# Patient Record
Sex: Male | Born: 1945 | Race: White | Hispanic: No | Marital: Married | Smoking: Former smoker
Health system: Southern US, Community
[De-identification: ages and names within clinical notes are randomized; demographics above are authoritative.]

## PROBLEM LIST (undated history)

## (undated) ENCOUNTER — Emergency Department (HOSPITAL_COMMUNITY): Admission: EM | Payer: Medicare PPO | Source: Home / Self Care

## (undated) DIAGNOSIS — I639 Cerebral infarction, unspecified: Secondary | ICD-10-CM

## (undated) DIAGNOSIS — Z9289 Personal history of other medical treatment: Secondary | ICD-10-CM

## (undated) DIAGNOSIS — I2510 Atherosclerotic heart disease of native coronary artery without angina pectoris: Secondary | ICD-10-CM

## (undated) DIAGNOSIS — I10 Essential (primary) hypertension: Secondary | ICD-10-CM

## (undated) DIAGNOSIS — L409 Psoriasis, unspecified: Secondary | ICD-10-CM

## (undated) DIAGNOSIS — C189 Malignant neoplasm of colon, unspecified: Secondary | ICD-10-CM

## (undated) DIAGNOSIS — E785 Hyperlipidemia, unspecified: Secondary | ICD-10-CM

## (undated) DIAGNOSIS — K5790 Diverticulosis of intestine, part unspecified, without perforation or abscess without bleeding: Secondary | ICD-10-CM

## (undated) HISTORY — DX: Psoriasis, unspecified: L40.9

## (undated) HISTORY — DX: Diverticulosis of intestine, part unspecified, without perforation or abscess without bleeding: K57.90

## (undated) HISTORY — DX: Hyperlipidemia, unspecified: E78.5

## (undated) HISTORY — DX: Personal history of other medical treatment: Z92.89

## (undated) HISTORY — DX: Essential (primary) hypertension: I10

## (undated) HISTORY — DX: Cerebral infarction, unspecified: I63.9

## (undated) HISTORY — DX: Atherosclerotic heart disease of native coronary artery without angina pectoris: I25.10

## (undated) HISTORY — DX: Malignant neoplasm of colon, unspecified: C18.9

---

## 1999-06-21 ENCOUNTER — Ambulatory Visit (HOSPITAL_COMMUNITY): Admission: RE | Admit: 1999-06-21 | Discharge: 1999-06-21 | Payer: Self-pay | Admitting: Family Medicine

## 1999-07-11 ENCOUNTER — Ambulatory Visit (HOSPITAL_COMMUNITY): Admission: RE | Admit: 1999-07-11 | Discharge: 1999-07-11 | Payer: Self-pay | Admitting: *Deleted

## 2001-01-24 ENCOUNTER — Emergency Department (HOSPITAL_COMMUNITY): Admission: EM | Admit: 2001-01-24 | Discharge: 2001-01-24 | Payer: Self-pay | Admitting: *Deleted

## 2001-01-24 ENCOUNTER — Encounter: Payer: Self-pay | Admitting: Emergency Medicine

## 2001-01-27 ENCOUNTER — Encounter: Payer: Self-pay | Admitting: Family Medicine

## 2001-01-27 ENCOUNTER — Encounter: Payer: Self-pay | Admitting: Neurology

## 2001-01-27 ENCOUNTER — Inpatient Hospital Stay (HOSPITAL_COMMUNITY): Admission: AD | Admit: 2001-01-27 | Discharge: 2001-01-28 | Payer: Self-pay | Admitting: Family Medicine

## 2001-01-28 ENCOUNTER — Encounter: Payer: Self-pay | Admitting: Neurology

## 2001-08-12 ENCOUNTER — Encounter (INDEPENDENT_AMBULATORY_CARE_PROVIDER_SITE_OTHER): Payer: Self-pay

## 2001-08-12 ENCOUNTER — Other Ambulatory Visit: Admission: RE | Admit: 2001-08-12 | Discharge: 2001-08-12 | Payer: Self-pay | Admitting: Urology

## 2002-10-28 ENCOUNTER — Observation Stay (HOSPITAL_COMMUNITY): Admission: EM | Admit: 2002-10-28 | Discharge: 2002-10-29 | Payer: Self-pay

## 2002-10-29 ENCOUNTER — Encounter: Payer: Self-pay | Admitting: *Deleted

## 2003-05-09 ENCOUNTER — Encounter (INDEPENDENT_AMBULATORY_CARE_PROVIDER_SITE_OTHER): Payer: Self-pay

## 2003-05-09 ENCOUNTER — Ambulatory Visit (HOSPITAL_COMMUNITY): Admission: RE | Admit: 2003-05-09 | Discharge: 2003-05-09 | Payer: Self-pay | Admitting: Gastroenterology

## 2003-05-23 ENCOUNTER — Ambulatory Visit (HOSPITAL_COMMUNITY): Admission: RE | Admit: 2003-05-23 | Discharge: 2003-05-23 | Payer: Self-pay | Admitting: Family Medicine

## 2003-05-23 ENCOUNTER — Encounter: Payer: Self-pay | Admitting: Family Medicine

## 2005-10-05 ENCOUNTER — Emergency Department (HOSPITAL_COMMUNITY): Admission: EM | Admit: 2005-10-05 | Discharge: 2005-10-06 | Payer: Self-pay | Admitting: Emergency Medicine

## 2006-01-15 ENCOUNTER — Encounter: Payer: Self-pay | Admitting: Cardiology

## 2006-01-22 ENCOUNTER — Ambulatory Visit (HOSPITAL_COMMUNITY): Admission: RE | Admit: 2006-01-22 | Discharge: 2006-01-22 | Payer: Self-pay | Admitting: *Deleted

## 2006-01-22 ENCOUNTER — Encounter: Payer: Self-pay | Admitting: Cardiology

## 2006-07-14 ENCOUNTER — Encounter: Admission: RE | Admit: 2006-07-14 | Discharge: 2006-10-12 | Payer: Self-pay | Admitting: Family Medicine

## 2007-10-21 ENCOUNTER — Encounter: Payer: Self-pay | Admitting: Cardiology

## 2008-06-30 ENCOUNTER — Encounter (INDEPENDENT_AMBULATORY_CARE_PROVIDER_SITE_OTHER): Payer: Self-pay | Admitting: General Surgery

## 2008-06-30 ENCOUNTER — Inpatient Hospital Stay (HOSPITAL_COMMUNITY): Admission: RE | Admit: 2008-06-30 | Discharge: 2008-07-04 | Payer: Self-pay | Admitting: General Surgery

## 2008-07-12 ENCOUNTER — Ambulatory Visit: Payer: Self-pay | Admitting: Hematology and Oncology

## 2008-07-19 LAB — CBC WITH DIFFERENTIAL/PLATELET
EOS%: 1 % (ref 0.0–7.0)
Eosinophils Absolute: 0.1 10*3/uL (ref 0.0–0.5)
HGB: 15.5 g/dL (ref 13.0–17.1)
MCV: 93.2 fL (ref 81.6–98.0)
MONO%: 6.9 % (ref 0.0–13.0)
NEUT#: 5.7 10*3/uL (ref 1.5–6.5)
RBC: 4.82 10*6/uL (ref 4.20–5.71)
RDW: 12.9 % (ref 11.2–14.6)
lymph#: 1.8 10*3/uL (ref 0.9–3.3)

## 2008-07-20 LAB — COMPREHENSIVE METABOLIC PANEL
ALT: 20 U/L (ref 0–53)
Albumin: 4.3 g/dL (ref 3.5–5.2)
CO2: 26 mEq/L (ref 19–32)
Calcium: 9.9 mg/dL (ref 8.4–10.5)
Chloride: 103 mEq/L (ref 96–112)
Glucose, Bld: 96 mg/dL (ref 70–99)
Sodium: 142 mEq/L (ref 135–145)
Total Protein: 7.1 g/dL (ref 6.0–8.3)

## 2008-07-20 LAB — CEA: CEA: 0.8 ng/mL (ref 0.0–5.0)

## 2008-07-27 ENCOUNTER — Ambulatory Visit (HOSPITAL_COMMUNITY): Admission: RE | Admit: 2008-07-27 | Discharge: 2008-07-27 | Payer: Self-pay | Admitting: Hematology and Oncology

## 2009-05-07 ENCOUNTER — Emergency Department (HOSPITAL_COMMUNITY): Admission: EM | Admit: 2009-05-07 | Discharge: 2009-05-07 | Payer: Self-pay | Admitting: Emergency Medicine

## 2009-06-12 ENCOUNTER — Telehealth (INDEPENDENT_AMBULATORY_CARE_PROVIDER_SITE_OTHER): Payer: Self-pay | Admitting: *Deleted

## 2009-06-26 ENCOUNTER — Ambulatory Visit: Payer: Self-pay | Admitting: Cardiology

## 2009-06-26 DIAGNOSIS — G459 Transient cerebral ischemic attack, unspecified: Secondary | ICD-10-CM | POA: Insufficient documentation

## 2009-06-26 DIAGNOSIS — I10 Essential (primary) hypertension: Secondary | ICD-10-CM | POA: Insufficient documentation

## 2009-06-26 DIAGNOSIS — I2510 Atherosclerotic heart disease of native coronary artery without angina pectoris: Secondary | ICD-10-CM | POA: Insufficient documentation

## 2009-06-26 DIAGNOSIS — E785 Hyperlipidemia, unspecified: Secondary | ICD-10-CM | POA: Insufficient documentation

## 2009-06-26 DIAGNOSIS — I6350 Cerebral infarction due to unspecified occlusion or stenosis of unspecified cerebral artery: Secondary | ICD-10-CM | POA: Insufficient documentation

## 2009-06-30 ENCOUNTER — Ambulatory Visit: Payer: Self-pay

## 2009-06-30 ENCOUNTER — Encounter: Payer: Self-pay | Admitting: Cardiology

## 2009-07-03 ENCOUNTER — Encounter: Payer: Self-pay | Admitting: Cardiology

## 2009-07-06 ENCOUNTER — Encounter: Payer: Self-pay | Admitting: Cardiology

## 2009-07-06 ENCOUNTER — Ambulatory Visit: Payer: Self-pay | Admitting: Cardiology

## 2009-07-06 ENCOUNTER — Ambulatory Visit (HOSPITAL_COMMUNITY): Admission: RE | Admit: 2009-07-06 | Discharge: 2009-07-06 | Payer: Self-pay | Admitting: Cardiology

## 2009-09-22 ENCOUNTER — Encounter: Payer: Self-pay | Admitting: Cardiology

## 2009-10-16 ENCOUNTER — Telehealth (INDEPENDENT_AMBULATORY_CARE_PROVIDER_SITE_OTHER): Payer: Self-pay | Admitting: *Deleted

## 2009-10-17 ENCOUNTER — Encounter: Admission: RE | Admit: 2009-10-17 | Discharge: 2009-10-17 | Payer: Self-pay | Admitting: Family Medicine

## 2010-06-11 ENCOUNTER — Encounter: Payer: Self-pay | Admitting: Cardiology

## 2010-06-12 ENCOUNTER — Encounter: Payer: Self-pay | Admitting: Cardiology

## 2010-06-14 ENCOUNTER — Ambulatory Visit: Payer: Self-pay | Admitting: Cardiology

## 2010-06-14 ENCOUNTER — Telehealth: Payer: Self-pay | Admitting: Cardiology

## 2010-07-02 ENCOUNTER — Ambulatory Visit: Payer: Self-pay | Admitting: Cardiology

## 2010-07-03 ENCOUNTER — Telehealth: Payer: Self-pay | Admitting: Cardiology

## 2010-07-10 LAB — CONVERTED CEMR LAB: CO2: 32 meq/L (ref 19–32)

## 2010-11-04 ENCOUNTER — Encounter: Payer: Self-pay | Admitting: Family Medicine

## 2010-11-15 NOTE — Assessment & Plan Note (Signed)
Summary: per check out/sf   Visit Type:  Follow-up Primary Provider:  Shaune Pollack, MD  CC:  no omplaints.  History of Present Illness: 65 yo with history of prior cerebellar CVA, nonobstructive CAD, HTN, hyperlipidemia presents for cardiology followup.  Patient has a history of cerebellar CVA in 2000, documented by MRI.  Symptoms at that time were difficulty with balace and discoordination.  No vertigo at that time.  In 7/10, pt had just gotten dressed for church one Sunday and was not doing anything in particular.  He developed a severe spinning sensation like vertigo that lasted for about 5 minutes.  He was "woozy" for about 1.5 hours after that before finally returning to normal.  He does not remember having the vertigo set off by moving his head in a particular direction.  He went to the ER and had an MRI of his head done, showing only small vessel disease.  There was some concern that this could have been a posterior circulation TIA given prior history of cerebellar CVA.  He did have a TEE in 9/10 showing no PFO, normal LV and RV systolic function, grade III plaque in the aortic arch/descending thoracic aorta.   Patient has had no TIA-like episodes since I saw him last.  He has been doing well.  BP seems to be reasonably controlled, it is 130/80 today.  He is walking 2 miltes 3-4 times a week.  No exertional chest pain or dyspnea.  He recently retired.  ECG:  NSR, normal  Labs (8/10): creatinine 1.0, LDL 73, HDL 58  Current Medications (verified): 1)  Plavix 75 Mg Tabs (Clopidogrel Bisulfate) .... Once Daily 2)  Teveten 600 Mg Tabs (Eprosartan Mesylate) .... Take One Tablet Once Daily 3)  Hydrochlorothiazide 25 Mg Tabs (Hydrochlorothiazide) .... Take One Tablet Once Daily 4)  Norvasc 5 Mg Tabs (Amlodipine Besylate) .... Take One Tablet Once Daily 5)  Toprol Xl 100 Mg Xr24h-Tab (Metoprolol Succinate) .... Take One Tablet Once Daily 6)  Lipitor 10 Mg Tabs (Atorvastatin Calcium) .... Take At  Bedtime 7)  Buffered Aspirin 325 Mg Tabs (Aspirin Buf(Cacarb-Mgcarb-Mgo)) .... Take One Tablet Once Daily  Allergies (verified): No Known Drug Allergies  Past History:  Past Medical History: 1. Hyperlipidemia 2. Cerebellar CVA 2000: documented by MRI.  Possible TIA 7/10 with vertigo.  MRI 7/10 showed only small vessel disease.  Possible TIA 7/10.  Carotid dopplers (9/10) with no significant disease.  TEE (9/10): Normal LV and RV size and systolic function.  No PFO.  No significant valvular disease.  At least grade III plaque in the arch and descending thoracic aorta with no ulceration or mobility.  3. HTN 4. History of colon CA s/p R hemicolectomy in 9/09.  5. psoriasis 6. diverticulosis 7. CAD: nonobstructive.  LHC in 2000 without significant disease.  ETT-cardiolite 2007 with fixed inferior defect.  LHC (4/07) showed EF 60%, 30-40% CFX stenosis, 30-40% mRCA stenosis.  Probably false positive cardiolite.   Family History: Reviewed history from 06/26/2009 and no changes required. No premature CAD.   Social History: Reviewed history from 06/26/2009 and no changes required. 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 Lives in [place].  No smoking since 1975.  Occasional ETOH.  Married with grown children (Dave Grajales, cardiologist in [place]).   Vital Signs:  Patient profile:   64 year old male Height:      73 inches Weight:      210 pounds BMI:     27 .81  Pulse rate:   69 / minute BP sitting:   130 / 80  (left arm) Cuff size:   regular  Vitals Entered By: Burnett Kanaris, CNA (June 14, 2010 8:31 AM)  Physical Exam  General:  Well developed, well nourished, in no acute distress. Neck:  Neck supple, no JVD. No masses, thyromegaly or abnormal cervical nodes. Lungs:  Clear bilaterally to auscultation and percussion. Heart:  Non-displaced PMI, chest non-tender; regular rate and rhythm, S1, S2 without murmurs, rubs.  +soft S4. Carotid  upstroke normal, no bruit.  Pedals normal pulses. No edema, no varicosities. Abdomen:  Bowel sounds positive; abdomen soft and non-tender without masses, organomegaly, or hernias noted. No hepatosplenomegaly. Extremities:  No clubbing or cyanosis. Neurologic:  Alert and oriented x 3. Psych:  Normal affect.   Impression & Recommendations:  Problem # 1:  CAD, NATIVE VESSEL (ICD-414.01) Nonobstructive disease on cath in 4/07.  No ischemic symptoms.  He is on both aspirin and Plavix at this time in setting of history of CVA and nonobstructive CAD.  I do think that he can decrease aspirin to 81 mg daily.   Problem # 2:  HYPERTENSION, UNSPECIFIED (ICD-401.9) BP looks good today.  Mr [facility] wonders if there is an alternative to Teveten as it is expensive.  I will have him try losartan 50 mg daily with BMET and BP check in 2 wks.   Patient Instructions: 1)  Your physician has recommended you make the following change in your medication:  2)  Stop Teveten. 3)  Start Benicar 20mg  daily. 4)  Decrease Aspirin to 81mg  daily--this should be buffered or coated. 5)  Your physician recommends that you return for lab work in: 2 weeks--BMP 414.01 401.9 6)  Your physician has requested that you regularly monitor and record your blood pressure readings at home.  Please use the same machine at the same time of day to check your readings. I will call you in 2 weeks to get the readings. Luana Shu [phone]  7)  Your physician wants you to follow-up in: 1 year with Dr Shirlee Latch.  You will receive a reminder letter in the mail two months in advance. If you don't receive a letter, please call our office to schedule the follow-up appointment. Prescriptions: BENICAR 20 MG TABS (OLMESARTAN MEDOXOMIL) one daily  #30 x 11   Entered by:   Katina Dung, RN, BSN   Authorized by:   Marca Ancona, MD   Signed by:   Katina Dung, RN, BSN on 06/14/2010   Method used:   Electronically to        [facility]. [address].  [phone]* (retail)       [address]       [place], [place]  [address]       Ph: [phone] or [phone]       Fax: [phone]   RxID:   [id number]

## 2010-11-15 NOTE — Progress Notes (Signed)
Summary: B/P readings--07/03/10 / rtn call from yesterday  Phone Note Outgoing Call   Call placed by: Katina Dung, RN, BSN,  July 03, 2010 5:31 PM Call placed to: Patient Summary of Call: B/P readings  Follow-up for Phone Call        attempted to contact pt to get B/P readings--LMTCB Katina Dung, RN, BSN  July 03, 2010 5:32 PM   Additional Follow-up for Phone Call Additional follow up Details #1::        pt rtn call from Thurston Hole from yesterday plz call him at [phone] Omer Jack  July 04, 2010 9:04 AM     Additional Follow-up for Phone Call Additional follow up Details #2::    07/04/10 1008  started new med 9/2 9/2 am 127/88 P- 71  1308 137/78 pulse 78 9/2 9/3 0900 127/82 p-72 1640 126/76 64-p 9/6 0800 128/79 64-P, 1949 103/72 P-89 9/8 1140 123/79 P-69 9/10 0925 127/73, P-69 9/11 1935 137/81 P-80, 2008 128/78 P-85 9/17 0947 136/81 P69, 1683 124/71 P-75 9/21 0747 140/86 P-66  Follow-up by: Claris Gladden RN,  July 04, 2010 10:14 AM   Appended Document: B/P readings--07/03/10 / rtn call from yesterday BP ok.  Continue current meds.   Appended Document: B/P readings--07/03/10 / rtn call from yesterday I talked with pt by telephone  Appended Document: B/P readings--07/03/10 / rtn call from yesterday    Clinical Lists Changes  Medications: Changed medication from LOSARTAN POTASSIUM 50 MG TABS (LOSARTAN POTASSIUM) one daily to LOSARTAN POTASSIUM 50 MG TABS (LOSARTAN POTASSIUM) one daily - Signed Rx of LOSARTAN POTASSIUM 50 MG TABS (LOSARTAN POTASSIUM) one daily;  #90 x 3;  Signed;  Entered by: Katina Dung, RN, BSN;  Authorized by: Marca Ancona, MD;  Method used: Electronically to [facility]. [place]. [phone]*, [address], [place], [place], [place]  [address], Ph: [phone] or [phone], Fax: [phone]    Prescriptions: LOSARTAN POTASSIUM 50 MG TABS (LOSARTAN POTASSIUM) one daily  #90 x 3   Entered by:   Katina Dung, RN, BSN  Authorized by:   Marca Ancona, MD   Signed by:   Katina Dung, RN, BSN on 07/05/2010   Method used:   Electronically to        [facility]. [address] [phone]* (retail)       [address]       [place], [place]  [address]       Ph: [phone] or [phone]       Fax: [phone]   RxID:   [id number]

## 2010-11-15 NOTE — Progress Notes (Signed)
  Walk in Patient Form Recieved " Patient left Records from [facility]" forward to [facility] for her to flag [facility]  October 16, 2009 2:27 PM

## 2010-11-15 NOTE — Letter (Signed)
Summary: BP Record  BP Record   Imported By: Marylou Mccoy 06/26/2010 10:09:14  _____________________________________________________________________  External Attachment:    Type:   Image     Comment:   External Document

## 2010-11-15 NOTE — Progress Notes (Signed)
Summary: calling  regarding meds  Phone Note Call from Patient Call back at Home Phone [phone]   Caller: Patient Summary of Call: Pt calling with question about meds that Dr.Mclean gave him today Initial call taken by: Judie Grieve,  June 14, 2010 12:36 PM    New/Updated Medications: LOSARTAN POTASSIUM 50 MG TABS (LOSARTAN POTASSIUM) one daily Prescriptions: LOSARTAN POTASSIUM 50 MG TABS (LOSARTAN POTASSIUM) one daily  #30 x 6   Entered by:   Katina Dung, RN, BSN   Authorized by:   Marca Ancona, MD   Signed by:   Katina Dung, RN, BSN on 06/14/2010   Method used:   Electronically to        [facility]. [address] [phone]* (retail)       [address]       [place], [place]  [address]       Ph: [phone] or [phone]       Fax: [phone]   RxID:   [id number]  Benicar not covered under his prescription plan-will prescribe Losartan 50mg  daily per Dr Shirlee Latch

## 2010-11-15 NOTE — Medication Information (Signed)
Summary: [facility]   Imported By: Kassie Mends 12/25/2009 13:13:49  _____________________________________________________________________  External Attachment:    Type:   Image     Comment:   External Document

## 2010-11-15 NOTE — Letter (Signed)
Summary: Patient's Medical Information  Patient's Medical Information   Imported By: Marylou Mccoy 06/26/2010 09:04:59  _____________________________________________________________________  External Attachment:    Type:   Image     Comment:   External Document

## 2010-12-19 ENCOUNTER — Other Ambulatory Visit: Payer: Self-pay | Admitting: Family Medicine

## 2010-12-19 DIAGNOSIS — C189 Malignant neoplasm of colon, unspecified: Secondary | ICD-10-CM

## 2010-12-20 ENCOUNTER — Ambulatory Visit
Admission: RE | Admit: 2010-12-20 | Discharge: 2010-12-20 | Disposition: A | Discharge status: Home / Self Care | Payer: Medicare Other | Source: Ambulatory Visit | Attending: Family Medicine | Admitting: Family Medicine

## 2010-12-20 DIAGNOSIS — C189 Malignant neoplasm of colon, unspecified: Secondary | ICD-10-CM

## 2010-12-20 MED ORDER — IOHEXOL 300 MG/ML  SOLN
125.0000 mL | Freq: Once | INTRAMUSCULAR | Status: AC | PRN
  Administered 2010-12-20: 125 mL via INTRAVENOUS

## 2011-01-17 ENCOUNTER — Telehealth: Payer: Self-pay | Admitting: Cardiology

## 2011-01-17 NOTE — Telephone Encounter (Signed)
I talked with pt. His son is a friend of Dr Shirlee Latch.  Pt's mother in law has an appt with [facility] 02/07/11. Pt would like Dr Shirlee Latch to call pt's  wife so she  can discuss her mother's upcoming appt with [facility]. Pt is unsure of the reason his mother in law has been referred to [facility]. I will forward to Dr Shirlee Latch for review.

## 2011-01-17 NOTE — Telephone Encounter (Signed)
Pt calling re an up coming referral

## 2011-01-18 NOTE — Telephone Encounter (Signed)
I attempted to call patient (left message).

## 2011-01-20 LAB — COMPREHENSIVE METABOLIC PANEL
AST: 19 U/L (ref 0–37)
Albumin: 3.7 g/dL (ref 3.5–5.2)
CO2: 27 mEq/L (ref 19–32)
Calcium: 9.3 mg/dL (ref 8.4–10.5)
Creatinine, Ser: 1.21 mg/dL (ref 0.4–1.5)
GFR calc Af Amer: >60 mL/min (ref >60)
Glucose, Bld: 97 mg/dL (ref 70–99)
Total Protein: 7 g/dL (ref 6.0–8.3)

## 2011-01-20 LAB — PROTIME-INR
INR: 1 (ref 0.00–1.49)
Prothrombin Time: 13.6 seconds (ref 11.6–15.2)

## 2011-01-20 LAB — DIFFERENTIAL
Basophils Relative: 1 % (ref 0–1)
Eosinophils Absolute: 0.1 10*3/uL (ref 0.0–0.7)
Monocytes Absolute: 0.5 10*3/uL (ref 0.1–1.0)

## 2011-01-20 LAB — CBC
HCT: 44.8 % (ref 39.0–52.0)
Hemoglobin: 15.6 g/dL (ref 13.0–17.0)
MCHC: 34.8 g/dL (ref 30.0–36.0)
Platelets: 185 10*3/uL (ref 150–400)
RBC: 4.75 MIL/uL (ref 4.22–5.81)
RDW: 13.2 % (ref 11.5–15.5)

## 2011-01-20 LAB — CK TOTAL AND CKMB (NOT AT ARMC)
CK, MB: 3.8 ng/mL (ref 0.3–4.0)
Total CK: 146 U/L (ref 7–232)

## 2011-01-20 LAB — POCT I-STAT, CHEM 8
BUN: 21 mg/dL (ref 6–23)
Chloride: 104 mEq/L (ref 96–112)
Hemoglobin: 15.6 g/dL (ref 13.0–17.0)
Potassium: 3.7 mEq/L (ref 3.5–5.1)
TCO2: 24 mmol/L (ref 0–100)

## 2011-01-20 LAB — GLUCOSE, CAPILLARY: Glucose-Capillary: 99 mg/dL (ref 70–99)

## 2011-01-21 NOTE — Telephone Encounter (Signed)
Dr Shirlee Latch called

## 2011-02-26 NOTE — Discharge Summary (Signed)
[name], Jose Meyer NO.:  [id number]   MEDICAL RECORD NO.:  [id number]          PATIENT TYPE:  INP   LOCATION:  1523                         [facility]AC[facility]   PHYSICIAN:  Juanetta Gosling, MDDATE OF BIRTH:  Jan 20, 1946   DATE OF ADMISSION:  06/30/2008  DATE OF DISCHARGE:  07/04/2008                               DISCHARGE SUMMARY   HISTORY AND HOSPITAL COURSE:  The patient is a 65 year old male who had  a personal history of colon polyps, underwent a colon cancer  surveillance colonoscopy in July 2009 with findings of pedunculated  polyp in the sigmoid colon that was a tubular adenoma.  He also had  transverse colon polyp which was normal looking mucosa.  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  This was unable to be  removed endoscopically.  He was referred for surgery.  He was bowel  prepped and admitted on the 17th and underwent a laparoscopic assisted  right hemicolectomy.  Postoperatively he did well and his diet was  advanced when he began having bowel movements and was doing well with  the wound without any evidence of infection.  Was discharged on  postoperative day 4.   PROCEDURE PERFORMED:  1. Laparoscopic assisted right colectomy.  2  Medications on discharge were Vicodin 5/500, 1 every 4-6 hours as  needed for pain.  On his discharge he was to return to clinic in 1 week  following his discharge.  I did instruct him on increasing his  activities slowly, he could walk up steps, no lifting over 10 pounds for  4 weeks, no driving for 1 week and could shower upon his arrival at his  home as well.      Juanetta Gosling, MD  Electronically Signed     MCW/MEDQ  D:  08/03/2008  T:  08/03/2008  Job:  [id number]

## 2011-02-26 NOTE — Op Note (Signed)
NAMELAYMON, STOCKERT NO.:  [id number]   MEDICAL RECORD NO.:  [id number]          PATIENT TYPE:  INP   LOCATION:  0003                         FACILITY:  [facility]   PHYSICIAN:  Juanetta Gosling, MDDATE OF BIRTH:  [birth year]   DATE OF PROCEDURE:  06/30/2008  DATE OF DISCHARGE:                               OPERATIVE REPORT   PREOPERATIVE DIAGNOSIS:  Right colon polyp unable to be resected  endoscopically with pathology showing a tubulovillous adenoma.   POSTOPERATIVE DIAGNOSIS:  Right colon polyp unable to be resected  endoscopically with pathology showing a tubulovillous adenoma.   PROCEDURE:  Laparoscopic assisted right hemicolectomy.   SURGEON:  Juanetta Gosling, MD.   ASSISTANT:  Leonie Man, MD.   ANESTHESIA:  General.   FINDINGS:  The ink tattoo was seen well on the right colon and the  lesion was confirmed to be removed by pathology with at least 9 cm  margins on either side.   SPECIMENS:  Right colon to pathology.   ESTIMATED BLOOD LOSS:  Minimal.   COMPLICATIONS:  None.   DRAINS:  None.   DISPOSITION:  The patient extubated in the operating room and to the  PACU in stable condition.   INDICATIONS:  Mr. Remmert is a 65 year old male with a personal  history of polyps who underwent a screening colonoscopy in July of 2009  with findings of a pedunculated polyp in the sigmoid colon that was  removed with a hot snare that was a tubular adenoma.  In his ascending  colon he had a 25 mm size carpet like polyp which biopsies were taken  with cold forceps.  This was not able to resected endoscopically.  The  biopsy results were tubulovillous adenoma and he has been referred by  Dr. Evette Cristal for right hemicolectomy for an unresectable polyp which we  were planning on doing laparoscopically.   PROCEDURE IN DETAIL:  After informed consent was obtained and after he  underwent a colonoscopy the day prior to ink of the lesion Mr. Gullion  was  taken to the operating room after being administered 1 gram of  Invanz.  He then was placed under general endotracheal anesthesia  without complication.  He was then placed in the lithotomy position and  appropriately padded.  His abdomen was then prepped and draped in a  standard sterile surgical fashion.  A surgical time-out was then  performed.  Due to his umbilical hernia repair I made a 10 mm  supraumbilical incision that I carried out down to the level of the  fascia which was entered sharply.  The peritoneum was then entered  bluntly with a finger.  Vicryl pursestring suture was then placed around  the fascia and a small trocar was then inserted.  The abdomen was  insufflated to 15 mmHg pressure.  Upon entering the abdomen the tattoo  on his right colon was noted immediately.  I then placed a 5 mm trocar  after infiltration with local anesthetic without complication in the  suprapubic region.  Further right lower quadrant trocar was then placed  in the same fashion.  The ileum and cecum and appendix were then  identified.  The Harmonic scalpel was used to roll the cecum and ileum  medially, taking down the colon at the white line of Toldt.  The ureter  was identified throughout its course during this portion of the  operation.  The colon was continued to roll medially and taken down to  the white line of Toldt until the duodenum was identified.  The colon at  that point was medialized.  I then placed a further 5 mm port in his  left upper quadrant and used the Harmonic scalpel to remove the omentum  from his transverse colon just past the cephalad hepatic flexure.  The  Harmonic scalpel was then used to free all the lateral attachments from  the hepatic flexure and this was then rolled up into the wound.  It was  very clear at this point that the tattoo was in the ascending colon and  we would have plenty of distal margin doing a regular right  hemicolectomy.  After the colon had  been medialized I then placed a,  made an incision approximately 8 cm using my prior Hasson port.  This  was then opened into the abdomen.  I then placed a GelPort, the wound  protector first and brought the specimen out through this.  First the  mesentery was then taken with the LigaSure.  The ileocolic was taken  with sutures.  The right colic was then taken with a 2-0 stitch as well  as a 2-0 tie.  Enterotomies were made in the small bowel near the veil  of Treves and in the colon.  A GIA 75 stapler was then passed down  through this and fired.  A ring forceps was inserted.  This was noted to  be hemostatic and was patent.  A TA stapler was then placed across the  common enterotomy and this was then stapled closed and the specimen was  passed off.  Hemostasis was then obtained with a combination of cautery  and a couple of Lembert sutures.  The mesenteric defect was then  identified and closed with a 2-0 Vicryl suture.  This was noted to be  hemostatic.  The colon was then dropped back into the abdomen.  The  GelPort was then placed.  The abdomen was reinsufflated with good  appearance of the anastomosis and no evidence of any bleeding intra-  abdominally.  At this point we removed the GelPort, desufflated the  abdomen and removed all our other trocars as well.  The midline  abdominal wound was then closed with a zero looped PDS.  The skin was  then stapled in this position.  All the other trocar sites were closed  with Monocryl in a subcuticular fashion.  Dermabond was placed on these.  Sterile dressing was placed over the abdominal wound.  He was extubated  in the operating room having tolerated this well and was transferred to  the recovery room.      Juanetta Gosling, MD  Electronically Signed     MCW/MEDQ  D:  06/30/2008  T:  07/02/2008  Job:  [id number]   cc:   Graylin Shiver, M.D.  Fax: [phone]   Petra Kuba, M.D.  Fax: [phone]

## 2011-03-01 NOTE — Op Note (Signed)
   NAME:  Jose Meyer, Jose Meyer                       ACCOUNT NO.:  [id number]   MEDICAL RECORD NO.:  [id number]                   PATIENT TYPE:  AMB   LOCATION:  ENDO                                 [facility]AC[facility]   PHYSICIAN:  Graylin Shiver, M.D.                DATE OF BIRTH:  Apr 05, 1946   DATE OF PROCEDURE:  05/09/2003  DATE OF DISCHARGE:                                 OPERATIVE REPORT   PROCEDURE:  Colonoscopy with polypectomy.   SURGEON:  Graylin Shiver, M.D.   INDICATIONS FOR PROCEDURE:  Screening.   CONSENT:  Informed consent was obtained after explanation of the risks of  bleeding, infection and perforation.   PREMEDICATION:  Fentanyl 75 mcg IV and Versed 6 mg IV.   PROCEDURE:  With the patient in the left lateral decubitus position a rectal  exam was performed and no masses were felt.  The Olympus colonoscope was  inserted into the rectum and advanced around the colon to the cecum.  The  cecal landmarks were identified.  The cecum and ascending colon were normal.  The transverse colon was normal.  In the proximal descending colon there was  a 5 mm sessile polyp which was snared using snare cautery technique.  The  cautery site looked good and the polyp was retrieved.  The sigmoid and  rectum looked normal.  He tolerated the procedure well without  complications.   IMPRESSION:  Small polyp in the proximal descending colon which was removed.                                               Graylin Shiver, M.D.    SFG/MEDQ  D:  05/09/2003  T:  05/09/2003  Job:  [id number]   cc:   [place] [address]  [address] [place]  [place] [address]  Fax: [phone]

## 2011-03-01 NOTE — H&P (Signed)
[place]. [facility]  Patient:    MYSON, LEVI SR                 MRN: [id number] Adm. Date:  [id number] Attending:  Hollice Espy CC:         Duncan Dull, M.D.   History and Physical  CHIEF COMPLAINT:  Staggering and abnormal MRI.  HISTORY OF PRESENT ILLNESS:  Mr. Araceli Bouche is a 65 year old right-handed white male with a history of hypertension.  Friday night, he was a wedding rehearsal dinner.  He had sudden onset of a staggering gait, felt like he was drunk, but he had had no alcohol.  He noted slight clumsiness, mild right posterior headache, mild blurring of his vision; no slurred speech, dysphagia, double vision, weakness or numbness.  He attributed it to lack of sleep but when he woke up in the morning, he still had some residual symptoms.  His son, who is a physician, thought he had some cerebellar findings and took him to [facility] ER where his exam was essentially normal.  He had a CT scan of the brain and was normal.  He followed up with his primary physician yesterday and was put on Plavix, I think from the ER.  MRI was ordered for today.  He has had several smaller similar spells since the original spell on Friday.  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  REVIEW OF SYSTEMS:  Review of systems negative for chest pain, shortness of breath, nausea or vomiting.  PAST MEDICAL HISTORY:  Past medical history is significant for hypertension. He is status post right hip surgery, right shoulder surgery for a rotator cuff injury, umbilical hernia surgery.  He apparently has had diverticulitis and has had coronary artery disease and has seen Dr. Meade Maw apparently for this in the past, although the patient does not volunteer this history to me.  MEDICATIONS:  He takes Toprol-XL, Hytrin, Teveten and was put on Plavix since initial events.  ALLERGIES:  No  known drug allergies.  SOCIAL HISTORY:  Smoked at age 46.  Rare alcohol.  Married with children.  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  FAMILY HISTORY:  Family history is positive for TIAs in his mother; father had carotid stenosis.  PHYSICAL EXAMINATION:  VITAL SIGNS:  Temperature is 97.0, pulse 59, respirations 20 with a 99% saturation, blood pressure is 151/77.  HEENT:  Head is normocephalic, atraumatic.  NECK:  Neck is supple without bruits.  HEART:  Heart regular rate and rhythm.  LUNGS:  Lungs clear to auscultation.  ABDOMEN:  Positive bowel sounds.  Soft, nontender.  EXTREMITIES:  Extremities without edema.  NEUROLOGIC:  Mental status:  He is awake and alert, fully oriented.  Normal language and comprehension.  Cranial nerves:  Pupils are equal and reactive. Disk margins are sharp.  Visual fields are full to confrontation.  Extraocular movements are intact.  Facial sensation is normal.  Facial motor activity is normal.  Hearing is intact.  Palate is symmetric and tongue is midline.  On motor exam, he has normal bulk, tone and strength throughout for 5/5, all four extremities.  No tremor.  No atrophy.  No fasciculations.  No satelliting.  No drift.  Normal rapid fine movement.  Reflexes are 2+ and symmetric with downgoing toes.  Coordination:  Finger-to-nose, rapid alternating movements, heel-to-shin and tandem gait are  all normal.  Sensory exam essentially normal except for a small patch of dysesthesia in the right lateral calf.  IMAGING STUDY:  MRI scan of the brain shows a patchy acute stroke in the right posterior-inferior cerebellar artery distribution in the cerebellum.  No other significant disease.  MR angiogram to my view looks quite good.  I do not really see any vertebral or basilar disease.  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  This may have been artery-to-artery  versus cardiac source.  PLAN:  Will admit for heparin IV while we check a 2-D echo and carotid Dopplers and he can presumably be discharged home on Plavix if everything is normal, as his exam is normal at this time.  Will take precautions now with IV heparin and oxygen and fluids until more data is available, however. DD: 01/27/01 TD:  01/28/01 Job: [id number]

## 2011-03-01 NOTE — Cardiovascular Report (Signed)
NAMEAASIR, DAIGLER NO.:  [id number]   MEDICAL RECORD NO.:  [id number]          PATIENT TYPE:  OIB   LOCATION:  2853                         FACILITY:  [facility]   PHYSICIAN:  Meade Maw, M.D.    DATE OF BIRTH:  [birth year]   DATE OF PROCEDURE:  DATE OF DISCHARGE:                              CARDIAC CATHETERIZATION   REFERRING PHYSICIAN:  Shaune Pollack, MD   INDICATION FOR PROCEDURE:  Noncritical coronary artery disease in 2000,  recent stress Cardiolite revealing inferior basilar apical defect with  partial reversal, decrease in exercise capacity and nondiagnostic ST  changes.   PROCEDURE:  After obtaining written informed consent, the patient was  brought to the cardiac catheterization lab in a postabsorptive state.  Preop  sedation was achieved using Versed 3 mg IV.  The right groin was prepped and  draped in the usual sterile fashion.  Local anesthesia was achieved using 1%  Xylocaine.  A 6-French hemostasis sheath was placed into the right femoral  artery using the modified Seldinger technique.  Selective coronary  angiography was performed using a JL-4 and JR-4 Judkins catheter.  Multiple  views were obtained.  All catheter exchanges were made over a guidewire.  Single-plane ventriculogram was performed in the RAO position.  Single-plane  ventriculogram was performed using a 6-French straight pigtail curved  catheter.  All catheter exchanges were made over a guidewire.  The  hemostasis sheath was flushed following each catheter exchange.   FINDINGS:  His aortic pressure is 141/91, LV pressure is 140/0, his EDP is  6.  His single-plane ventriculogram revealed normal wall motion, ejection  fraction of 60%, there was no significant calcification noted on fluoroscopy  and there was no mitral regurgitation noted.   CORONARY ANGIOGRAPHY:  The left main was short and had a conjoined takeoff  of the LAD and circumflex.  There was no disease noted in the left  main  coronary artery.   Left anterior descending:  Left anterior descending gives rise to trivial D-  1 and D-2, small D-3 and small D-4 and goes on to end as an apical branch.  There is a 30% lesion noted in the proximal LAD.   Circumflex vessel:  Circumflex vessel is a large caliber vessel difficult to  visualize secondary to the conjoined takeoff.  He was demonstrated to have  trivial OM-1, a small to moderate OM-2, goes on to end as a posterolateral  branch.  There was a 30-40% proximal lesion noted in the circumflex.   Right coronary artery:  Right coronary artery is dominant for the posterior  circulation and gives rise to the PDA and PL branch.  There are three RV  marginals.  The right coronary artery demonstrates diffuse luminal  irregularities with most severity of 30-40% in the mid portion.   FINAL IMPRESSION:  1.  False positive Cardiolite.  2.  Noncritical coronary artery disease without progression  3.  Normal systolic function, ejection fraction of 60%, no mitral      regurgitation is noted.      Meade Maw, M.D.  Electronically Signed  HP/MEDQ  D:  01/22/2006  T:  01/22/2006  Job:  [id number]

## 2011-03-01 NOTE — Discharge Summary (Signed)
[place]. [facility]  Patient:    Jose Meyer, Jose Meyer                 MRN: [id number] Adm. Date:  [id number] Disc. Date: [id number] Attending:  Durel Salts CC:         Duncan Dull, M.D.   Discharge Summary  DATE OF BIRTH:  11/23/1945  ADMISSION DIAGNOSES: 1. 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 2. Dizziness secondary to #1, resolved. 3. Headache secondary to #1, resolved.  DISCHARGE DIAGNOSES: 1. 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 2. Dizziness secondary to #1, resolved. 3. Headache secondary to #1, resolved.  CONDITION ON DISCHARGE:  Good.  DIET:  Low salt, low fat, low cholesterol.  ACTIVITY:  Ad lib.  DISCHARGE MEDICATIONS: 1. Aspirin 325 mg p.o. q.d. 2. Plavix 75 mg p.o. q.d. 3. Toprol XL 50 mg p.o. q.d. 4. Hytrin 10 mg p.o. q.d. 5. Teveten 600 mg p.o. q.d. 6. Pravachol 20 mg p.o. q.d.  The patient is to discuss with his primary    physician before starting this medication.  PROCEDURES: 1. MRI of the brain performed on January 27, 2001.  Final result pending.    Preliminary result indicates multiple small strokes in the right    inferior cerebellum in the posteroinferior cerebral artery distribution    with no acute stroke in the brainstem.  Otherwise, minimal small-vessel    disease. 2. Intracranial MR angiogram performed January 27, 2001, revealing no critical    basilar disease, and mild disease in the right posteroinferior cerebellar    artery and the left superior cerebellar artery. 3. MR angiogram of the neck.  Final report pending at discharge.  Preliminary    report is normal, although there may be some mild right vertebral disease    by my review. 4. Transthoracic echocardiogram performed January 28, 2001, revealing normal    left ventricular systolic function with ejection fraction 55-65%, and no    regional wall motion  abnormalities. 5. Carotid Doppler performed January 28, 2001, revealing anterior grade    vertebral flow, and no significant ICA stenoses. 6. Lipid panel performed January 28, 2001, revealing total cholesterol of 161,    HDL 62, triglycerides 66, and calculated LDL of 86. 7. CBC, CMET, coagulations, which were unremarkable.  HISTORY OF PRESENT ILLNESS:  Please see Dr. Murvin Donning admission H&P for full admission details.  Briefly, this is a 65 year old gentleman with hypertension who suffered the acute onset of dizziness and mild headache four days prior to admission, which lasted for about a day.  He was seen in the ER the day after the event, had a normal CT scan.  He had an MRI scan on the evening of admission which revealed his acute stroke, and he was subsequently admitted.  HOSPITAL COURSE:  He was placed on heparin for concern of an embolic event. Routine stroke studies were performed, with the above results.  MRA of the neck was also performed to rule out proximal vertebral disease, with the above results.  The patient had a normal neurological examination at admission and remained normal at discharge.  He had no complaints in the hospital.  The heparin was tapered, and he was discharged on the above medication regimen. Of note, he was given a prescription for Pravachol 20 mg q.h.s.  I discussed with him the recent literature suggesting that there is a benefit in secondary stroke  prevention for statin drugs, even in patients with normal serum cholesterol levels.  However, I did advise him to discuss with his primary physician before starting on this medication.  He is to follow up with his primary physician, Dr. Shaune Pollack, in one week and will follow up with [facility] as needed. DD:  01/28/01 TD:  01/29/01 Job: [id number]

## 2011-03-01 NOTE — Discharge Summary (Signed)
NAME:  Jose Meyer, Jose Meyer                       ACCOUNT NO.:  [id number]   MEDICAL RECORD NO.:  [id number]                   PATIENT TYPE:  OBV   LOCATION:  3014                                 FACILITY:  [facility]   PHYSICIAN:  Duncan Dull, M.D.                DATE OF BIRTH:  [birth year]   DATE OF ADMISSION:  10/28/2002  DATE OF DISCHARGE:  10/29/2002                                 DISCHARGE SUMMARY   DISCHARGE DIAGNOSES:  1. Transient dizziness.  2. Hypertension.  3. Hypercholesterolemia.  4. Hypertension.  5. Cerebrovascular accident in April, 2002.  MRI with multiple right     cerebellar cerebrovascular accidents and right posterior ICA     distribution.   DISCHARGE MEDICATIONS:  1. Plavix 75 mg daily.  2. Teveten 600 mg daily.  3. Hydrochlorathiazide 25 mg daily.  4. Aspirin 325 mg daily.  5. Toprol 100 mg at bedtime.  6. Norvasc 5 mg at bedtime.  7. Pravachol 10 at bedtime.   CONSULTATIONS:  None this hospitalization.   PROCEDURE:  MRI of his head on 10/29/02 - no acute intracranial abnormality.  MRA revealed patent major intracranial arterial branches, question disease  to right PICA.  CT of the head without contrast 10/28/02 compared to  01/24/01 - no acute intracranial abnormality nor evidence of hemorrhage,  hydrocephalus tumor, vascular lesion, acute infarction or significant  intracranial injury.  A 12-lead EKG October 28, 2002 - normal sinus rhythm.   LABORATORY DATA:  WBC 5.7, RBC 5, hemoglobin 15.5, hematocrit 42.5, MCV  90.4, platelets 211, sodium 141, potassium 3.5, chloride 105, CO2 30,  glucose 86, BUN 23, creatinine 1.4, PT 12.6, INR 0.9,  aPPT 24, CK 137, CK-  MB 3.1, troponin less than 0.01.   DISPOSITION:  Patient will be discharged home.   CONDITION ON DISCHARGE:  Stable.   HISTORY OF PRESENT ILLNESS:  A 65 year old man with a history of CVA in the  right post circulatory region in April, 2002 with no residual defects.  The  patient at  presentation complained of headache and temporary dizziness.  The  patient reported after flying from [place] to [place] he experienced a  posterior headache with a sensation of dizziness and trouble focusing.  He  denies any nausea, vomiting, dysarthria, numbness, weakness, no chest pain  or shortness of breath.  The patient reported that his symptoms lasted a  couple minutes when he presented to the ED for evaluation.  The patient was  admitted for further evaluation and treatment.    HOSPITAL COURSE:  1. Transient dizziness in a patient with a history of post circulatory CVA     with similar symptoms in the past.  Patient was admitted, continued on     his aspirin and Plavix.  MRA and MRI were done with no acute changes     noted, CT which revealed no acute abnormalities.  The patient had  no     neurological deficits.  His gait was normal.  The patient was discharged     on 10/29/02.  2. Hypertension.  Blood pressure was controlled during this hospitalization.     He was discharged on his previous medication regimen.   FOLLOW UP:  The patient should follow up with his primary care physician,  Dr. Shaune Pollack, in approximately one to two weeks.     Stephanie [place]azil[place], NP                      Duncan Dull, M.D.    SJ/MEDQ  D:  10/31/2002  T:  11/01/2002  Job:  [id number]

## 2011-04-23 ENCOUNTER — Other Ambulatory Visit: Payer: Self-pay | Admitting: Dermatology

## 2011-06-26 ENCOUNTER — Other Ambulatory Visit: Payer: Self-pay | Admitting: *Deleted

## 2011-06-26 MED ORDER — LOSARTAN POTASSIUM 50 MG PO TABS: 50.0000 mg | ORAL_TABLET | Freq: Every day | ORAL | Status: DC

## 2011-07-01 ENCOUNTER — Encounter: Payer: Self-pay | Admitting: Cardiology

## 2011-07-02 ENCOUNTER — Encounter: Payer: Self-pay | Admitting: Cardiology

## 2011-07-02 ENCOUNTER — Ambulatory Visit (INDEPENDENT_AMBULATORY_CARE_PROVIDER_SITE_OTHER): Payer: Medicare Other | Admitting: Cardiology

## 2011-07-02 DIAGNOSIS — I6350 Cerebral infarction due to unspecified occlusion or stenosis of unspecified cerebral artery: Secondary | ICD-10-CM

## 2011-07-02 DIAGNOSIS — I2510 Atherosclerotic heart disease of native coronary artery without angina pectoris: Secondary | ICD-10-CM

## 2011-07-02 DIAGNOSIS — E785 Hyperlipidemia, unspecified: Secondary | ICD-10-CM

## 2011-07-02 DIAGNOSIS — I639 Cerebral infarction, unspecified: Secondary | ICD-10-CM | POA: Insufficient documentation

## 2011-07-02 DIAGNOSIS — I10 Essential (primary) hypertension: Secondary | ICD-10-CM

## 2011-07-02 NOTE — Assessment & Plan Note (Signed)
LDL is at goal (< 70).  Continue current atorvastatin regimen.  He is following a vegetarian diet and has lost considerable weight.

## 2011-07-02 NOTE — Assessment & Plan Note (Signed)
Nonobstructive CAD on last cath in 2007.  No ischemic symptoms.  He is on a good secondary prevention regimen.  Continue ASA, Plavix, ARB, Toprol XL, and statin.  I would suggest that he get more regular with exercise.

## 2011-07-02 NOTE — Assessment & Plan Note (Signed)
H/o CVA/TIA in the past.  He will continue on Plavix and ASA 81 mg daily.  No recent neurological symptoms.

## 2011-07-02 NOTE — Progress Notes (Signed)
PCP: Dr. Kevan Ny  65 yo with history of prior cerebellar CVA, nonobstructive CAD, HTN, hyperlipidemia presents for cardiology followup.  Patient has a history of cerebellar CVA in 2000, documented by MRI.  Symptoms at that time were difficulty with balace and discoordination.  No vertigo at that time.  In 7/10, pt had just gotten dressed for church one Sunday and was not doing anything in particular.  He developed a severe spinning sensation like vertigo that lasted for about 5 minutes.  He was "woozy" for about 1.5 hours after that before finally returning to normal.  He does not remember having the vertigo set off by moving his head in a particular direction.  He went to the ER and had an MRI of his head done, showing only small vessel disease.  There was some concern that this could have been a posterior circulation TIA given prior history of cerebellar CVA.  He did have a TEE in 9/10 showing no PFO, normal LV and RV systolic function, grade III plaque in the aortic arch/descending thoracic aorta.  Patient has known nonobstructive CAD from cath in 2007.   Patient has had no TIA-like episodes since I saw him last.  He has been doing well.  Blood pressure has been well-controlled at home, running in the 110s-120s systolic.  He has lost about 25 lbs recently on a vegetarian diet.  Lipids were excellent when last checked.  No exertional chest pain or dyspnea.  He is able to walk 2 miles with no problems though he has not been very regular with exercise.   Mr Wray did have a CT done recently as screening for metastatic colon cancer (none was found).  This showed coronary calcification.  He does, as noted above, have known nonobstructive CAD.   ECG:  NSR, LAFB  Labs (8/10): creatinine 1.0, LDL 73 Labs (9/12): K 4.1, creatinine 0.83, LDL 53, HDL 42, TGs 131  Allergies (verified):  No Known Drug Allergies  Past Medical History: 1. Hyperlipidemia 2. Cerebellar CVA 2000: documented by MRI.  Possible  TIA 7/10 with vertigo.  MRI 7/10 showed only small vessel disease.  Possible TIA 7/10.  Carotid dopplers (9/10) with no significant disease.  TEE (9/10): Normal LV and RV size and systolic function.  No PFO.  No significant valvular disease.  At least grade III plaque in the arch and descending thoracic aorta with no ulceration or mobility.  3. HTN 4. History of colon CA s/p R hemicolectomy in 9/09.  5. psoriasis 6. diverticulosis 7. CAD: nonobstructive.  LHC in 2000 without significant disease.  ETT-cardiolite 2007 with fixed inferior defect.  LHC (4/07) showed EF 60%, 30-40% CFX stenosis, 30-40% mRCA stenosis.  Probably false positive cardiolite.   Family History: No premature CAD.   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 Lives in [place].  No smoking since 1975.  Occasional ETOH.  Married with grown children [facility], cardiologist in [place]).   Current Outpatient Prescriptions  Medication Sig Dispense Refill  . amLODipine (NORVASC) 5 MG tablet Take 5 mg by mouth daily.        Marland Kitchen aspirin 81 MG tablet Take 81 mg by mouth daily.        Marland Kitchen atorvastatin (LIPITOR) 10 MG tablet Take 10 mg by mouth daily.        . clopidogrel (PLAVIX) 75 MG tablet Take 75 mg by mouth daily.        . hydrochlorothiazide (HYDRODIURIL) 25 MG  tablet Take 25 mg by mouth daily.        Marland Kitchen losartan (COZAAR) 50 MG tablet Take 1 tablet (50 mg total) by mouth daily.  90 tablet  3  . metoprolol (TOPROL-XL) 100 MG 24 hr tablet Take 100 mg by mouth daily.          BP 104/68  Pulse 87  Resp 18  Ht 6\' 2"  (1.88 m)  Wt 205 lb 12.8 oz (93.35 kg)  BMI 26.42 kg/m2 General: NAD Neck: No JVD, no thyromegaly or thyroid nodule.  Lungs: Clear to auscultation bilaterally with normal respiratory effort. CV: Nondisplaced PMI.  Heart regular S1/S2, no S3/S4, no murmur.  No peripheral edema.  No carotid bruit.  Normal pedal pulses.  Abdomen: Soft, nontender, no  hepatosplenomegaly, no distention.  Neurologic: Alert and oriented x 3.  Psych: Normal affect. Extremities: No clubbing or cyanosis.

## 2011-07-02 NOTE — Assessment & Plan Note (Signed)
BP has been well-controlled.  Continue current regimen.

## 2011-07-03 ENCOUNTER — Other Ambulatory Visit: Payer: Self-pay | Admitting: Dermatology

## 2011-07-15 LAB — BASIC METABOLIC PANEL
CO2: 30
GFR calc Af Amer: >60
GFR calc non Af Amer: >60
Glucose, Bld: 125 — ABNORMAL HIGH
Potassium: 3.3 — ABNORMAL LOW
Sodium: 135

## 2011-07-15 LAB — CBC
HCT: 43.3
Hemoglobin: 14.9
RBC: 4.58
RDW: 12.8

## 2011-07-17 LAB — CBC
Hemoglobin: 16.1
MCHC: 34
MCV: 95.3
RDW: 13.1

## 2011-07-17 LAB — COMPREHENSIVE METABOLIC PANEL
ALT: 33
AST: 25
Albumin: 3.9
Alkaline Phosphatase: 60
Chloride: 106
GFR calc Af Amer: >60
Potassium: 4
Total Bilirubin: 1

## 2011-07-17 LAB — DIFFERENTIAL
Basophils Absolute: 0
Basophils Relative: 1
Eosinophils Relative: 2
Monocytes Absolute: 0.5

## 2011-07-19 ENCOUNTER — Encounter: Payer: Self-pay | Admitting: Cardiology

## 2011-09-12 NOTE — Telephone Encounter (Signed)
Please close enounter 

## 2011-09-25 ENCOUNTER — Other Ambulatory Visit: Payer: Self-pay | Admitting: Cardiology

## 2012-03-11 ENCOUNTER — Other Ambulatory Visit: Payer: Self-pay | Admitting: Dermatology

## 2012-03-25 ENCOUNTER — Other Ambulatory Visit: Payer: Self-pay | Admitting: Family Medicine

## 2012-03-25 DIAGNOSIS — C189 Malignant neoplasm of colon, unspecified: Secondary | ICD-10-CM

## 2012-03-26 ENCOUNTER — Ambulatory Visit
Admission: RE | Admit: 2012-03-26 | Discharge: 2012-03-26 | Disposition: A | Discharge status: Home / Self Care | Payer: Medicare Other | Source: Ambulatory Visit | Attending: Family Medicine | Admitting: Family Medicine

## 2012-03-26 ENCOUNTER — Other Ambulatory Visit: Payer: Self-pay | Admitting: Family Medicine

## 2012-03-26 DIAGNOSIS — C189 Malignant neoplasm of colon, unspecified: Secondary | ICD-10-CM

## 2012-03-26 MED ORDER — IOHEXOL 300 MG/ML  SOLN
125.0000 mL | Freq: Once | INTRAMUSCULAR | Status: AC | PRN
  Administered 2012-03-26: 125 mL via INTRAVENOUS

## 2012-06-03 ENCOUNTER — Other Ambulatory Visit: Payer: Self-pay | Admitting: Gastroenterology

## 2012-06-14 ENCOUNTER — Telehealth: Payer: Self-pay | Admitting: Physician Assistant

## 2012-06-14 NOTE — Telephone Encounter (Signed)
Patient called with c/o new onset palpitations since Fri. Has also noted upward trend of BP to 130-140 systolic range. Denies any rapid increase in HR, but noticeable "thumping". Denies and exertional CP, or new onset DOE.  I advised him to increase Toprol XL from 100 mg daily to a total of 150 mg daily, with the additional 50 mg to be taken in the evening, first dose now. He is to then contact our office on Tues, 9/3, to arrange f/u Dr Shirlee Latch. He was last seen in 9/12. He is also to call us for any worsening of his sxs, or development of angina pectoris.  He was appreciative of the call and recommendation, and had already planned to contact our office this Tues for a scheduled followup visit.

## 2012-06-16 ENCOUNTER — Telehealth: Payer: Self-pay | Admitting: Cardiology

## 2012-06-16 NOTE — Telephone Encounter (Signed)
Spoke with pt. Pt states Friday night 06/12/12 he experienced episode of fast heart beat into the mid-90s. This lasted about 1 -1 1/2 hours. Heart rate was 66 the next morning. Pt states he was just resting in bed when this happened. Pt states he started experiencing skipped heart beat about the same time. His son is a cardiologist and he talked with him about this. Pt states his son thinks he may be having PVCs. Pt has not had any other symptoms.

## 2012-06-16 NOTE — Telephone Encounter (Signed)
I have given pt an appt to see Jose Meyer 06/17/12 at 8:50am. Pt is agreeable with this plan.

## 2012-06-16 NOTE — Telephone Encounter (Signed)
Spoke with pt. Pt aware Dr Shirlee Latch will see him tomorrow at time of visit with Tereso Newcomer.

## 2012-06-16 NOTE — Telephone Encounter (Signed)
Overbook him to see me this week.  I talked to his son.

## 2012-06-16 NOTE — Telephone Encounter (Signed)
Dr Shirlee Latch aware pt has an appt with Lorin Picket 06/17/12.

## 2012-06-16 NOTE — Telephone Encounter (Signed)
New problem:  Patient called the on call service over the weekend with c/o pvc. Heart racing. Patient is asking to be seen today or tomorrow. Offer an appt on 9/5 with PA pt refuse wanted to be work in on his schedule.

## 2012-06-16 NOTE — Telephone Encounter (Signed)
Pt wanted to wait until Dr Shirlee Latch was in the office.

## 2012-06-17 ENCOUNTER — Encounter: Payer: Self-pay | Admitting: Physician Assistant

## 2012-06-17 ENCOUNTER — Ambulatory Visit (INDEPENDENT_AMBULATORY_CARE_PROVIDER_SITE_OTHER): Payer: Medicare Other | Admitting: Physician Assistant

## 2012-06-17 VITALS — BP 128/76 | HR 70 | Ht 73.0 in | Wt 208.8 lb

## 2012-06-17 DIAGNOSIS — I10 Essential (primary) hypertension: Secondary | ICD-10-CM

## 2012-06-17 DIAGNOSIS — R002 Palpitations: Secondary | ICD-10-CM

## 2012-06-17 DIAGNOSIS — I2510 Atherosclerotic heart disease of native coronary artery without angina pectoris: Secondary | ICD-10-CM

## 2012-06-17 LAB — CBC WITH DIFFERENTIAL/PLATELET
Basophils Relative: 0.7 % (ref 0.0–3.0)
Eosinophils Absolute: 0.1 10*3/uL (ref 0.0–0.7)
Lymphocytes Relative: 26.3 % (ref 12.0–46.0)
MCHC: 33.4 g/dL (ref 30.0–36.0)
Neutrophils Relative %: 62.1 % (ref 43.0–77.0)
Platelets: 209 10*3/uL (ref 150.0–400.0)
RBC: 5.02 Mil/uL (ref 4.22–5.81)
WBC: 6.1 10*3/uL (ref 4.5–10.5)

## 2012-06-17 LAB — TSH: TSH: 1.29 u[IU]/mL (ref 0.35–5.50)

## 2012-06-17 LAB — BASIC METABOLIC PANEL
CO2: 28 mEq/L (ref 19–32)
Calcium: 9.6 mg/dL (ref 8.4–10.5)
Creatinine, Ser: 0.9 mg/dL (ref 0.4–1.5)

## 2012-06-17 LAB — MAGNESIUM: Magnesium: 2.2 mg/dL (ref 1.5–2.5)

## 2012-06-17 NOTE — Progress Notes (Signed)
[address] [place], [place]  [address] Phone: [phone] Fax:  [phone]  Date:  06/17/2012   Name:  Jose Meyer   DOB:  Jun 08, 1946   MRN:  [id number]  PCP:  Hollice Espy, MD  Primary Cardiologist:  Dr. Marca Ancona  Primary Electrophysiologist:  None    History of Present Illness: Jose Meyer is a 66 y.o. male who returns for evaluation of palpitations.  He has a history of prior cerebellar CVA, nonobstructive CAD, HTN, HL.  He has a history of cerebellar CVA in 2000, documented by MRI. Symptoms at that time were difficulty with balace and discoordination. No vertigo at that time. In 7/10, he had a suspected posterior circulation TIA given prior history of cerebellar CVA.  TEE in 9/10 showed no PFO, normal LV and RV systolic function, grade III plaque in the aortic arch/descending thoracic aorta. Patient has known nonobstructive CAD from cath in 2007. Last seen by Dr. Shirlee Latch in 9/12. He developed an elevated heart rate several nights ago that lasted for about one to one and a half hours. He denies chest pain. He denies dyspnea. He denies syncope. He denies orthopnea, PND or edema. His blood pressure was stable. He called the on-call PA who told him to take 150 mg of Toprol a day instead of 100 mg. He has noted a skipped heartbeat by palpating his radial pulse since that time. This typically occurs for a short period of time in the evenings. He denies any recurrent symptoms of his prior stroke.   Labs (8/10): creatinine 1.0, LDL 73  Labs (9/12): K 4.1, creatinine 0.83, LDL 53, HDL 42, TGs 131 Weights:  (9/12) 205 lbs => (9/13) 208 lbs   Past Medical History  Diagnosis Date  . Hyperlipidemia   . CVA (cerebral infarction)     cerebellar CVA 2000: doc by MRI; Poss TIA 7/10 with vertigo; MRI 7/10 => only small vessel disease; Carotids 9/10: no sig disease;  TEE 9/10: normal LV and RV size and systolic fxn, no PFO, no sig valvular dz, at least Gr  III plaque in arch and desc thoracic aorta (no ulceration or mobility)  . Hypertension   . Colon cancer     s/p R hemicolectomy 06/2008  . Psoriasis   . Diverticulosis   . Coronary artery disease     Non-Obstructive;  LHC 2000 no sig dz; ETT-Cardiolite 2007 with fixed Inf defect;  LHC 4/07:  EF 60%, CFX 30-40%, mRCA 30-40% (prob false + CLite)    Current Outpatient Prescriptions  Medication Sig Dispense Refill  . doxycycline (VIBRAMYCIN) 100 MG capsule Take 100 mg by mouth daily.      Marland Kitchen amLODipine (NORVASC) 5 MG tablet Take 5 mg by mouth daily.        Marland Kitchen aspirin 81 MG tablet Take 81 mg by mouth daily.        Marland Kitchen atorvastatin (LIPITOR) 10 MG tablet Take 10 mg by mouth daily.        . clopidogrel (PLAVIX) 75 MG tablet Take 75 mg by mouth daily.        . hydrochlorothiazide (HYDRODIURIL) 25 MG tablet Take 25 mg by mouth daily.        Marland Kitchen losartan (COZAAR) 50 MG tablet TAKE 1 TABLET BY MOUTH EVERY DAY  90 tablet  0  . metoprolol (TOPROL-XL) 100 MG 24 hr tablet Take 150 mg by mouth as directed. TAKE 50 MG IN THE AM AND TAKE 100  MG IN THE PM        Allergies: No Known Allergies  History  Substance Use Topics  . Smoking status: Former Smoker    Quit date: 10/14/1973  . Smokeless tobacco: Not on file  . Alcohol Use: Yes     ROS:  Please see the history of present illness.   He experienced dark stools several weeks ago. He was taking large amounts of Aleve. He stopped this and his stools returned to normal color. He had a colonoscopy recently for followup surveillance. He had several polyps removed that were benign. He denies vomiting, diarrhea, constipation, skin or hair changes.  All other systems reviewed and negative.   PHYSICAL EXAM: VS:  BP 128/76  Pulse 70  Ht 6\' 1"  (1.854 m)  Wt 208 lb 12.8 oz (94.711 kg)  BMI 27.55 kg/m2 Well nourished, well developed, in no acute distress HEENT: normal Neck: no JVD at 90 Cardiac:  normal S1, S2; RRR; no murmur Lungs:  clear to auscultation  bilaterally, no wheezing, rhonchi or rales Abd: soft, nontender Ext: no edema Skin: warm and dry Neuro:  CNs 2-12 intact, no focal abnormalities noted  EKG:  Sinus rhythm, heart rate 70, normal axis      ASSESSMENT AND PLAN:  1. Palpitations: He had symptoms of a rapid heart rhythm that lasted about an hour to 1.5 hours. He also has had a skipping sensation. With his prior history of stroke, I will set him up for an event monitor to rule out the possibility of atrial fibrillation. Continue current dose of Toprol. Check a basic metabolic panel, magnesium, CBC and TSH today. Previous ejection fraction noted to be normal.  Discussed with Dr. Shirlee Latch, who also saw the patient.  Will go ahead and repeat 2D Echo.  Followup with Dr. Shirlee Latch in 4-6 weeks.  2. Hypertension: Controlled. Continue current therapy.  3. Coronary Artery Disease: No angina. Continue aspirin, Plavix and statin.  4. Prior Stroke: No recurrent symptoms. Continue current antiplatelet therapy.   Signed, Tereso Newcomer, PA-C  9:19 AM 06/17/2012

## 2012-06-17 NOTE — Patient Instructions (Addendum)
Your physician has recommended that you wear an event monitor FOR 21 DAYS/ PER  SCOTT WEAVER, PA-C., FOR 785.1.  Event monitors are medical devices that record the heart's electrical activity. Doctors most often [place] these monitors to diagnose arrhythmias. Arrhythmias are problems with the speed or rhythm of the heartbeat. The monitor is a small, portable device. You can wear one while you do your normal daily activities. This is usually used to diagnose what is causing palpitations/syncope (passing out).  Your physician recommends that you continue on your current medications as directed. Please refer to the Current Medication list given to you today.   Your physician recommends that you HAVE lab work today: bmet,cbc,tsh  Your physician recommends that you schedule a follow-up appointment in:  4-6 weeks with Dr. Shirlee Latch  Your physician has requested that you have an echocardiogram. Echocardiography is a painless test that uses sound waves to create images of your heart. It provides your doctor with information about the size and shape of your heart and how well your heart's chambers and valves are working. This procedure takes approximately one hour. There are no restrictions for this procedure.DX: 785.1

## 2012-06-18 ENCOUNTER — Telehealth: Payer: Self-pay | Admitting: *Deleted

## 2012-06-18 NOTE — Telephone Encounter (Signed)
Message copied by Tarri Fuller on Thu Jun 18, 2012  3:36 PM ------      Message from: [place], [place] T      Created: Wed Jun 17, 2012  2:13 PM       Labs normal      [place], [place]  2:13 PM 06/17/2012

## 2012-06-18 NOTE — Telephone Encounter (Signed)
pt's wife notified about lab results w/verbal understanding  

## 2012-06-18 NOTE — Telephone Encounter (Signed)
s/w pt today about seeing pulmonology for eval for sleep study, pt said he cannot afford right now and will let us know when he can do sleep study eval. I told pt that I will notify Bing Neighbors. PAC, pt said thank you

## 2012-06-23 ENCOUNTER — Ambulatory Visit (HOSPITAL_COMMUNITY): Payer: Medicare Other | Attending: Cardiovascular Disease | Admitting: Radiology

## 2012-06-23 DIAGNOSIS — I2510 Atherosclerotic heart disease of native coronary artery without angina pectoris: Secondary | ICD-10-CM

## 2012-06-23 DIAGNOSIS — R002 Palpitations: Secondary | ICD-10-CM | POA: Insufficient documentation

## 2012-06-23 DIAGNOSIS — I059 Rheumatic mitral valve disease, unspecified: Secondary | ICD-10-CM | POA: Insufficient documentation

## 2012-06-23 DIAGNOSIS — I517 Cardiomegaly: Secondary | ICD-10-CM

## 2012-06-23 DIAGNOSIS — I379 Nonrheumatic pulmonary valve disorder, unspecified: Secondary | ICD-10-CM | POA: Insufficient documentation

## 2012-06-23 DIAGNOSIS — I10 Essential (primary) hypertension: Secondary | ICD-10-CM | POA: Insufficient documentation

## 2012-06-23 NOTE — Progress Notes (Signed)
Echocardiogram performed.  

## 2012-06-24 ENCOUNTER — Telehealth: Payer: Self-pay | Admitting: *Deleted

## 2012-06-24 ENCOUNTER — Encounter: Payer: Self-pay | Admitting: Physician Assistant

## 2012-06-24 NOTE — Telephone Encounter (Signed)
Message copied by Tarri Fuller on Wed Jun 24, 2012 10:46 AM ------      Message from: [place], [place] T      Created: Wed Jun 24, 2012  8:18 AM       EF normal      Tereso Newcomer, [place]  8:17 AM 06/24/2012

## 2012-06-24 NOTE — Telephone Encounter (Signed)
lmom echo normal 

## 2012-06-26 ENCOUNTER — Encounter (INDEPENDENT_AMBULATORY_CARE_PROVIDER_SITE_OTHER): Payer: Medicare Other

## 2012-06-26 DIAGNOSIS — I2510 Atherosclerotic heart disease of native coronary artery without angina pectoris: Secondary | ICD-10-CM

## 2012-06-26 DIAGNOSIS — I10 Essential (primary) hypertension: Secondary | ICD-10-CM

## 2012-06-26 DIAGNOSIS — R002 Palpitations: Secondary | ICD-10-CM

## 2012-07-17 ENCOUNTER — Ambulatory Visit (INDEPENDENT_AMBULATORY_CARE_PROVIDER_SITE_OTHER): Payer: Medicare Other | Admitting: Cardiology

## 2012-07-17 ENCOUNTER — Encounter: Payer: Self-pay | Admitting: Cardiology

## 2012-07-17 VITALS — BP 142/88 | HR 64 | Resp 14 | Ht 73.0 in | Wt 211.0 lb

## 2012-07-17 DIAGNOSIS — I6350 Cerebral infarction due to unspecified occlusion or stenosis of unspecified cerebral artery: Secondary | ICD-10-CM

## 2012-07-17 DIAGNOSIS — R002 Palpitations: Secondary | ICD-10-CM

## 2012-07-17 DIAGNOSIS — I2510 Atherosclerotic heart disease of native coronary artery without angina pectoris: Secondary | ICD-10-CM

## 2012-07-17 DIAGNOSIS — I10 Essential (primary) hypertension: Secondary | ICD-10-CM

## 2012-07-17 DIAGNOSIS — E785 Hyperlipidemia, unspecified: Secondary | ICD-10-CM

## 2012-07-17 NOTE — Patient Instructions (Addendum)
Your physician wants you to follow-up in:  12 months.  You will receive a reminder letter in the mail two months in advance. If you don't receive a letter, please call our office to schedule the follow-up appointment.   

## 2012-07-19 DIAGNOSIS — R002 Palpitations: Secondary | ICD-10-CM | POA: Insufficient documentation

## 2012-07-19 NOTE — Progress Notes (Signed)
Patient ID: Jose Meyer, male   DOB: Apr 15, 1946, 66 y.o.   MRN: [id number] PCP: Dr. Kevan Ny  66 yo with history of prior cerebellar CVA, nonobstructive CAD, HTN, hyperlipidemia presents for cardiology followup.  Patient has a history of cerebellar CVA in 2000, documented by MRI.  Symptoms at that time were difficulty with balace and discoordination.  No vertigo at that time.  In 7/10, pt had just gotten dressed for church one Sunday and was not doing anything in particular.  He developed a severe spinning sensation like vertigo that lasted for about 5 minutes.  He was "woozy" for about 1.5 hours after that before finally returning to normal.  He does not remember having the vertigo set off by moving his head in a particular direction.  He went to the ER and had an MRI of his head done, showing only small vessel disease.  There was some concern that this could have been a posterior circulation TIA given prior history of cerebellar CVA.  He did have a TEE in 9/10 showing no PFO, normal LV and RV systolic function, grade III plaque in the aortic arch/descending thoracic aorta.  Patient has known nonobstructive CAD from cath in 2007.   He was last seen about a month ago by [employer] because of an episode of tachycardia (HR > 100 for several hours).  This occurred after he had had an epidural steroid injection earlier that day and also after he had drunk caffeine for the first time in over a year.  He is still wearing a 30 day event monitor but has had no significant arrhythmic events yet.  He has had some PVCs.  Echo showed normal EF, no significant abnormalities.  He has been doing well recently.  Only occasional palpitations.  He is not drinking caffeine.  He has had chronic low back pain (necessitating the epidural injection).  Since the epidural, he is able to walk farther (up to 1.5 miles).  No chest pain, no exertional dyspnea.   Labs (8/10): creatinine 1.0, LDL 73 Labs (9/12): K 4.1, creatinine 0.83,  LDL 53, HDL 42, TGs 131 Labs (9/13): K 4.1, creatinine 0.9, TSH normal  Allergies (verified):  No Known Drug Allergies  Past Medical History: 1. Hyperlipidemia 2. Cerebellar CVA 2000: documented by MRI.  Possible TIA 7/10 with vertigo.  MRI 7/10 showed only small vessel disease.  Possible TIA 7/10.  Carotid dopplers (9/10) with no significant disease.  TEE (9/10): Normal LV and RV size and systolic function.  No PFO.  No significant valvular disease.  At least grade III plaque in the arch and descending thoracic aorta with no ulceration or mobility.  3. HTN 4. History of colon CA s/p R hemicolectomy in 9/09.  5. psoriasis 6. diverticulosis 7. CAD: nonobstructive.  LHC in 2000 without significant disease.  ETT-cardiolite 2007 with fixed inferior defect.  LHC (4/07) showed EF 60%, 30-40% CFX stenosis, 30-40% mRCA stenosis.  Probably false positive cardiolite.  Echo (9/13): EF 55-60%, mild LVH, grade I diastolic dysfunction, mild to moderate LAE. 8. Palpitations: PVCs.  30 day event monitor in 9/13 with occasional PVCs but no more significant arrhythmias.   9. Chronic low back pain.   Family History: No premature CAD.   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 Lives in [place].  No smoking since 1975.  Occasional ETOH.  Married with grown children [facility], cardiologist in [place]).   ROS: All systems reviewed and negative  except as per HPI.   Current Outpatient Prescriptions  Medication Sig Dispense Refill  . amLODipine (NORVASC) 5 MG tablet Take 5 mg by mouth daily.        Marland Kitchen aspirin 81 MG tablet Take 81 mg by mouth daily.        Marland Kitchen atorvastatin (LIPITOR) 10 MG tablet Take 10 mg by mouth daily.        . clopidogrel (PLAVIX) 75 MG tablet Take 75 mg by mouth daily.        Marland Kitchen doxycycline (VIBRAMYCIN) 100 MG capsule Take 100 mg by mouth daily.      . hydrochlorothiazide (HYDRODIURIL) 25 MG tablet Take 25 mg by mouth daily.         Marland Kitchen losartan (COZAAR) 50 MG tablet TAKE 1 TABLET BY MOUTH EVERY DAY  90 tablet  0  . metoprolol (TOPROL-XL) 100 MG 24 hr tablet Take 150 mg by mouth as directed. TAKE 50 MG IN THE AM AND TAKE 100 MG IN THE PM        BP 142/88  Pulse 64  Resp 14  Ht 6\' 1"  (1.854 m)  Wt 211 lb (95.709 kg)  BMI 27.84 kg/m2 General: NAD Neck: No JVD, no thyromegaly or thyroid nodule.  Lungs: Clear to auscultation bilaterally with normal respiratory effort. CV: Nondisplaced PMI.  Heart regular S1/S2, no S3/S4, no murmur.  No peripheral edema.  No carotid bruit.  Normal pedal pulses.  Abdomen: Soft, nontender, no hepatosplenomegaly, no distention.  Neurologic: Alert and oriented x 3.  Psych: Normal affect. Extremities: No clubbing or cyanosis.   Assessment/Plan:  Palpitations Patient has occasional PVCs, which are likely the cause of his periodic palpitations.  The episode of tachycardia may have been a combination of caffeine use and steroid injection.  Heart was structurally near-normal on echo 9/13.   CAD, NATIVE VESSEL  Nonobstructive CAD on last cath in 2007. No ischemic symptoms. He is on a good secondary prevention regimen. Continue ASA, Plavix, ARB, Toprol XL, and statin. I would suggest that he get more regular with exercise.  HYPERLIPIDEMIA-MIXED LDL is at goal (< 70). I will get a copy of most recent lipids from PCP.  HYPERTENSION, UNSPECIFIED   BP has been well-controlled. Continue current regimen.  CVA  H/o CVA/TIA in the past. He will continue on Plavix and ASA 81 mg daily. No recent neurological symptoms.   Sanjana Folz [employer]

## 2012-08-03 ENCOUNTER — Telehealth: Payer: Self-pay | Admitting: *Deleted

## 2012-08-03 NOTE — Telephone Encounter (Signed)
Dr Shirlee Latch reviewed  Monitor done 06/26/12-07/16/12. NSR with PVCs. Most recent event  was sinus tachycardia (felt palpitations). No atrial fibrillation.  Pt notified of results.

## 2012-11-28 ENCOUNTER — Other Ambulatory Visit: Payer: Self-pay

## 2013-01-07 ENCOUNTER — Other Ambulatory Visit: Payer: Self-pay | Admitting: Dermatology

## 2013-03-16 ENCOUNTER — Other Ambulatory Visit: Payer: Self-pay | Admitting: Family Medicine

## 2013-03-16 DIAGNOSIS — Z85038 Personal history of other malignant neoplasm of large intestine: Secondary | ICD-10-CM

## 2013-03-18 ENCOUNTER — Ambulatory Visit
Admission: RE | Admit: 2013-03-18 | Discharge: 2013-03-18 | Disposition: A | Discharge status: Home / Self Care | Payer: Medicare Other | Source: Ambulatory Visit | Attending: Family Medicine | Admitting: Family Medicine

## 2013-03-18 DIAGNOSIS — Z85038 Personal history of other malignant neoplasm of large intestine: Secondary | ICD-10-CM

## 2013-03-18 MED ORDER — IOHEXOL 300 MG/ML  SOLN
125.0000 mL | Freq: Once | INTRAMUSCULAR | Status: AC | PRN
  Administered 2013-03-18: 125 mL via INTRAVENOUS

## 2013-05-19 ENCOUNTER — Other Ambulatory Visit: Payer: Self-pay

## 2013-07-20 ENCOUNTER — Other Ambulatory Visit: Payer: Self-pay | Admitting: Dermatology

## 2013-08-19 ENCOUNTER — Other Ambulatory Visit: Payer: Self-pay

## 2013-09-20 ENCOUNTER — Encounter: Payer: Self-pay | Admitting: Cardiology

## 2013-09-23 ENCOUNTER — Encounter: Payer: Self-pay | Admitting: Cardiology

## 2013-09-23 ENCOUNTER — Ambulatory Visit (INDEPENDENT_AMBULATORY_CARE_PROVIDER_SITE_OTHER): Payer: Medicare Other | Admitting: Cardiology

## 2013-09-23 VITALS — BP 118/72 | HR 80 | Ht 73.0 in | Wt 227.0 lb

## 2013-09-23 DIAGNOSIS — I2510 Atherosclerotic heart disease of native coronary artery without angina pectoris: Secondary | ICD-10-CM

## 2013-09-23 DIAGNOSIS — I10 Essential (primary) hypertension: Secondary | ICD-10-CM

## 2013-09-23 DIAGNOSIS — I6350 Cerebral infarction due to unspecified occlusion or stenosis of unspecified cerebral artery: Secondary | ICD-10-CM

## 2013-09-23 DIAGNOSIS — E785 Hyperlipidemia, unspecified: Secondary | ICD-10-CM

## 2013-09-23 NOTE — Progress Notes (Signed)
Patient ID: Jose Meyer, male   DOB: May 07, 1946, 67 y.o.   MRN: [id number] PCP: Dr. Kevan Meyer  67 yo with history of prior cerebellar CVA, nonobstructive CAD, HTN, hyperlipidemia presents for cardiology followup.  Patient has a history of cerebellar CVA in 2000, documented by MRI.  Symptoms at that time were difficulty with balace and discoordination.  No vertigo at that time.  In 7/10, pt had just gotten dressed for church one Sunday and was not doing anything in particular.  He developed a severe spinning sensation like vertigo that lasted for about 5 minutes.  He was "woozy" for about 1.5 hours after that before finally returning to normal.  He does not remember having the vertigo set off by moving his head in a particular direction.  He went to the ER and had an MRI of his head done, showing only small vessel disease.  There was some concern that this could have been a posterior circulation TIA given prior history of cerebellar CVA.  He did have a TEE in 9/10 showing no PFO, normal LV and RV systolic function, grade III plaque in the aortic arch/descending thoracic aorta.  Patient has known nonobstructive CAD from cath in 2007.  Last echo in 9/13 showed normal EF.   Since last appointment, [facility] has been stable symptomatically.  Main problem continues to be low back pain.  He has had 2 more epidural steroid injections.  No exertional chest pain or dyspnea.  Exercise is somewhat limited by back pain.  He is no longer on a vegetarian diet and weight is back up about 16 lbs since prior appointment.  BP has been under good control (SBP runs in the 110s for the most part).  He has had mild ankle edema.    ECG: NSR, normal  Labs (8/10): creatinine 1.0, LDL 73 Labs (9/12): K 4.1, creatinine 0.83, LDL 53, HDL 42, TGs 131 Labs (9/13): K 4.1, creatinine 0.9, TSH normal  Allergies (verified):  No Known Drug Allergies  Past Medical History: 1. Hyperlipidemia 2. Cerebellar CVA 2000: documented by  MRI.  Possible TIA 7/10 with vertigo.  MRI 7/10 showed only small vessel disease.  Possible TIA 7/10.  Carotid dopplers (9/10) with no significant disease.  TEE (9/10): Normal LV and RV size and systolic function.  No PFO.  No significant valvular disease.  At least grade III plaque in the arch and descending thoracic aorta with no ulceration or mobility.  3. HTN 4. History of colon CA s/p R hemicolectomy in 9/09.  5. psoriasis 6. diverticulosis 7. CAD: nonobstructive.  LHC in 2000 without significant disease.  ETT-cardiolite 2007 with fixed inferior defect.  LHC (4/07) showed EF 60%, 30-40% CFX stenosis, 30-40% mRCA stenosis.  Probably false positive cardiolite.  Echo (9/13): EF 55-60%, mild LVH, grade I diastolic dysfunction, mild to moderate LAE. 8. Palpitations: PVCs.  30 day event monitor in 9/13 with occasional PVCs but no more significant arrhythmias.   9. Chronic low back pain.   Family History: No premature CAD.   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 Lives in [place].  No smoking since 1975.  Occasional ETOH.  Married with grown children [facility], cardiologist in [place]).  Enjoys woodworking.  ROS: All systems reviewed and negative except as per HPI.   Current Outpatient Prescriptions  Medication Sig Dispense Refill  . azithromycin (ZITHROMAX) 250 MG tablet as directed.      Marland Kitchen amLODipine (NORVASC) 2.5 MG tablet  Take 1 tablet (2.5 mg total) by mouth daily.      Marland Kitchen aspirin 81 MG tablet Take 81 mg by mouth daily.        Marland Kitchen atorvastatin (LIPITOR) 10 MG tablet Take 10 mg by mouth daily.        . clopidogrel (PLAVIX) 75 MG tablet Take 75 mg by mouth daily.        . hydrochlorothiazide (HYDRODIURIL) 25 MG tablet Take 25 mg by mouth daily.        Marland Kitchen losartan (COZAAR) 50 MG tablet TAKE 1 TABLET BY MOUTH EVERY DAY  90 tablet  0  . metoprolol (TOPROL-XL) 100 MG 24 hr tablet Take 150 mg by mouth as directed. TAKE 50 MG IN THE  AM AND TAKE 100 MG IN THE PM       No current facility-administered medications for this visit.    BP 118/72  Pulse 80  Ht 6\' 1"  (1.854 m)  Wt 102.967 kg (227 lb)  BMI 29.96 kg/m2 General: NAD Neck: No JVD, no thyromegaly or thyroid nodule.  Lungs: Clear to auscultation bilaterally with normal respiratory effort. CV: Nondisplaced PMI.  Heart regular S1/S2, no S3/S4, no murmur.  1+ ankle edema.  No carotid bruit.  Normal pedal pulses.  Abdomen: Soft, nontender, no hepatosplenomegaly, no distention.  Neurologic: Alert and oriented x 3.  Psych: Normal affect. Extremities: No clubbing or cyanosis.   Assessment/Plan:  Palpitations Patient has occasional PVCs, which are likely the cause of his periodic palpitations.   CAD  Nonobstructive CAD on last cath in 2007. No ischemic symptoms. He is on a good secondary prevention regimen. Continue ASA, Plavix, ARB, Toprol XL, and statin. I would suggest that he get more regular with exercise.  HYPERLIPIDEMIA-MIXED I will get a copy of most recent lipids from PCP.  Given nonobstructive CAD, would like to see LDL < 70.  HYPERTENSION, UNSPECIFIED   BP has been well-controlled. We may be able to cut back a bit on his regimen.  Given ankle edema will decrease amlodipine to 2.5 mg daily.  If BP remains < 140/90, can stop amlodipine altogether with ongoing BP monitoring.  CVA  H/o CVA/TIA in the past. He will continue on Plavix. No recent neurological symptoms.   Marca Ancona 09/23/2013

## 2013-09-23 NOTE — Patient Instructions (Signed)
Decrease amlodipine to 2.5mg  daily. If your blood pressure stays less than 140-90 after 2 weeks you can stop amlodipine. Continue to check your blood pressure and if stays less than 140/90 you can stay off amlodipine.  Your physician wants you to follow-up in: 1 year with Dr Shirlee Latch. (December 2015).You will receive a reminder letter in the mail two months in advance. If you don't receive a letter, please call our office to schedule the follow-up appointment.

## 2013-10-27 ENCOUNTER — Other Ambulatory Visit: Payer: Self-pay | Admitting: Dermatology

## 2014-02-16 ENCOUNTER — Encounter (HOSPITAL_COMMUNITY): Payer: Self-pay | Admitting: Emergency Medicine

## 2014-02-16 ENCOUNTER — Emergency Department (HOSPITAL_COMMUNITY)
Admission: EM | Admit: 2014-02-16 | Discharge: 2014-02-16 | Disposition: A | Discharge status: Home / Self Care | Payer: Medicare Other | Attending: Emergency Medicine | Admitting: Emergency Medicine

## 2014-02-16 DIAGNOSIS — Z85038 Personal history of other malignant neoplasm of large intestine: Secondary | ICD-10-CM | POA: Insufficient documentation

## 2014-02-16 DIAGNOSIS — E785 Hyperlipidemia, unspecified: Secondary | ICD-10-CM | POA: Insufficient documentation

## 2014-02-16 DIAGNOSIS — Z87891 Personal history of nicotine dependence: Secondary | ICD-10-CM | POA: Insufficient documentation

## 2014-02-16 DIAGNOSIS — Z8673 Personal history of transient ischemic attack (TIA), and cerebral infarction without residual deficits: Secondary | ICD-10-CM | POA: Insufficient documentation

## 2014-02-16 DIAGNOSIS — I2510 Atherosclerotic heart disease of native coronary artery without angina pectoris: Secondary | ICD-10-CM | POA: Insufficient documentation

## 2014-02-16 DIAGNOSIS — Z872 Personal history of diseases of the skin and subcutaneous tissue: Secondary | ICD-10-CM | POA: Insufficient documentation

## 2014-02-16 DIAGNOSIS — Z7982 Long term (current) use of aspirin: Secondary | ICD-10-CM | POA: Insufficient documentation

## 2014-02-16 DIAGNOSIS — Z7902 Long term (current) use of antithrombotics/antiplatelets: Secondary | ICD-10-CM | POA: Insufficient documentation

## 2014-02-16 DIAGNOSIS — R42 Dizziness and giddiness: Secondary | ICD-10-CM | POA: Insufficient documentation

## 2014-02-16 DIAGNOSIS — I10 Essential (primary) hypertension: Secondary | ICD-10-CM | POA: Insufficient documentation

## 2014-02-16 DIAGNOSIS — Z8719 Personal history of other diseases of the digestive system: Secondary | ICD-10-CM | POA: Insufficient documentation

## 2014-02-16 LAB — CBC
HCT: 45 % (ref 39.0–52.0)
HEMOGLOBIN: 15.8 g/dL (ref 13.0–17.0)
MCH: 33.1 pg (ref 26.0–34.0)
MCHC: 35.1 g/dL (ref 30.0–36.0)
MCV: 94.1 fL (ref 78.0–100.0)
Platelets: 201 10*3/uL (ref 150–400)
RBC: 4.78 MIL/uL (ref 4.22–5.81)
RDW: 13.4 % (ref 11.5–15.5)
WBC: 9.2 10*3/uL (ref 4.0–10.5)

## 2014-02-16 LAB — BASIC METABOLIC PANEL
BUN: 17 mg/dL (ref 6–23)
CO2: 26 mEq/L (ref 19–32)
Calcium: 8.5 mg/dL (ref 8.4–10.5)
Chloride: 103 mEq/L (ref 96–112)
Creatinine, Ser: 1.1 mg/dL (ref 0.50–1.35)
GFR calc Af Amer: 77 mL/min — ABNORMAL LOW (ref >90)
GFR calc non Af Amer: 67 mL/min — ABNORMAL LOW (ref >90)
GLUCOSE: 96 mg/dL (ref 70–99)
POTASSIUM: 4.1 meq/L (ref 3.7–5.3)
SODIUM: 141 meq/L (ref 137–147)

## 2014-02-16 LAB — TROPONIN I: Troponin I: <0.3 ng/mL (ref <0.30)

## 2014-02-16 LAB — CBG MONITORING, ED: Glucose-Capillary: 100 mg/dL — ABNORMAL HIGH (ref 70–99)

## 2014-02-16 MED ORDER — SODIUM CHLORIDE 0.9 % IV BOLUS (SEPSIS)
1000.0000 mL | Freq: Once | INTRAVENOUS | Status: AC
  Administered 2014-02-16: 1000 mL via INTRAVENOUS

## 2014-02-16 NOTE — Discharge Instructions (Signed)
Return to the ED with any concerns including chest pain, fainting, difficulty breathing, changes in vision or speech, weakness in arms or legs, decreased level of alertness/lethargy, or any other alarming symptoms

## 2014-02-16 NOTE — ED Provider Notes (Addendum)
CSN: [id number]     Arrival date & time 02/16/14  1455 History   First MD Initiated Contact with Patient 02/16/14 1505     Chief Complaint  Patient presents with  . Dizziness     (Consider location/radiation/quality/duration/timing/severity/associated sxs/prior Treatment) HPI Pt presents due to feeling lightheaded and hot.  Pt states he was walking with a friend and was wearing long pants and did not drink enough water.  He began to feel hot and lightheaded while walking.  Was worse after sitting down and then standing up.  No chest pain or difficulty breathing.  No leg swelling.  No fainting.  There are no other associated systemic symptoms, there are no other alleviating or modifying factors. Pt states he feels much improved after resting in the ED and getting cooled off.   Past Medical History  Diagnosis Date  . Hyperlipidemia   . CVA (cerebral infarction)     cerebellar CVA 2000: doc by MRI; Poss TIA 7/10 with vertigo; MRI 7/10 => only small vessel disease; Carotids 9/10: no sig disease;  TEE 9/10: normal LV and RV size and systolic fxn, no PFO, no sig valvular dz, at least Gr III plaque in arch and desc thoracic aorta (no ulceration or mobility)  . Hypertension   . Colon cancer     s/p R hemicolectomy 06/2008  . Psoriasis   . Diverticulosis   . Coronary artery disease     Non-Obstructive;  LHC 2000 no sig dz; ETT-Cardiolite 2007 with fixed Inf defect;  LHC 4/07:  EF 60%, CFX 30-40%, mRCA 30-40% (prob false + CLite)  . H/O echocardiogram     Echo 9/13: EF 55-60%, mod LVH, grade 1 diast dysfn, mild to mod LAE   History reviewed. No pertinent past surgical history. No family history on file. History  Substance Use Topics  . Smoking status: Former Smoker    Quit date: 10/14/1973  . Smokeless tobacco: Not on file  . Alcohol Use: Yes    Review of Systems ROS reviewed and all otherwise negative except for mentioned in HPI    Allergies  Review of patient's allergies indicates  no known allergies.  Home Medications   Prior to Admission medications   Medication Sig Start Date End Date Taking? Authorizing Provider  amLODipine (NORVASC) 5 MG tablet Take 5 mg by mouth every morning.   Yes Historical Provider, MD  aspirin 81 MG tablet Take 81 mg by mouth at bedtime.    Yes Historical Provider, MD  atorvastatin (LIPITOR) 10 MG tablet Take 10 mg by mouth at bedtime.    Yes Historical Provider, MD  clopidogrel (PLAVIX) 75 MG tablet Take 75 mg by mouth daily.    Yes Historical Provider, MD  hydrochlorothiazide (HYDRODIURIL) 25 MG tablet Take 25 mg by mouth every morning.    Yes Historical Provider, MD  losartan (COZAAR) 50 MG tablet Take 50 mg by mouth every morning.   Yes Historical Provider, MD  metoprolol (TOPROL-XL) 100 MG 24 hr tablet Take 50-100 mg by mouth as directed. TAKE 50 MG IN THE AM AND TAKE 100 MG IN THE PM   Yes Historical Provider, MD   BP 145/73  Pulse 83  Temp(Src) 98.1 F (36.7 C) (Oral)  Resp 16  SpO2 96% Vitals reviewed Physical Exam Physical Examination: General appearance - alert, well appearing, and in no distress Mental status - alert, oriented to person, place, and time Eyes - pupils equal and reactive, extraocular eye movements intact Mouth - mucous  membranes moist, pharynx normal without lesions Chest - clear to auscultation, no wheezes, rales or rhonchi, symmetric air entry Heart - normal rate, regular rhythm, normal S1, S2, no murmurs, rubs, clicks or gallops Abdomen - soft, nontender, nondistended, no masses or organomegaly Neurological - alert, oriented x 3, cranial nerves 2-12 tested and intact, strength 5/5 in extremities x 4, sensation intact Extremities - peripheral pulses normal, no pedal edema, no clubbing or cyanosis Skin - normal coloration and turgor, no rashes  ED Course  Procedures (including critical care time) Labs Review Labs Reviewed  BASIC METABOLIC PANEL - Abnormal; Notable for the following:    GFR calc non Af  Amer 67 (*)    GFR calc Af Amer 77 (*)    All other components within normal limits  CBG MONITORING, ED - Abnormal; Notable for the following:    Glucose-Capillary 100 (*)    All other components within normal limits  CBC  TROPONIN I    Imaging Review No results found.   EKG Interpretation   Date/Time:  Wednesday Feb 16 2014 15:10:35 EDT Ventricular Rate:  87 PR Interval:  155 QRS Duration: 82 QT Interval:  398 QTC Calculation: 479 R Axis:   -18 Text Interpretation:  Sinus rhythm Borderline left axis deviation Abnormal  R-wave progression, early transition Borderline prolonged QT interval  Baseline wander in lead(s) V2 No significant change since last tracing  Confirmed by [facility]  MD, Narely Nobles [phone]) on 02/16/2014 7:37:54 PM      MDM   Final diagnoses:  Lightheadedness    Pt presenting with c/o lightheadedness while walking in the heat today.  Pt has a normal exam, states that he feels much improved and has no current complaints during my ED evaluation.  Labs are reassuring, vitals reassuring as well- not orthostatic.  EKG reassuring.  Discharged with strict return precautions.  Pt agreeable with plan.    Threasa Beards, MD 02/16/14 [place], MD 02/16/14 [phone]

## 2014-02-16 NOTE — ED Notes (Signed)
PER EMS - pt was hiking and felt dizzy, unable to ambulate initially.  Pt reports feeling better at this time.  A+ox4.  Skin flushed/hot initially,  PWD.  526ml NS given by EMS.

## 2014-02-16 NOTE — ED Notes (Signed)
Initial contact - pt resting on stretcher, pt reports hiking today and started to feel dizzy.  Pt reports sitting down and resting but continued to feel dizzy when attempting to stand again.  Pt reports  "it's very hot and i didn't have water".  Pt denies CP/SOB, syncope or weakness during dizziness.  Pt denies all complaints at this time.  A+Ox4.  Skin PWD.  MAEI, self repositioning for comfort.  Speaking full/clear sentences.  NAD.

## 2014-02-16 NOTE — ED Notes (Signed)
Bed: WA19 Expected date:  Expected time:  Means of arrival:  Comments: ems 

## 2014-03-21 ENCOUNTER — Encounter: Payer: Self-pay | Admitting: Cardiology

## 2014-05-24 ENCOUNTER — Other Ambulatory Visit: Payer: Self-pay | Admitting: Dermatology

## 2014-06-21 ENCOUNTER — Other Ambulatory Visit: Payer: Self-pay | Admitting: Dermatology

## 2014-07-14 HISTORY — PX: KNEE SURGERY: SHX244

## 2014-10-26 ENCOUNTER — Ambulatory Visit (INDEPENDENT_AMBULATORY_CARE_PROVIDER_SITE_OTHER): Payer: Medicare Other | Admitting: Cardiology

## 2014-10-26 ENCOUNTER — Encounter: Payer: Self-pay | Admitting: Cardiology

## 2014-10-26 VITALS — BP 120/82 | HR 79 | Ht 72.0 in | Wt 226.8 lb

## 2014-10-26 DIAGNOSIS — G459 Transient cerebral ischemic attack, unspecified: Secondary | ICD-10-CM

## 2014-10-26 DIAGNOSIS — I10 Essential (primary) hypertension: Secondary | ICD-10-CM

## 2014-10-26 DIAGNOSIS — I2510 Atherosclerotic heart disease of native coronary artery without angina pectoris: Secondary | ICD-10-CM

## 2014-10-26 NOTE — Patient Instructions (Signed)
Your physician wants you to follow-up in: 1 year with Dr McLean. (January 2017). You will receive a reminder letter in the mail two months in advance. If you don't receive a letter, please call our office to schedule the follow-up appointment.  

## 2014-10-26 NOTE — Progress Notes (Signed)
Patient ID: Jose Meyer, male   DOB: 08/27/1946, 69 y.o.   MRN: [id number] PCP: Dr. Darcus Austin  69 yo with history of prior cerebellar CVA, nonobstructive CAD, HTN, hyperlipidemia presents for cardiology followup.  Patient has a history of cerebellar CVA in 2000, documented by MRI.  Symptoms at that time were difficulty with balace and discoordination.  No vertigo at that time.  In 7/10, pt had just gotten dressed for church one Sunday and was not doing anything in particular.  He developed a severe spinning sensation like vertigo that lasted for about 5 minutes.  He was "woozy" for about 1.5 hours after that before finally returning to normal.  He does not remember having the vertigo set off by moving his head in a particular direction.  He went to the ER and had an MRI of his head done, showing only small vessel disease.  There was some concern that this could have been a posterior circulation TIA given prior history of cerebellar CVA.  He did have a TEE in 9/10 showing no PFO, normal LV and RV systolic function, grade III plaque in the aortic arch/descending thoracic aorta.  Patient has known nonobstructive CAD from cath in 2007.  Last echo in 9/13 showed normal EF.   Since last appointment, Jose Meyer has been stable symptomatically.  Main problem now is knee pain.  This has been limiting him from walking for exercise as much as he has wanted.  Weight is stable.  No exertional chest pain or dyspnea.   BP has been under good control.  At last appointment, I had let him cut back his amlodipine to 2.5 mg daily but BP rose so he increased it back to 5 mg daily and BP is controlled.  He has started swimming for exercise.    Labs (8/10): creatinine 1.0, LDL 73 Labs (9/12): K 4.1, creatinine 0.83, LDL 53, HDL 42, TGs 131 Labs (9/13): K 4.1, creatinine 0.9, TSH normal Labs (12/15): LDL 62, HDL 51, LFTs normal, K 4, creatinine 0.98  Allergies (verified):  No Known Drug Allergies  Past Medical  History: 1. Hyperlipidemia 2. Cerebellar CVA 2000: documented by MRI.  Possible TIA 7/10 with vertigo.  MRI 7/10 showed only small vessel disease.  Possible TIA 7/10.  Carotid dopplers (9/10) with no significant disease.  TEE (9/10): Normal LV and RV size and systolic function.  No PFO.  No significant valvular disease.  At least grade III plaque in the arch and descending thoracic aorta with no ulceration or mobility.  3. HTN 4. History of colon CA s/p R hemicolectomy in 9/09.  5. psoriasis 6. diverticulosis 7. CAD: nonobstructive.  LHC in 2000 without significant disease.  ETT-cardiolite 2007 with fixed inferior defect.  LHC (4/07) showed EF 60%, 30-40% CFX stenosis, 30-40% mRCA stenosis.  Probably false positive cardiolite.  Echo (9/13): EF 55-60%, mild LVH, grade I diastolic dysfunction, mild to moderate LAE. 8. Palpitations: PVCs.  30 day event monitor in 9/13 with occasional PVCs but no more significant arrhythmias.   9. Chronic low back pain.   Family History: No premature CAD.   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 Lives in [place].  No smoking since 1975.  Occasional ETOH.  Married with grown children Jose Meyer, cardiologist in [place]).  Enjoys woodworking.  ROS: All systems reviewed and negative except as per HPI.   Current Outpatient Prescriptions  Medication Sig Dispense Refill  . amLODipine (NORVASC) 5 MG  tablet Take 5 mg by mouth every morning.    Marland Kitchen aspirin 81 MG tablet Take 81 mg by mouth at bedtime.     Marland Kitchen atorvastatin (LIPITOR) 10 MG tablet Take 10 mg by mouth at bedtime.     . betamethasone dipropionate 0.05 % lotion Apply 6.83 application topically as needed. For the scalp after shower  0  . clopidogrel (PLAVIX) 75 MG tablet Take 75 mg by mouth daily.     Marland Kitchen ELIDEL 1 % cream Apply 1 application topically as needed. For skin  0  . furosemide (LASIX) 20 MG tablet Take 20 mg by mouth as needed. Fluid build  up  0  . hydrochlorothiazide (HYDRODIURIL) 25 MG tablet Take 25 mg by mouth every morning.     Marland Kitchen losartan (COZAAR) 50 MG tablet Take 50 mg by mouth every morning.    . metoprolol (TOPROL-XL) 100 MG 24 hr tablet Take 50-100 mg by mouth as directed. TAKE 50 MG IN THE AM AND TAKE 100 MG IN THE PM     No current facility-administered medications for this visit.    BP 120/82 mmHg  Pulse 79  Ht 6' (1.829 m)  Wt 226 lb 12.8 oz (102.876 kg)  BMI 30.75 kg/m2 General: NAD Neck: No JVD, no thyromegaly or thyroid nodule.  Lungs: Clear to auscultation bilaterally with normal respiratory effort. CV: Nondisplaced PMI.  Heart regular S1/S2, no S3/S4, no murmur.  No edema.  No carotid bruit.  Normal pedal pulses.  Abdomen: Soft, nontender, no hepatosplenomegaly, no distention.  Neurologic: Alert and oriented x 3.  Psych: Normal affect. Extremities: No clubbing or cyanosis.   Assessment/Plan:  Palpitations Patient has occasional PVCs, which are likely the cause of his periodic palpitations.   CAD  Nonobstructive CAD on last cath in 2007. No ischemic symptoms. He is on a good secondary prevention regimen. Continue ASA, Plavix, ARB, Toprol XL, and statin. He has started swimming for exercise. HYPERLIPIDEMIA-MIXED Good lipids when recently checked by PCP.  HYPERTENSION   BP has been well-controlled, continue current meds.  CVA  H/o CVA/TIA in the past. He will continue on Plavix. No recent neurological symptoms.   Followup in 1 year.   Jose Meyer 10/26/2014

## 2014-12-14 ENCOUNTER — Other Ambulatory Visit: Payer: Self-pay | Admitting: Dermatology

## 2014-12-29 ENCOUNTER — Other Ambulatory Visit: Payer: Self-pay | Admitting: Dermatology

## 2015-04-10 ENCOUNTER — Other Ambulatory Visit: Payer: Self-pay

## 2015-06-05 ENCOUNTER — Other Ambulatory Visit: Payer: Self-pay | Admitting: Gastroenterology

## 2015-11-30 ENCOUNTER — Encounter: Payer: Self-pay | Admitting: Cardiology

## 2015-11-30 ENCOUNTER — Ambulatory Visit (INDEPENDENT_AMBULATORY_CARE_PROVIDER_SITE_OTHER): Payer: Medicare Other | Admitting: Cardiology

## 2015-11-30 VITALS — BP 120/70 | HR 93 | Ht 72.0 in | Wt 231.1 lb

## 2015-11-30 DIAGNOSIS — I10 Essential (primary) hypertension: Secondary | ICD-10-CM | POA: Diagnosis not present

## 2015-11-30 DIAGNOSIS — I2510 Atherosclerotic heart disease of native coronary artery without angina pectoris: Secondary | ICD-10-CM | POA: Diagnosis not present

## 2015-11-30 DIAGNOSIS — I638 Other cerebral infarction: Secondary | ICD-10-CM | POA: Diagnosis not present

## 2015-11-30 DIAGNOSIS — I6389 Other cerebral infarction: Secondary | ICD-10-CM

## 2015-11-30 NOTE — Patient Instructions (Addendum)
Medication Instructions:  Your physician recommends that you continue on your current medications as directed. Please refer to the Current Medication list given to you today.  Labwork: We will call Jose Meyer, your primary doctor, to obtain lab results from her office.    Testing/Procedures: None ordered  Follow-Up: Your physician wants you to follow-up in: 1 year with Dr. Aundra Dubin.  You will receive a reminder letter in the mail two months in advance. If you don't receive a letter, please call our office to schedule the follow-up appointment.   Any Other Special Instructions Will Be Listed Below (If Applicable).  If you need a refill on your cardiac medications before your next appointment, please call your pharmacy.  Thank you for choosing [facility]!!

## 2015-12-01 NOTE — Progress Notes (Signed)
Patient ID: EURAL HOVORKA, male   DOB: May 28, 1946, 70 y.o.   MRN: [id number] PCP: Dr. Darcus Austin  70 yo with history of prior cerebellar CVA, nonobstructive CAD, HTN, hyperlipidemia presents for cardiology followup.  Patient has a history of cerebellar CVA in 2000, documented by MRI.  Symptoms at that time were difficulty with balace and discoordination.  No vertigo at that time.  In 7/10, pt had just gotten dressed for church one Sunday and was not doing anything in particular.  He developed a severe spinning sensation like vertigo that lasted for about 5 minutes.  He was "woozy" for about 1.5 hours after that before finally returning to normal.  He does not remember having the vertigo set off by moving his head in a particular direction.  He went to the ER and had an MRI of his head done, showing only small vessel disease.  There was some concern that this could have been a posterior circulation TIA given prior history of cerebellar CVA.  He did have a TEE in 9/10 showing no PFO, normal LV and RV systolic function, grade III plaque in the aortic arch/descending thoracic aorta.  Patient has known nonobstructive CAD from cath in 2007.  Last echo in 9/13 showed normal EF.   Since last appointment, Mr [facility] has been stable symptomatically.  No exertional chest pain or dyspnea.   BP has been under good control.  Weight is up 5 lbs since last appointment.  He has been going to the [facility] a couple of times a week.  He works out for 30 minutes on the machines.  He walks for a mile and jogs for 1/2 mile.  Labs (8/10): creatinine 1.0, LDL 73 Labs (9/12): K 4.1, creatinine 0.83, LDL 53, HDL 42, TGs 131 Labs (9/13): K 4.1, creatinine 0.9, TSH normal Labs (12/15): LDL 62, HDL 51, LFTs normal, K 4, creatinine 0.98  ECG: NSR, LVH, poor RWP  Allergies (verified):  No Known Drug Allergies  Past Medical History: 1. Hyperlipidemia 2. Cerebellar CVA 2000: documented by MRI.  Possible TIA 7/10 with vertigo.   MRI 7/10 showed only small vessel disease.  Possible TIA 7/10.  Carotid dopplers (9/10) with no significant disease.  TEE (9/10): Normal LV and RV size and systolic function.  No PFO.  No significant valvular disease.  At least grade III plaque in the arch and descending thoracic aorta with no ulceration or mobility.  3. HTN 4. History of colon CA s/p R hemicolectomy in 9/09.  5. psoriasis 6. diverticulosis 7. CAD: nonobstructive.  LHC in 2000 without significant disease.  ETT-cardiolite 2007 with fixed inferior defect.  LHC (4/07) showed EF 60%, 30-40% CFX stenosis, 30-40% mRCA stenosis.  Probably false positive cardiolite.  Echo (9/13): EF 55-60%, mild LVH, grade I diastolic dysfunction, mild to moderate LAE. 8. Palpitations: PVCs.  30 day event monitor in 9/13 with occasional PVCs but no more significant arrhythmias.   9. Chronic low back pain.   Family History: No premature CAD.   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  No smoking since 1975.  Occasional ETOH.  Married with 2 children [facility], cardiologist in [place]).  Enjoys woodworking.  ROS: All systems reviewed and negative except as per HPI.   Current Outpatient Prescriptions  Medication Sig Dispense Refill  . amLODipine (NORVASC) 5 MG tablet Take 5 mg by mouth every morning.    Marland Kitchen aspirin 81 MG tablet Take 81  mg by mouth at bedtime.     Marland Kitchen atorvastatin (LIPITOR) 10 MG tablet Take 10 mg by mouth at bedtime.     . betamethasone dipropionate 0.05 % lotion Apply [id number] application topically as needed. For the scalp after shower  0  . clopidogrel (PLAVIX) 75 MG tablet Take 75 mg by mouth daily.     Marland Kitchen ELIDEL 1 % cream Apply 1 application topically as needed. For skin  0  . furosemide (LASIX) 20 MG tablet Take 20 mg by mouth as needed. Fluid build up  0  . hydrochlorothiazide (HYDRODIURIL) 25 MG tablet Take 25 mg by mouth every morning.     Marland Kitchen losartan  (COZAAR) 50 MG tablet Take 50 mg by mouth every morning.    . metoprolol (TOPROL-XL) 100 MG 24 hr tablet Take 50-100 mg by mouth as directed. TAKE 50 MG IN THE AM AND TAKE 100 MG IN THE PM     No current facility-administered medications for this visit.    BP 120/70 mmHg  Pulse 93  Ht 6' (1.829 m)  Wt 231 lb 1.9 oz (104.835 kg)  BMI 31.34 kg/m2 General: NAD Neck: No JVD, no thyromegaly or thyroid nodule.  Lungs: Clear to auscultation bilaterally with normal respiratory effort. CV: Nondisplaced PMI.  Heart regular S1/S2, no S3/S4, no murmur.  No edema.  No carotid bruit.  Normal pedal pulses.  Abdomen: Soft, nontender, no hepatosplenomegaly, no distention.  Neurologic: Alert and oriented x 3.  Psych: Normal affect. Extremities: No clubbing or cyanosis.   Assessment/Plan:  Palpitations Patient has occasional PVCs, which are likely the cause of his periodic palpitations.   CAD  Nonobstructive CAD on last cath in 2007. No ischemic symptoms. He is on a good secondary prevention regimen. Continue ASA, Plavix, ARB, Toprol XL, and statin. Good exercise tolerance.  HYPERLIPIDEMIA Recent lipids done by PCP, we will call today to get a copy of the labs.  HYPERTENSION   BP has been well-controlled, continue current meds.  CVA  H/o CVA/TIA in the past. He will continue on Plavix. No recent neurological symptoms.   Needs to work on weight loss.  Followup in 1 year.   Loralie Champagne 12/01/2015

## 2016-10-02 ENCOUNTER — Ambulatory Visit
Admission: RE | Admit: 2016-10-02 | Discharge: 2016-10-02 | Disposition: A | Discharge status: Home / Self Care | Payer: Medicare Other | Source: Ambulatory Visit | Attending: Family Medicine | Admitting: Family Medicine

## 2016-10-02 ENCOUNTER — Encounter: Payer: Self-pay | Admitting: Cardiology

## 2016-10-02 ENCOUNTER — Other Ambulatory Visit: Payer: Self-pay | Admitting: Family Medicine

## 2016-10-02 DIAGNOSIS — R05 Cough: Secondary | ICD-10-CM

## 2016-10-02 DIAGNOSIS — R059 Cough, unspecified: Secondary | ICD-10-CM

## 2016-12-02 ENCOUNTER — Ambulatory Visit (HOSPITAL_COMMUNITY)
Admission: RE | Admit: 2016-12-02 | Discharge: 2016-12-02 | Disposition: A | Discharge status: Home / Self Care | Payer: Medicare Other | Source: Ambulatory Visit | Attending: Cardiology | Admitting: Cardiology

## 2016-12-02 VITALS — BP 134/78 | HR 83 | Ht 72.0 in | Wt 227.0 lb

## 2016-12-02 DIAGNOSIS — I509 Heart failure, unspecified: Secondary | ICD-10-CM | POA: Insufficient documentation

## 2016-12-02 DIAGNOSIS — R002 Palpitations: Secondary | ICD-10-CM | POA: Diagnosis not present

## 2016-12-02 DIAGNOSIS — Z85038 Personal history of other malignant neoplasm of large intestine: Secondary | ICD-10-CM | POA: Insufficient documentation

## 2016-12-02 DIAGNOSIS — Z7982 Long term (current) use of aspirin: Secondary | ICD-10-CM | POA: Insufficient documentation

## 2016-12-02 DIAGNOSIS — G8929 Other chronic pain: Secondary | ICD-10-CM | POA: Insufficient documentation

## 2016-12-02 DIAGNOSIS — R079 Chest pain, unspecified: Secondary | ICD-10-CM

## 2016-12-02 DIAGNOSIS — I2510 Atherosclerotic heart disease of native coronary artery without angina pectoris: Secondary | ICD-10-CM | POA: Diagnosis not present

## 2016-12-02 DIAGNOSIS — Z79899 Other long term (current) drug therapy: Secondary | ICD-10-CM | POA: Insufficient documentation

## 2016-12-02 DIAGNOSIS — Z7902 Long term (current) use of antithrombotics/antiplatelets: Secondary | ICD-10-CM | POA: Diagnosis not present

## 2016-12-02 DIAGNOSIS — I10 Essential (primary) hypertension: Secondary | ICD-10-CM | POA: Diagnosis not present

## 2016-12-02 DIAGNOSIS — I493 Ventricular premature depolarization: Secondary | ICD-10-CM | POA: Insufficient documentation

## 2016-12-02 DIAGNOSIS — Z8673 Personal history of transient ischemic attack (TIA), and cerebral infarction without residual deficits: Secondary | ICD-10-CM | POA: Diagnosis not present

## 2016-12-02 DIAGNOSIS — E785 Hyperlipidemia, unspecified: Secondary | ICD-10-CM | POA: Insufficient documentation

## 2016-12-02 DIAGNOSIS — I208 Other forms of angina pectoris: Secondary | ICD-10-CM

## 2016-12-02 NOTE — Patient Instructions (Signed)
Your physician has requested that you have en exercise stress myoview. For further information please visit [email or web address]. Please follow instruction sheet, as given.  We will call you with results.  We will contact you in 1 year to schedule your next appointment.

## 2016-12-03 ENCOUNTER — Telehealth (HOSPITAL_COMMUNITY): Payer: Self-pay | Admitting: Cardiology

## 2016-12-03 NOTE — Progress Notes (Signed)
Patient ID: KAUA EARNST, male   DOB: 11/19/1945, 71 y.o.   MRN: [id number] PCP: Dr. Darcus Austin Cardiology: Dr. Aundra Dubin  71 yo with history of prior cerebellar CVA, nonobstructive CAD, HTN, hyperlipidemia presents for cardiology followup.  Patient has a history of cerebellar CVA in 2000, documented by MRI.  Symptoms at that time were difficulty with balace and discoordination.  No vertigo at that time.  In 7/10, pt had just gotten dressed for church one Sunday and was not doing anything in particular.  He developed a severe spinning sensation like vertigo that lasted for about 5 minutes.  He was "woozy" for about 1.5 hours after that before finally returning to normal.  He does not remember having the vertigo set off by moving his head in a particular direction.  He went to the ER and had an MRI of his head done, showing only small vessel disease.  There was some concern that this could have been a posterior circulation TIA given prior history of cerebellar CVA.  He did have a TEE in 9/10 showing no PFO, normal LV and RV systolic function, grade III plaque in the aortic arch/descending thoracic aorta.  Patient has known nonobstructive CAD from cath in 2007.  Last echo in 9/13 showed normal EF.   Mr Bukowski tries to exercise regularly.  He has been limited, however, by plantar fasciitis this year. He still tries to walk 1-2 miles several times a week.  Just after Thanksgiving, he noted mild central chest heaviness when walking up a hill in the cold air.  This resolved with rest.  Just after this, he was diagnosed with PNA and had a course of antibiotics.  Since then, however, he continues to have occasional episodes of chest heaviness when walking up steps fast.  This will occur less than once a week and not consistently.  He feels like he fatigues more easily than in the past.  SBP < 130 when he checks at home.  Weight is down 4 lbs.  No stroke-like symptoms.   Labs (8/10): creatinine 1.0, LDL 73 Labs  (9/12): K 4.1, creatinine 0.83, LDL 53, HDL 42, TGs 131 Labs (9/13): K 4.1, creatinine 0.9, TSH normal Labs (12/15): LDL 62, HDL 51, LFTs normal, K 4, creatinine 0.98 Labs (12/17): K 3.6, creatinine 0.96, hgb 17.1, LDL 53, HDL 67  ECG: NSR, normal  Allergies (verified):  No Known Drug Allergies  Past Medical History: 1. Hyperlipidemia 2. Cerebellar CVA 2000: documented by MRI.  Possible TIA 7/10 with vertigo.  MRI 7/10 showed only small vessel disease.  Possible TIA 7/10.  Carotid dopplers (9/10) with no significant disease.  TEE (9/10): Normal LV and RV size and systolic function.  No PFO.  No significant valvular disease.  At least grade III plaque in the arch and descending thoracic aorta with no ulceration or mobility.  3. HTN 4. History of colon CA s/p R hemicolectomy in 9/09.  5. psoriasis 6. diverticulosis 7. CAD: nonobstructive.  LHC in 2000 without significant disease.  ETT-cardiolite 2007 with fixed inferior defect.  LHC (4/07) showed EF 60%, 30-40% CFX stenosis, 30-40% mRCA stenosis.  Probably false positive cardiolite.  Echo (9/13): EF 55-60%, mild LVH, grade I diastolic dysfunction, mild to moderate LAE. 8. Palpitations: PVCs.  30 day event monitor in 9/13 with occasional PVCs but no more significant arrhythmias.   9. Chronic low back pain.  10. Plantar fasciitis  Family History: No premature CAD.   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  No smoking since 1975.  Occasional ETOH.  Married with 2 children [facility], cardiologist in [place]).  Enjoys woodworking.  ROS: All systems reviewed and negative except as per HPI.   Current Outpatient Prescriptions  Medication Sig Dispense Refill  . amLODipine (NORVASC) 5 MG tablet Take 5 mg by mouth every morning.    Marland Kitchen aspirin 81 MG tablet Take 81 mg by mouth at bedtime.     Marland Kitchen atorvastatin (LIPITOR) 10 MG tablet Take 10 mg by mouth at bedtime.      . betamethasone dipropionate 0.05 % lotion Apply [id number] application topically as needed. For the scalp after shower  0  . clopidogrel (PLAVIX) 75 MG tablet Take 75 mg by mouth daily.     Marland Kitchen ELIDEL 1 % cream Apply 1 application topically as needed. For skin  0  . hydrochlorothiazide (HYDRODIURIL) 25 MG tablet Take 25 mg by mouth every morning.     Marland Kitchen losartan (COZAAR) 50 MG tablet Take 50 mg by mouth every morning.    . metoprolol (TOPROL-XL) 100 MG 24 hr tablet Take 50-100 mg by mouth as directed. TAKE 50 MG IN THE AM AND TAKE 100 MG IN THE PM     No current facility-administered medications for this encounter.     BP 134/78 (BP Location: Left Arm, Patient Position: Sitting, Cuff Size: Normal)   Pulse 83   Ht 6' (1.829 m)   Wt 227 lb (103 kg)   SpO2 99%   BMI 30.79 kg/m  General: NAD Neck: No JVD, no thyromegaly or thyroid nodule.  Lungs: Clear to auscultation bilaterally with normal respiratory effort. CV: Nondisplaced PMI.  Heart regular S1/S2, no S3/S4, no murmur.  No edema.  No carotid bruit.  Normal pedal pulses.  Abdomen: Soft, nontender, no hepatosplenomegaly, no distention.  Neurologic: Alert and oriented x 3.  Psych: Normal affect. Extremities: No clubbing or cyanosis.   Assessment/Plan:  Palpitations Patient has occasional PVCs, which are likely the cause of his periodic palpitations.   CAD  Nonobstructive CAD on last cath in 2007. He has had a few episodes of mild chest discomfort resolving with rest that could be angina. - Continue ASA, Plavix, ARB, Toprol XL, and statin.  - I will arrange for ETT-Cardiolite for risk stratification.  He will need to hold Toprol XL.   HYPERLIPIDEMIA Good lipids in 12/17.   HYPERTENSION   BP has been well-controlled, continue current meds.  CVA  H/o CVA/TIA in the past. He will continue on Plavix. No recent neurological symptoms.   Followup 1 year if ETT-Cardiolite is negative.    Loralie Champagne 12/03/2016

## 2016-12-04 ENCOUNTER — Ambulatory Visit (INDEPENDENT_AMBULATORY_CARE_PROVIDER_SITE_OTHER): Payer: Medicare Other | Admitting: Podiatry

## 2016-12-04 DIAGNOSIS — B351 Tinea unguium: Secondary | ICD-10-CM

## 2016-12-04 DIAGNOSIS — L608 Other nail disorders: Secondary | ICD-10-CM

## 2016-12-04 DIAGNOSIS — L603 Nail dystrophy: Secondary | ICD-10-CM | POA: Diagnosis not present

## 2016-12-04 DIAGNOSIS — M79609 Pain in unspecified limb: Secondary | ICD-10-CM | POA: Diagnosis not present

## 2016-12-04 NOTE — Progress Notes (Signed)
   SUBJECTIVE Patient  presents to office today complaining of elongated, thickened nails. Pain while ambulating in shoes. Patient is unable to trim their own nails.   OBJECTIVE General Patient is awake, alert, and oriented x 3 and in no acute distress. Derm Skin is dry and supple bilateral. Negative open lesions or macerations. Remaining integument unremarkable. Nails are tender, long, thickened and dystrophic with subungual debris, consistent with onychomycosis, 1-5 bilateral. No signs of infection noted. Vasc  DP and PT pedal pulses palpable bilaterally. Temperature gradient within normal limits.  Neuro Epicritic and protective threshold sensation diminished bilaterally.  Musculoskeletal Exam No symptomatic pedal deformities noted bilateral. Muscular strength within normal limits.  ASSESSMENT 1. Onychodystrophic nails 1-5 bilateral with hyperkeratosis of nails.  2. Onychomycosis of nail due to dermatophyte bilateral 3. Pain in foot bilateral  PLAN OF CARE 1. Patient evaluated today.  2. Instructed to maintain good pedal hygiene and foot care.  3. Mechanical debridement of nails 1-5 bilaterally performed using a nail nipper. Filed with dremel without incident.  4. Return to clinic in 3 mos.    Brent M. Evans, DPM [facility]  Dr. Brent M. Evans, DPM    [address]                                        [place], [place] [address]                Office [phone]  Fax [phone]      

## 2016-12-09 ENCOUNTER — Telehealth (HOSPITAL_COMMUNITY): Payer: Self-pay | Admitting: *Deleted

## 2016-12-09 NOTE — Telephone Encounter (Signed)
Patient given detailed instructions per Myocardial Perfusion Study Information Sheet for the test on 12/11/16. Patient notified to arrive 15 minutes early and that it is imperative to arrive on time for appointment to keep from having the test rescheduled.  If you need to cancel or reschedule your appointment, please call the office within 24 hours of your appointment. Failure to do so may result in a cancellation of your appointment, and a $50 no show fee. Patient verbalized understanding.Kirstie Peri

## 2016-12-11 ENCOUNTER — Ambulatory Visit (HOSPITAL_COMMUNITY): Payer: Medicare Other | Attending: Cardiovascular Disease

## 2016-12-11 DIAGNOSIS — R9439 Abnormal result of other cardiovascular function study: Secondary | ICD-10-CM | POA: Diagnosis not present

## 2016-12-11 DIAGNOSIS — R079 Chest pain, unspecified: Secondary | ICD-10-CM | POA: Diagnosis not present

## 2016-12-11 LAB — MYOCARDIAL PERFUSION IMAGING
CHL CUP NUCLEAR SDS: 0
CSEPED: 6 min
Estimated workload: 7 METS
Exercise duration (sec): 0 s
LV dias vol: 72 mL (ref 62–150)
LV sys vol: 26 mL
MPHR: 148 {beats}/min
Peak HR: 157 {beats}/min
Percent HR: 106 %
RATE: 0.31
Rest HR: 95 {beats}/min
SRS: 6
SSS: 6
TID: 0.75

## 2016-12-11 MED ORDER — TECHNETIUM TC 99M TETROFOSMIN IV KIT
32.7000 | PACK | Freq: Once | INTRAVENOUS | Status: AC | PRN
  Administered 2016-12-11: 32.7 via INTRAVENOUS
  Filled 2016-12-11: qty 33

## 2016-12-11 MED ORDER — TECHNETIUM TC 99M TETROFOSMIN IV KIT
11.0000 | PACK | Freq: Once | INTRAVENOUS | Status: AC | PRN
  Administered 2016-12-11: 11 via INTRAVENOUS
  Filled 2016-12-11: qty 11

## 2016-12-12 NOTE — Telephone Encounter (Signed)
Close encounter 

## 2017-02-19 ENCOUNTER — Ambulatory Visit (INDEPENDENT_AMBULATORY_CARE_PROVIDER_SITE_OTHER): Payer: Medicare Other | Admitting: Podiatry

## 2017-02-19 DIAGNOSIS — M79676 Pain in unspecified toe(s): Secondary | ICD-10-CM

## 2017-02-19 DIAGNOSIS — B351 Tinea unguium: Secondary | ICD-10-CM | POA: Diagnosis not present

## 2017-02-21 NOTE — Progress Notes (Signed)
   SUBJECTIVE Patient  presents to office today complaining of elongated, thickened nails. Pain while ambulating in shoes. Patient is unable to trim their own nails.   OBJECTIVE General Patient is awake, alert, and oriented x 3 and in no acute distress. Derm Skin is dry and supple bilateral. Negative open lesions or macerations. Remaining integument unremarkable. Nails are tender, long, thickened and dystrophic with subungual debris, consistent with onychomycosis, 1-5 bilateral. No signs of infection noted. Vasc  DP and PT pedal pulses palpable bilaterally. Temperature gradient within normal limits.  Neuro Epicritic and protective threshold sensation diminished bilaterally.  Musculoskeletal Exam No symptomatic pedal deformities noted bilateral. Muscular strength within normal limits.  ASSESSMENT 1. Onychodystrophic nails 1-5 bilateral with hyperkeratosis of nails.  2. Onychomycosis of nail due to dermatophyte bilateral 3. Pain in foot bilateral  PLAN OF CARE 1. Patient evaluated today.  2. Instructed to maintain good pedal hygiene and foot care.  3. Mechanical debridement of nails 1-5 bilaterally performed using a nail nipper. Filed with dremel without incident.  4. Return to clinic in 3 mos.    Brent M. Evans, DPM [facility]  Dr. Brent M. Evans, DPM    [address]                                        [place], [place] [address]                Office [phone]  Fax [phone]      

## 2017-03-31 ENCOUNTER — Ambulatory Visit (HOSPITAL_COMMUNITY)
Admission: RE | Admit: 2017-03-31 | Discharge: 2017-03-31 | Disposition: A | Discharge status: Home / Self Care | Payer: Medicare Other | Source: Ambulatory Visit | Attending: Cardiology | Admitting: Cardiology

## 2017-03-31 ENCOUNTER — Encounter (HOSPITAL_COMMUNITY): Payer: Self-pay

## 2017-03-31 VITALS — BP 132/84 | HR 64 | Wt 232.5 lb

## 2017-03-31 DIAGNOSIS — I10 Essential (primary) hypertension: Secondary | ICD-10-CM | POA: Diagnosis not present

## 2017-03-31 DIAGNOSIS — Z7902 Long term (current) use of antithrombotics/antiplatelets: Secondary | ICD-10-CM | POA: Diagnosis not present

## 2017-03-31 DIAGNOSIS — Z87891 Personal history of nicotine dependence: Secondary | ICD-10-CM | POA: Diagnosis not present

## 2017-03-31 DIAGNOSIS — I2510 Atherosclerotic heart disease of native coronary artery without angina pectoris: Secondary | ICD-10-CM | POA: Diagnosis not present

## 2017-03-31 DIAGNOSIS — E785 Hyperlipidemia, unspecified: Secondary | ICD-10-CM | POA: Diagnosis not present

## 2017-03-31 DIAGNOSIS — Z8673 Personal history of transient ischemic attack (TIA), and cerebral infarction without residual deficits: Secondary | ICD-10-CM | POA: Diagnosis not present

## 2017-03-31 DIAGNOSIS — G8929 Other chronic pain: Secondary | ICD-10-CM | POA: Insufficient documentation

## 2017-03-31 DIAGNOSIS — Z7982 Long term (current) use of aspirin: Secondary | ICD-10-CM | POA: Diagnosis not present

## 2017-03-31 DIAGNOSIS — I493 Ventricular premature depolarization: Secondary | ICD-10-CM | POA: Diagnosis not present

## 2017-03-31 DIAGNOSIS — R002 Palpitations: Secondary | ICD-10-CM | POA: Diagnosis present

## 2017-03-31 DIAGNOSIS — M545 Low back pain: Secondary | ICD-10-CM | POA: Insufficient documentation

## 2017-03-31 DIAGNOSIS — Z85038 Personal history of other malignant neoplasm of large intestine: Secondary | ICD-10-CM | POA: Insufficient documentation

## 2017-03-31 NOTE — Progress Notes (Signed)
Patient ID: Jose Meyer, male   DOB: [birth year], 71 y.o.   MRN: [id number] PCP: Dr. Darcus Austin Cardiology: Dr. Aundra Dubin  71 yo with history of prior cerebellar CVA, nonobstructive CAD, HTN, hyperlipidemia presents for cardiology followup.  Patient has a history of cerebellar CVA in 2000, documented by MRI.  Symptoms at that time were difficulty with balace and discoordination.  No vertigo at that time.  In 7/10, pt had just gotten dressed for church one Sunday and was not doing anything in particular.  He developed a severe spinning sensation like vertigo that lasted for about 5 minutes.  He was "woozy" for about 1.5 hours after that before finally returning to normal.  He does not remember having the vertigo set off by moving his head in a particular direction.  He went to the ER and had an MRI of his head done, showing only small vessel disease.  There was some concern that this could have been a posterior circulation TIA given prior history of cerebellar CVA.  He did have a TEE in 9/10 showing no PFO, normal LV and RV systolic function, grade III plaque in the aortic arch/descending thoracic aorta.  Patient has known nonobstructive CAD from cath in 2007.  Last echo in 9/13 showed normal EF.   At last appointment in 2/18, he reported several episodes of chest heaviness.  Cardiolite was done in 2/18, no ischemia (low risk study).  Since then, he has had no further chest pain.  Plantar fasciitis has resolved.  He is able to walk 3.5 miles with no difficulty (no dyspnea) and can walk up stairs without problems.  Weight is up 5 lbs.  No palpitations or lightheadedness.  SBP 120s at home.   Labs (8/10): creatinine 1.0, LDL 73 Labs (9/12): K 4.1, creatinine 0.83, LDL 53, HDL 42, TGs 131 Labs (9/13): K 4.1, creatinine 0.9, TSH normal Labs (12/15): LDL 62, HDL 51, LFTs normal, K 4, creatinine 0.98 Labs (12/17): K 3.6, creatinine 0.96, hgb 17.1, LDL 53, HDL 67  ECG (personally reviewed): NSR,  normal  Allergies (verified):  No Known Drug Allergies  Past Medical History: 1. Hyperlipidemia 2. Cerebellar CVA 2000: documented by MRI.  Possible TIA 7/10 with vertigo.  MRI 7/10 showed only small vessel disease.  Possible TIA 7/10.  Carotid dopplers (9/10) with no significant disease.  TEE (9/10): Normal LV and RV size and systolic function.  No PFO.  No significant valvular disease.  At least grade III plaque in the arch and descending thoracic aorta with no ulceration or mobility.  3. HTN 4. History of colon CA s/p R hemicolectomy in 9/09.  5. psoriasis 6. diverticulosis 7. CAD: nonobstructive.  LHC in 2000 without significant disease.  ETT-cardiolite 2007 with fixed inferior defect.  LHC (4/07) showed EF 60%, 30-40% CFX stenosis, 30-40% mRCA stenosis.  Probably false positive cardiolite.  Echo (9/13): EF 55-60%, mild LVH, grade I diastolic dysfunction, mild to moderate LAE. - Cardiolite (2/18): Medium-sized, fixed inferolateral perfusion defect (likely artifact given normal wall motion), no ischemia, EF 64%.  8. Palpitations: PVCs.  30 day event monitor in 9/13 with occasional PVCs but no more significant arrhythmias.   9. Chronic low back pain.  10. Plantar fasciitis  Family History: No premature CAD.   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 Lives in [place].  No smoking since 1975.  Occasional ETOH.  Married with 2 children [facility], cardiologist in [place]).  Enjoys woodworking.  ROS: All systems reviewed and negative except as per HPI.   Current Outpatient Prescriptions  Medication Sig Dispense Refill  . amLODipine (NORVASC) 5 MG tablet Take 5 mg by mouth every morning.    Marland Kitchen aspirin 81 MG tablet Take 81 mg by mouth at bedtime.     Marland Kitchen atorvastatin (LIPITOR) 10 MG tablet Take 10 mg by mouth at bedtime.     . betamethasone dipropionate 0.05 % lotion Apply 8.11 application topically as needed. For the scalp after  shower  0  . clopidogrel (PLAVIX) 75 MG tablet Take 75 mg by mouth daily.     Marland Kitchen ELIDEL 1 % cream Apply 1 application topically as needed. For skin  0  . hydrochlorothiazide (HYDRODIURIL) 25 MG tablet Take 25 mg by mouth every morning.     Marland Kitchen losartan (COZAAR) 50 MG tablet Take 50 mg by mouth every morning.    . metoprolol (TOPROL-XL) 100 MG 24 hr tablet Take 50-100 mg by mouth as directed. TAKE 50 MG IN THE AM AND TAKE 100 MG IN THE PM     No current facility-administered medications for this encounter.     BP 132/84   Pulse 64   Wt 232 lb 8 oz (105.5 kg)   SpO2 98%   BMI 31.53 kg/m  General: NAD Neck: JVP 7 cm, no thyromegaly or thyroid nodule.  Lungs: CTAB CV: Nondisplaced PMI.  Heart regular S1/S2, no S3/S4, no murmur.  No edema.  No carotid bruit.  Normal pedal pulses.  Abdomen: Soft, nontender, no hepatosplenomegaly, no distention.  Neurologic: Alert and oriented x 3.  Psych: Normal affect. Extremities: No clubbing or cyanosis.   Assessment/Plan:  Palpitations Patient has occasional PVCs, which are likely the cause of his periodic palpitations.   CAD  Nonobstructive CAD on last cath in 2007. Cardiolite in 2/18 with no ischemia.  No further chest pain episodes. Good exercise tolerance.  - Continue ASA, Plavix, ARB, Toprol XL, and statin.   HYPERLIPIDEMIA Good lipids in 12/17.   HYPERTENSION   BP has been well-controlled, continue current meds.  CVA  H/o CVA/TIA in the past. He will continue on Plavix. No recent neurological symptoms.   Followup 1 year    Loralie Champagne 03/31/2017

## 2017-03-31 NOTE — Patient Instructions (Signed)
Follow up in 1 year.  We will contact you to schedule this appointment, if you have not heard from [place] by May 2019 please call us to schedule your follow up.

## 2017-06-23 ENCOUNTER — Ambulatory Visit (INDEPENDENT_AMBULATORY_CARE_PROVIDER_SITE_OTHER): Payer: Medicare Other | Admitting: Podiatry

## 2017-06-23 DIAGNOSIS — B351 Tinea unguium: Secondary | ICD-10-CM

## 2017-06-23 DIAGNOSIS — M79676 Pain in unspecified toe(s): Secondary | ICD-10-CM | POA: Diagnosis not present

## 2017-06-27 NOTE — Progress Notes (Signed)
   SUBJECTIVE Patient  presents to office today complaining of elongated, thickened nails. Pain while ambulating in shoes. Patient is unable to trim their own nails.   Past Medical History:  Diagnosis Date  . Colon cancer [facility])    s/p R hemicolectomy 06/2008  . Coronary artery disease    Non-Obstructive;  LHC 2000 no sig dz; ETT-Cardiolite 2007 with fixed Inf defect;  LHC 4/07:  EF 60%, CFX 30-40%, mRCA 30-40% (prob false + CLite)  . CVA (cerebral infarction)    cerebellar CVA 2000: doc by MRI; Poss TIA 7/10 with vertigo; MRI 7/10 => only small vessel disease; Carotids 9/10: no sig disease;  TEE 9/10: normal LV and RV size and systolic fxn, no PFO, no sig valvular dz, at least Gr III plaque in arch and desc thoracic aorta (no ulceration or mobility)  . Diverticulosis   . H/O echocardiogram    Echo 9/13: EF 55-60%, mod LVH, grade 1 diast dysfn, mild to mod LAE  . Hyperlipidemia   . Hypertension   . Psoriasis     OBJECTIVE General Patient is awake, alert, and oriented x 3 and in no acute distress. Derm Skin is dry and supple bilateral. Negative open lesions or macerations. Remaining integument unremarkable. Nails are tender, long, thickened and dystrophic with subungual debris, consistent with onychomycosis, 1-5 bilateral. No signs of infection noted. Vasc  DP and PT pedal pulses palpable bilaterally. Temperature gradient within normal limits.  Neuro Epicritic and protective threshold sensation diminished bilaterally.  Musculoskeletal Exam No symptomatic pedal deformities noted bilateral. Muscular strength within normal limits.  ASSESSMENT 1. Onychodystrophic nails 1-5 bilateral with hyperkeratosis of nails.  2. Onychomycosis of nail due to dermatophyte bilateral 3. Pain in foot bilateral  PLAN OF CARE 1. Patient evaluated today.  2. Instructed to maintain good pedal hygiene and foot care.  3. Mechanical debridement of nails 1-5 bilaterally performed using a nail nipper. Filed with  dremel without incident.  4. Return to clinic in 3 mos.    Edrick Kins, DPM [facility]  Dr. Edrick Kins, [address]                                        [place], [place] [address]                Office [phone]  Fax [phone]

## 2017-07-01 DIAGNOSIS — H903 Sensorineural hearing loss, bilateral: Secondary | ICD-10-CM | POA: Insufficient documentation

## 2017-09-22 ENCOUNTER — Ambulatory Visit: Payer: Medicare Other | Admitting: Podiatry

## 2017-09-29 ENCOUNTER — Ambulatory Visit: Payer: Medicare Other | Admitting: Podiatry

## 2017-09-29 DIAGNOSIS — B351 Tinea unguium: Secondary | ICD-10-CM | POA: Diagnosis not present

## 2017-09-29 DIAGNOSIS — M79676 Pain in unspecified toe(s): Secondary | ICD-10-CM

## 2017-10-04 NOTE — Progress Notes (Signed)
   SUBJECTIVE Patient presents to office today complaining of elongated, thickened nails. Pain while ambulating in shoes. Patient is unable to trim their own nails.   Past Medical History:  Diagnosis Date  . Colon cancer [facility])    s/p R hemicolectomy 06/2008  . Coronary artery disease    Non-Obstructive;  LHC 2000 no sig dz; ETT-Cardiolite 2007 with fixed Inf defect;  LHC 4/07:  EF 60%, CFX 30-40%, mRCA 30-40% (prob false + CLite)  . CVA (cerebral infarction)    cerebellar CVA 2000: doc by MRI; Poss TIA 7/10 with vertigo; MRI 7/10 => only small vessel disease; Carotids 9/10: no sig disease;  TEE 9/10: normal LV and RV size and systolic fxn, no PFO, no sig valvular dz, at least Gr III plaque in arch and desc thoracic aorta (no ulceration or mobility)  . Diverticulosis   . H/O echocardiogram    Echo 9/13: EF 55-60%, mod LVH, grade 1 diast dysfn, mild to mod LAE  . Hyperlipidemia   . Hypertension   . Psoriasis     OBJECTIVE General Patient is awake, alert, and oriented x 3 and in no acute distress. Derm Skin is dry and supple bilateral. Negative open lesions or macerations. Remaining integument unremarkable. Nails are tender, long, thickened and dystrophic with subungual debris, consistent with onychomycosis, 1-5 bilateral. No signs of infection noted. Vasc  DP and PT pedal pulses palpable bilaterally. Temperature gradient within normal limits.  Neuro Epicritic and protective threshold sensation diminished bilaterally.  Musculoskeletal Exam No symptomatic pedal deformities noted bilateral. Muscular strength within normal limits.  ASSESSMENT 1. Onychodystrophic nails 1-5 bilateral with hyperkeratosis of nails.  2. Onychomycosis of nail due to dermatophyte bilateral 3. Pain in foot bilateral  PLAN OF CARE 1. Patient evaluated today.  2. Instructed to maintain good pedal hygiene and foot care.  3. Mechanical debridement of nails 1-5 bilaterally performed using a nail nipper. Filed with  dremel without incident.  4. Return to clinic in 3 mos.    Edrick Kins, DPM [facility]  Dr. Edrick Kins, [address]                                        [place], [place] [address]                Office [phone]  Fax [phone]

## 2017-12-24 ENCOUNTER — Ambulatory Visit: Payer: Medicare Other | Admitting: Podiatry

## 2017-12-24 ENCOUNTER — Encounter: Payer: Self-pay | Admitting: Podiatry

## 2017-12-24 DIAGNOSIS — B351 Tinea unguium: Secondary | ICD-10-CM | POA: Diagnosis not present

## 2017-12-24 DIAGNOSIS — M79676 Pain in unspecified toe(s): Secondary | ICD-10-CM | POA: Diagnosis not present

## 2017-12-25 NOTE — Progress Notes (Signed)
   SUBJECTIVE Patient presents to office today complaining of elongated, thickened nails that cause pain while ambulating in shoes. He is unable to trim his own nails. Patient is here for further evaluation and treatment.   Past Medical History:  Diagnosis Date  . Colon cancer [facility])    s/p R hemicolectomy 06/2008  . Coronary artery disease    Non-Obstructive;  LHC 2000 no sig dz; ETT-Cardiolite 2007 with fixed Inf defect;  LHC 4/07:  EF 60%, CFX 30-40%, mRCA 30-40% (prob false + CLite)  . CVA (cerebral infarction)    cerebellar CVA 2000: doc by MRI; Poss TIA 7/10 with vertigo; MRI 7/10 => only small vessel disease; Carotids 9/10: no sig disease;  TEE 9/10: normal LV and RV size and systolic fxn, no PFO, no sig valvular dz, at least Gr III plaque in arch and desc thoracic aorta (no ulceration or mobility)  . Diverticulosis   . H/O echocardiogram    Echo 9/13: EF 55-60%, mod LVH, grade 1 diast dysfn, mild to mod LAE  . Hyperlipidemia   . Hypertension   . Psoriasis     OBJECTIVE General Patient is awake, alert, and oriented x 3 and in no acute distress. Derm Skin is dry and supple bilateral. Negative open lesions or macerations. Remaining integument unremarkable. Nails are tender, long, thickened and dystrophic with subungual debris, consistent with onychomycosis, 1-5 bilateral. No signs of infection noted. Vasc  DP and PT pedal pulses palpable bilaterally. Temperature gradient within normal limits.  Neuro Epicritic and protective threshold sensation diminished bilaterally.  Musculoskeletal Exam No symptomatic pedal deformities noted bilateral. Muscular strength within normal limits.  ASSESSMENT 1. Onychodystrophic nails 1-5 bilateral with hyperkeratosis of nails.  2. Onychomycosis of nail due to dermatophyte bilateral 3. Pain in foot bilateral  PLAN OF CARE 1. Patient evaluated today.  2. Instructed to maintain good pedal hygiene and foot care.  3. Mechanical debridement of nails 1-5  bilaterally performed using a nail nipper. Filed with dremel without incident.  4. Return to clinic in 3 mos.   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    Edrick Kins, DPM [facility]  Dr. Edrick Kins, [address]                                        [place],  [address]                Office [phone]  Fax [phone]

## 2017-12-29 ENCOUNTER — Ambulatory Visit: Payer: Medicare Other | Admitting: Podiatry

## 2018-01-05 IMAGING — NM NM MISC PROCEDURE
3 series · 18 of 18 positions shown · non-contrast
Comparison: none

[Series 1: stress-gsp_(id)_sa · 6.4mm · 6.40mm/px · 6 of 512 frames shown]
[frame 43/512]
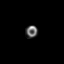
[frame 128/512]
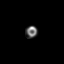
[frame 214/512]
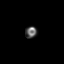
[frame 299/512]
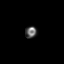
[frame 384/512]
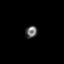
[frame 470/512]
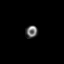

[Series 1: stress-sum-em_(id)_sa · 6.4mm · 6.40mm/px · 6 of 64 frames shown]
[frame 6/64]
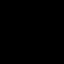
[frame 16/64]
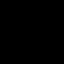
[frame 27/64]
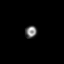
[frame 38/64]
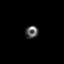
[frame 48/64]
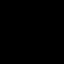
[frame 59/64]
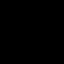

[Series 1: rest_(id)_sa · 6.4mm · 6.40mm/px · 6 of 64 frames shown]
[frame 6/64]
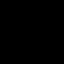
[frame 16/64]
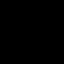
[frame 27/64]
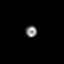
[frame 38/64]
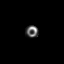
[frame 48/64]
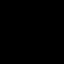
[frame 59/64]
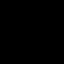

[18 of 18 positions shown; findings below may reference images not displayed]

Canned report from images found in remote index.

Refer to host system for actual result text.

## 2018-03-25 ENCOUNTER — Ambulatory Visit: Payer: Medicare Other | Admitting: Podiatry

## 2018-03-25 DIAGNOSIS — M79676 Pain in unspecified toe(s): Secondary | ICD-10-CM

## 2018-03-25 DIAGNOSIS — B351 Tinea unguium: Secondary | ICD-10-CM | POA: Diagnosis not present

## 2018-04-04 NOTE — Progress Notes (Signed)
   SUBJECTIVE Patient presents to office today complaining of elongated, thickened nails that cause pain while ambulating in shoes. He is unable to trim his own nails. Patient is here for further evaluation and treatment.   Past Medical History:  Diagnosis Date  . Colon cancer [facility])    s/p R hemicolectomy 06/2008  . Coronary artery disease    Non-Obstructive;  LHC 2000 no sig dz; ETT-Cardiolite 2007 with fixed Inf defect;  LHC 4/07:  EF 60%, CFX 30-40%, mRCA 30-40% (prob false + CLite)  . CVA (cerebral infarction)    cerebellar CVA 2000: doc by MRI; Poss TIA 7/10 with vertigo; MRI 7/10 => only small vessel disease; Carotids 9/10: no sig disease;  TEE 9/10: normal LV and RV size and systolic fxn, no PFO, no sig valvular dz, at least Gr III plaque in arch and desc thoracic aorta (no ulceration or mobility)  . Diverticulosis   . H/O echocardiogram    Echo 9/13: EF 55-60%, mod LVH, grade 1 diast dysfn, mild to mod LAE  . Hyperlipidemia   . Hypertension   . Psoriasis     OBJECTIVE General Patient is awake, alert, and oriented x 3 and in no acute distress. Derm Skin is dry and supple bilateral. Negative open lesions or macerations. Remaining integument unremarkable. Nails are tender, long, thickened and dystrophic with subungual debris, consistent with onychomycosis, 1-5 bilateral. No signs of infection noted. Vasc  DP and PT pedal pulses palpable bilaterally. Temperature gradient within normal limits.  Neuro Epicritic and protective threshold sensation grossly intact bilaterally.  Musculoskeletal Exam No symptomatic pedal deformities noted bilateral. Muscular strength within normal limits.  ASSESSMENT 1. Onychodystrophic nails 1-5 bilateral with hyperkeratosis of nails.  2. Onychomycosis of nail due to dermatophyte bilateral 3. Pain in foot bilateral  PLAN OF CARE 1. Patient evaluated today.  2. Instructed to maintain good pedal hygiene and foot care.  3. Mechanical debridement of nails  1-5 bilaterally performed using a nail nipper. Filed with dremel without incident.  4. Return to clinic in 3 mos.   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    Edrick Kins, DPM [facility]  Dr. Edrick Kins, [address]                                        [place], [place] [address]                Office [phone]  Fax [phone]

## 2018-06-23 ENCOUNTER — Ambulatory Visit (HOSPITAL_COMMUNITY)
Admission: RE | Admit: 2018-06-23 | Discharge: 2018-06-23 | Disposition: A | Discharge status: Home / Self Care | Payer: Medicare Other | Source: Ambulatory Visit | Attending: Cardiology | Admitting: Cardiology

## 2018-06-23 VITALS — BP 128/72 | HR 70 | Wt 242.0 lb

## 2018-06-23 DIAGNOSIS — Z7902 Long term (current) use of antithrombotics/antiplatelets: Secondary | ICD-10-CM | POA: Insufficient documentation

## 2018-06-23 DIAGNOSIS — Z85038 Personal history of other malignant neoplasm of large intestine: Secondary | ICD-10-CM | POA: Insufficient documentation

## 2018-06-23 DIAGNOSIS — I2510 Atherosclerotic heart disease of native coronary artery without angina pectoris: Secondary | ICD-10-CM | POA: Diagnosis not present

## 2018-06-23 DIAGNOSIS — M545 Low back pain: Secondary | ICD-10-CM | POA: Insufficient documentation

## 2018-06-23 DIAGNOSIS — I444 Left anterior fascicular block: Secondary | ICD-10-CM | POA: Diagnosis not present

## 2018-06-23 DIAGNOSIS — I10 Essential (primary) hypertension: Secondary | ICD-10-CM

## 2018-06-23 DIAGNOSIS — Z8673 Personal history of transient ischemic attack (TIA), and cerebral infarction without residual deficits: Secondary | ICD-10-CM | POA: Insufficient documentation

## 2018-06-23 DIAGNOSIS — E785 Hyperlipidemia, unspecified: Secondary | ICD-10-CM | POA: Insufficient documentation

## 2018-06-23 DIAGNOSIS — Z7982 Long term (current) use of aspirin: Secondary | ICD-10-CM | POA: Insufficient documentation

## 2018-06-23 DIAGNOSIS — G8929 Other chronic pain: Secondary | ICD-10-CM | POA: Insufficient documentation

## 2018-06-23 DIAGNOSIS — Z79899 Other long term (current) drug therapy: Secondary | ICD-10-CM | POA: Diagnosis not present

## 2018-06-23 LAB — LIPID PANEL
Cholesterol: 129 mg/dL (ref 0–200)
HDL: 47 mg/dL (ref >40)
LDL Cholesterol: 59 mg/dL (ref 0–99)
Total CHOL/HDL Ratio: 2.7 ratio
Triglycerides: 116 mg/dL (ref <150)
VLDL: 23 mg/dL (ref 0–40)

## 2018-06-23 NOTE — Patient Instructions (Signed)
Labs today (will call for abnormal results, otherwise no news is good news)  Follow up in 1 year--Please call our clinic in August 2020 to schedule your appointment. [phone], Option 3.

## 2018-06-23 NOTE — Progress Notes (Signed)
Patient ID: Jose Meyer, male   DOB: 1946/03/01, 72 y.o.   MRN: [id number] PCP: Dr. Darcus Austin Cardiology: Dr. Aundra Dubin  72 y.o.with history of prior cerebellar CVA, nonobstructive CAD, HTN, hyperlipidemia presents for followup of CAD, HTN, CVA.  Patient has a history of cerebellar CVA in 2000, documented by MRI.  Symptoms at that time were difficulty with balace and discoordination.  No vertigo at that time.  In 7/10, pt had just gotten dressed for church one Sunday and was not doing anything in particular.  He developed a severe spinning sensation like vertigo that lasted for about 5 minutes.  He was "woozy" for about 1.5 hours after that before finally returning to normal.  He does not remember having the vertigo set off by moving his head in a particular direction.  He went to the ER and had an MRI of his head done, showing only small vessel disease.  There was some concern that this could have been a posterior circulation TIA given prior history of cerebellar CVA.  He did have a TEE in 9/10 showing no PFO, normal LV and RV systolic function, grade III plaque in the aortic arch/descending thoracic aorta.  Patient has known nonobstructive CAD from cath in 2007.  Last echo in 9/13 showed normal EF. Cardiolite was done in 2/18, no ischemia (low risk study).    Patient has been doing well over the last year.  He walks about 1.5 miles 5 days a week.  No chest pain or exertional dyspnea. Weight has trended up since last year, but he is now working on getting it back down.  SBP running < 130 when he checks at home, good BP today.  No lightheadedness or palpitations.  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   Labs (8/10): creatinine 1.0, LDL 73 Labs (9/12): K 4.1, creatinine 0.83, LDL 53, HDL 42, TGs 131 Labs (9/13): K 4.1, creatinine 0.9, TSH normal Labs (12/15): LDL 62, HDL 51, LFTs normal, K 4, creatinine 0.98 Labs (12/17): K 3.6, creatinine 0.96, hgb 17.1, LDL 53, HDL  67 Labs (1/19): LDL 54, HDL 50 Labs (7/19): K 4.2, creatinine 0.88  ECG (personally reviewed): NSR, normal  Allergies (verified):  No Known Drug Allergies  Past Medical History: 1. Hyperlipidemia 2. Cerebellar CVA 2000: documented by MRI.  Possible TIA 7/10 with vertigo.  MRI 7/10 showed only small vessel disease.  Possible TIA 7/10.  Carotid dopplers (9/10) with no significant disease.  TEE (9/10): Normal LV and RV size and systolic function.  No PFO.  No significant valvular disease.  At least grade III plaque in the arch and descending thoracic aorta with no ulceration or mobility.  3. HTN 4. History of colon CA s/p R hemicolectomy in 9/09.  5. psoriasis 6. diverticulosis 7. CAD: nonobstructive.  LHC in 2000 without significant disease.  ETT-cardiolite 2007 with fixed inferior defect.  LHC (4/07) showed EF 60%, 30-40% CFX stenosis, 30-40% mRCA stenosis.  Probably false positive cardiolite.  Echo (9/13): EF 55-60%, mild LVH, grade I diastolic dysfunction, mild to moderate LAE. - Cardiolite (2/18): Medium-sized, fixed inferolateral perfusion defect (likely artifact given normal wall motion), no ischemia, EF 64%.  8. Palpitations: PVCs.  30 day event monitor in 9/13 with occasional PVCs but no more significant arrhythmias.   9. Chronic low back pain.  10. Plantar fasciitis  Family History: No premature CAD.   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 Lives in [place].  No smoking since 1975.  Occasional ETOH.  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  Enjoys woodworking.  ROS: All systems reviewed and negative except as per HPI.   Current Outpatient Medications  Medication Sig Dispense Refill  . amLODipine (NORVASC) 5 MG tablet Take 5 mg by mouth every morning.    Marland Kitchen aspirin 81 MG tablet Take 81 mg by mouth at bedtime.     Marland Kitchen atorvastatin (LIPITOR) 10 MG tablet Take 10 mg by mouth at bedtime.     .  betamethasone dipropionate 0.05 % lotion Apply 5.10 application topically as needed. For the scalp after shower  0  . clopidogrel (PLAVIX) 75 MG tablet Take 75 mg by mouth daily.     Marland Kitchen ELIDEL 1 % cream Apply 1 application topically as needed. For skin  0  . hydrochlorothiazide (HYDRODIURIL) 25 MG tablet Take 25 mg by mouth every morning.     Marland Kitchen losartan (COZAAR) 50 MG tablet Take 50 mg by mouth every morning.    . metoprolol (TOPROL-XL) 100 MG 24 hr tablet Take 50-100 mg by mouth as directed. TAKE 50 MG IN THE AM AND TAKE 100 MG IN THE PM     No current facility-administered medications for this encounter.     BP 128/72   Pulse 70   Wt 109.8 kg (242 lb)   SpO2 95%   BMI 32.82 kg/m  General: NAD Neck: No JVD, no thyromegaly or thyroid nodule.  Lungs: Clear to auscultation bilaterally with normal respiratory effort. CV: Nondisplaced PMI.  Heart regular S1/S2, no S3/S4, no murmur.  No peripheral edema.  No carotid bruit.  Normal pedal pulses.  Abdomen: Soft, nontender, no hepatosplenomegaly, no distention.  Skin: Intact without lesions or rashes.  Neurologic: Alert and oriented x 3.  Psych: Normal affect. Extremities: No clubbing or cyanosis.  HEENT: Normal.   Assessment/Plan:  CAD  Nonobstructive CAD on last cath in 2007. Cardiolite in 2/18 with no ischemia.  No further chest pain episodes. Good exercise tolerance.  - Continue ASA, Plavix, ARB, Toprol XL, and statin.   HYPERLIPIDEMIA Check lipids today.  HYPERTENSION   BP has been well-controlled, continue current meds.  CVA  H/o CVA/TIA in the past. He will continue on Plavix. No recent neurological symptoms.   Followup 1 year    Loralie Champagne 06/23/2018

## 2018-06-26 ENCOUNTER — Ambulatory Visit: Payer: Medicare Other | Admitting: Podiatry

## 2018-06-26 ENCOUNTER — Encounter: Payer: Self-pay | Admitting: Podiatry

## 2018-06-26 DIAGNOSIS — M79675 Pain in left toe(s): Secondary | ICD-10-CM

## 2018-06-26 DIAGNOSIS — Z9229 Personal history of other drug therapy: Secondary | ICD-10-CM

## 2018-06-26 DIAGNOSIS — M79674 Pain in right toe(s): Secondary | ICD-10-CM | POA: Diagnosis not present

## 2018-06-26 DIAGNOSIS — B351 Tinea unguium: Secondary | ICD-10-CM | POA: Diagnosis not present

## 2018-06-26 DIAGNOSIS — B353 Tinea pedis: Secondary | ICD-10-CM | POA: Diagnosis not present

## 2018-06-26 MED ORDER — CLOTRIMAZOLE 1 % EX CREA: TOPICAL_CREAM | CUTANEOUS | 1 refills | Status: DC

## 2018-06-26 NOTE — Patient Instructions (Signed)
Athlete's Foot Athlete's foot (tinea pedis) is a fungal infection of the skin on the feet. It often occurs on the skin that is between or underneath the toes. It can also occur on the soles of the feet. The infection can spread from person to person (is contagious). What are the causes? Athlete's foot is caused by a fungus. This fungus grows in warm, moist places. Most people get athlete's foot by sharing shower stalls, towels, and wet floors with someone who is infected. Not washing your feet or changing your socks often enough can contribute to athlete's foot. What increases the risk? This condition is more likely to develop in:  Men.  People who have a weak body defense system (immune system).  People who have diabetes.  People who use public showers, such as at a gym.  People who wear heavy-duty shoes, such as industrial or military shoes.  Seasons with warm, humid weather.  What are the signs or symptoms? Symptoms of this condition include:  Itchy areas between the toes or on the soles of the feet.  White, flaky, or scaly areas between the toes or on the soles of the feet.  Very itchy small blisters between the toes or on the soles of the feet.  Small cuts on the skin. These cuts can become infected.  Thick or discolored toenails.  How is this diagnosed? This condition is diagnosed with a medical history and physical exam. Your health care provider may also take a skin or toenail sample to be examined. How is this treated? Treatment for this condition includes antifungal medicines. These may be applied as powders, ointments, or creams. In severe cases, an oral antifungal medicine may be given. Follow these instructions at home:  Apply or take over-the-counter and prescription medicines only as told by your health care provider.  Keep all follow-up visits as told by your health care provider. This is important.  Do not scratch your feet.  Keep your feet dry: ? Wear  cotton or wool socks. Change your socks every day or if they become wet. ? Wear shoes that allow air to circulate, such as sandals or canvas tennis shoes.  Wash and dry your feet: ? Every day or as told by your health care provider. ? After exercising. ? Including the area between your toes.  Do not share towels, nail clippers, or other personal items that touch your feet with others.  If you have diabetes, keep your blood sugar under control. How is this prevented?  Do not share towels.  Wear sandals in wet areas, such as locker rooms and shared showers.  Keep your feet dry: ? Wear cotton or wool socks. Change your socks every day or if they become wet. ? Wear shoes that allow air to circulate, such as sandals or canvas tennis shoes.  Wash and dry your feet after exercising. Pay attention to the area between your toes. Contact a health care provider if:  You have a fever.  You have swelling, soreness, warmth, or redness in your foot.  You are not getting better with treatment.  Your symptoms get worse.  You have new symptoms. This information is not intended to replace advice given to you by your health care provider. Make sure you discuss any questions you have with your health care provider. Document Released: 09/27/2000 Document Revised: 03/07/2016 Document Reviewed: 04/03/2015 Elsevier Interactive Patient Education  2018 Elsevier Inc.  

## 2018-06-28 NOTE — Progress Notes (Signed)
Subjective: Jose Meyer is a 72 y.o. y.o. male who presents to clinic today with cc of painful, discolored, thick toenails which interfere with daily activities. Pain is aggravated when wearing enclosed shoe gear. Pain is relieved with periodic professional debridement.  Mr. Fabio Neighbors voices no new pedal concerns on today's visit.  Objective:  General:   72 y.o. WM, WD, WN in NAD. AAO x 3. No signs of abuse nor neglect.  Vascular Examination: Capillary refill time immediate x 10 digits Dorsalis pedis pulses and Posterior tibial pulses present b/l No digital hair x 10 digits Skin temperature gradient WNL b/l  Dermatological Examination: Scaling noted plantarly and peripherally BLE Toenails 1-5 b/l discolored, thick, dystrophic with subungual debris and pain with palpation to nailbeds due to thickness of nails.  Musculoskeletal: Muscle strength 5/5 to all LE muscle groups  Neurological: Sensation intact with 10 gram monofilament. Vibratory sensation intact.  Assessment: 1. Painful onychomycosis toenails 1-5 b/l in patient on blood thinner.  2. Tinea pedis b/l  Plan: 1. Toenails 1-5 b/l were debrided in length and girth without iatrogenic bleeding. 2. Rx for Clotrimazole Cream 1% to be applied to both feet and between toes twice daily. 3. Patient to continue soft, supportive shoe gear 4. Patient to report any pedal injuries to medical professional immediately. 5. Avoid self trimming due to use of blood thinner. 6. Follow up 3 months.  7. Patient/POA to call should there be a concern in the interim.

## 2018-09-25 ENCOUNTER — Ambulatory Visit: Payer: Medicare Other | Admitting: Podiatry

## 2018-09-25 DIAGNOSIS — B351 Tinea unguium: Secondary | ICD-10-CM

## 2018-09-25 DIAGNOSIS — M79675 Pain in left toe(s): Secondary | ICD-10-CM

## 2018-09-25 DIAGNOSIS — M79674 Pain in right toe(s): Secondary | ICD-10-CM | POA: Diagnosis not present

## 2018-09-25 NOTE — Patient Instructions (Signed)

## 2018-10-29 ENCOUNTER — Encounter: Payer: Self-pay | Admitting: Podiatry

## 2018-10-29 NOTE — Progress Notes (Signed)
Subjective: Jose Meyer is a 73 y.o. y.o. male who presents today with painful, discolored, thick toenails  which interfere with daily activities. Pain is aggravated when wearing enclosed shoe gear. Pain is relieved with periodic professional debridement.    Current Outpatient Medications:  .  amLODipine (NORVASC) 5 MG tablet, Take 5 mg by mouth every morning., Disp: , Rfl:  .  aspirin 81 MG tablet, Take 81 mg by mouth at bedtime. , Disp: , Rfl:  .  atorvastatin (LIPITOR) 10 MG tablet, Take 10 mg by mouth at bedtime. , Disp: , Rfl:  .  betamethasone dipropionate 0.05 % lotion, Apply 7.00 application topically as needed. For the scalp after shower, Disp: , Rfl: 0 .  clopidogrel (PLAVIX) 75 MG tablet, Take 75 mg by mouth daily. , Disp: , Rfl:  .  clotrimazole (LOTRIMIN) 1 % cream, Apply to both feet and between toes twice daily, Disp: 30 g, Rfl: 1 .  ELIDEL 1 % cream, Apply 1 application topically as needed. For skin, Disp: , Rfl: 0 .  fluorometholone (FML) 0.1 % ophthalmic suspension, INT 1 GTT INTO OU UTD PRN, Disp: , Rfl: 4 .  hydrochlorothiazide (HYDRODIURIL) 25 MG tablet, Take 25 mg by mouth every morning. , Disp: , Rfl:  .  losartan (COZAAR) 50 MG tablet, Take 50 mg by mouth every morning., Disp: , Rfl:  .  metoprolol (TOPROL-XL) 100 MG 24 hr tablet, Take 50-100 mg by mouth as directed. TAKE 50 MG IN THE AM AND TAKE 100 MG IN THE PM, Disp: , Rfl:    No Known Allergies   Objective: Vascular Examination: Capillary refill time immediate x 10 digits Dorsalis pedis and Posterior tibial pulses present b/l Digital hair x 10 digits absent Skin temperature gradient WNL b/l  Dermatological Examination: Skin with normal turgor, texture and tone b/l  Toenails 1-5 b/l discolored, thick, dystrophic with subungual debris and pain with palpation to nailbeds due to thickness of nails.  Musculoskeletal: Muscle strength 5/5 to all LE muscle groups  Neurological: Sensation intact with 10  gram monofilament. Vibratory sensation intact.  Assessment: Painful onychomycosis toenails 1-5 b/l in patient on blood thinner.   Plan: 1. Toenails 1-5 b/l were debrided in length and girth without iatrogenic bleeding. 2. Patient to continue soft, supportive shoe gear 3. Patient to report any pedal injuries to medical professional immediately. 4. Avoid self trimming due to use of blood thinner. 5. Follow up 3 months. Patient/POA to call should there be a concern in the interim.

## 2018-12-25 ENCOUNTER — Ambulatory Visit: Payer: Medicare Other | Admitting: Podiatry

## 2018-12-25 ENCOUNTER — Other Ambulatory Visit: Payer: Self-pay

## 2018-12-25 DIAGNOSIS — M79675 Pain in left toe(s): Secondary | ICD-10-CM | POA: Diagnosis not present

## 2018-12-25 DIAGNOSIS — M79674 Pain in right toe(s): Secondary | ICD-10-CM | POA: Diagnosis not present

## 2018-12-25 DIAGNOSIS — B351 Tinea unguium: Secondary | ICD-10-CM

## 2018-12-25 NOTE — Patient Instructions (Signed)
Athlete's Foot  Athlete's foot (tinea pedis) is a fungal infection of the skin on your feet. It often occurs on the skin that is between or underneath the toes. It can also occur on the soles of your feet. The infection can spread from person to person (is contagious). It can also spread when a person's bare feet come in contact with the fungus on shower floors or on items such as shoes. What are the causes? This condition is caused by a fungus that grows in warm, moist places. You can get athlete's foot by sharing shoes, shower stalls, towels, and wet floors with someone who is infected. Not washing your feet or changing your socks often enough can also lead to athlete's foot. What increases the risk? This condition is more likely to develop in:  Men.  People who have a weak body defense system (immune system).  People who have diabetes.  People who use public showers, such as at a gym.  People who wear heavy-duty shoes, such as industrial or military shoes.  Seasons with warm, humid weather. What are the signs or symptoms? Symptoms of this condition include:  Itchy areas between your toes or on the soles of your feet.  White, flaky, or scaly areas between your toes or on the soles of your feet.  Very itchy small blisters between your toes or on the soles of your feet.  Small cuts in your skin. These cuts can become infected.  Thick or discolored toenails. How is this diagnosed? This condition may be diagnosed with a physical exam and a review of your medical history. Your health care provider may also take a skin or toenail sample to examine under a microscope. How is this treated? This condition is treated with antifungal medicines. These may be applied as powders, ointments, or creams. In severe cases, an oral antifungal medicine may be given. Follow these instructions at home: Medicines  Apply or take over-the-counter and prescription medicines only as told by your health  care provider.  Apply your antifungal medicine as told by your health care provider. Do not stop using the antifungal even if your condition improves. Foot care  Do not scratch your feet.  Keep your feet dry: ? Wear cotton or wool socks. Change your socks every day or if they become wet. ? Wear shoes that allow air to flow, such as sandals or canvas tennis shoes.  Wash and dry your feet, including the area between your toes. Also, wash and dry your feet: ? Every day or as told by your health care provider. ? After exercising. General instructions  Do not let others use towels, shoes, nail clippers, or other personal items that touch your feet.  Protect your feet by wearing sandals in wet areas, such as locker rooms and shared showers.  Keep all follow-up visits as told by your health care provider. This is important.  If you have diabetes, keep your blood sugar under control. Contact a health care provider if:  You have a fever.  You have swelling, soreness, warmth, or redness in your foot.  Your feet are not getting better with treatment.  Your symptoms get worse.  You have new symptoms. Summary  Athlete's foot (tinea pedis) is a fungal infection of the skin on your feet. It often occurs on skin that is between or underneath the toes.  This condition is caused by a fungus that grows in warm, moist places.  Symptoms include white, flaky, or scaly areas between   your toes or on the soles of your feet.  This condition is treated with antifungal medicines.  Keep your feet clean. Always dry them thoroughly. This information is not intended to replace advice given to you by your health care provider. Make sure you discuss any questions you have with your health care provider. Document Released: 09/27/2000 Document Revised: 07/21/2017 Document Reviewed: 07/21/2017 Elsevier Interactive Patient Education  2019 Elsevier Inc. Onychomycosis/Fungal Toenails  WHAT IS IT? An  infection that lies within the keratin of your nail plate that is caused by a fungus.  WHY ME? Fungal infections affect all ages, sexes, races, and creeds.  There may be many factors that predispose you to a fungal infection such as age, coexisting medical conditions such as diabetes, or an autoimmune disease; stress, medications, fatigue, genetics, etc.  Bottom line: fungus thrives in a warm, moist environment and your shoes offer such a location.  IS IT CONTAGIOUS? Theoretically, yes.  You do not want to share shoes, nail clippers or files with someone who has fungal toenails.  Walking around barefoot in the same room or sleeping in the same bed is unlikely to transfer the organism.  It is important to realize, however, that fungus can spread easily from one nail to the next on the same foot.  HOW DO WE TREAT THIS?  There are several ways to treat this condition.  Treatment may depend on many factors such as age, medications, pregnancy, liver and kidney conditions, etc.  It is best to ask your doctor which options are available to you.  1. No treatment.   Unlike many other medical concerns, you can live with this condition.  However for many people this can be a painful condition and may lead to ingrown toenails or a bacterial infection.  It is recommended that you keep the nails cut short to help reduce the amount of fungal nail. 2. Topical treatment.  These range from herbal remedies to prescription strength nail lacquers.  About 40-50% effective, topicals require twice daily application for approximately 9 to 12 months or until an entirely new nail has grown out.  The most effective topicals are medical grade medications available through physicians offices. 3. Oral antifungal medications.  With an 80-90% cure rate, the most common oral medication requires 3 to 4 months of therapy and stays in your system for a year as the new nail grows out.  Oral antifungal medications do require blood work to make  sure it is a safe drug for you.  A liver function panel will be performed prior to starting the medication and after the first month of treatment.  It is important to have the blood work performed to avoid any harmful side effects.  In general, this medication safe but blood work is required. 4. Laser Therapy.  This treatment is performed by applying a specialized laser to the affected nail plate.  This therapy is noninvasive, fast, and non-painful.  It is not covered by insurance and is therefore, out of pocket.  The results have been very good with a 80-95% cure rate.  The [facility] is the only practice in the area to offer this therapy. 5. Permanent Nail Avulsion.  Removing the entire nail so that a new nail will not grow back. 

## 2019-01-04 ENCOUNTER — Encounter: Payer: Self-pay | Admitting: Podiatry

## 2019-01-04 NOTE — Progress Notes (Signed)
Subjective: Jose Meyer is a 73 y.o. y.o. male who is on long term blood thinner, Plavix,  presents today with painful, discolored, thick toenails  which interfere with daily activities. Pain is aggravated when wearing enclosed shoe gear. Pain is relieved with periodic professional debridement.  Jose Meyer relates his toenail are really painful and he feels 3 months is too long between visits.   Current Outpatient Medications:  .  amLODipine (NORVASC) 5 MG tablet, Take 5 mg by mouth every morning., Disp: , Rfl:  .  aspirin 81 MG tablet, Take 81 mg by mouth at bedtime. , Disp: , Rfl:  .  atorvastatin (LIPITOR) 10 MG tablet, Take 10 mg by mouth at bedtime. , Disp: , Rfl:  .  augmented betamethasone dipropionate (DIPROLENE-AF) 0.05 % ointment, APP EXT AA QD, Disp: , Rfl:  .  betamethasone dipropionate 0.05 % lotion, Apply 2.50 application topically as needed. For the scalp after shower, Disp: , Rfl: 0 .  clopidogrel (PLAVIX) 75 MG tablet, Take 75 mg by mouth daily. , Disp: , Rfl:  .  clotrimazole (LOTRIMIN) 1 % cream, Apply to both feet and between toes twice daily, Disp: 30 g, Rfl: 1 .  ELIDEL 1 % cream, Apply 1 application topically as needed. For skin, Disp: , Rfl: 0 .  fluorometholone (FML) 0.1 % ophthalmic suspension, INT 1 GTT INTO OU UTD PRN, Disp: , Rfl: 4 .  fluticasone (FLONASE) 50 MCG/ACT nasal spray, SHAKE LQ AND U 1 SPR IEN QD, Disp: , Rfl:  .  hydrochlorothiazide (HYDRODIURIL) 25 MG tablet, Take 25 mg by mouth every morning. , Disp: , Rfl:  .  ipratropium (ATROVENT) 0.06 % nasal spray, U 2 SPRAYS IN EACH NOSTRIL TID, Disp: , Rfl:  .  losartan (COZAAR) 50 MG tablet, Take 50 mg by mouth every morning., Disp: , Rfl:  .  metoprolol (TOPROL-XL) 100 MG 24 hr tablet, Take 50-100 mg by mouth as directed. TAKE 50 MG IN THE AM AND TAKE 100 MG IN THE PM, Disp: , Rfl:  .  sildenafil (VIAGRA) 50 MG tablet, TK 1 T PO ONCE A DAY 30 MIN TO 4 H PRIOR TO SEXUAL ACTIVITY PRN, Disp: , Rfl:  .   VIRTUSSIN A/C 100-10 MG/5ML syrup, TK 10 ML PO HS FOR 5 DAYS PRF COUGH, Disp: , Rfl:    No Known Allergies   Objective: Vascular Examination: Capillary refill time immediate x 10 digits.  Dorsalis pedis pulses present b/l.  Posterior tibial pulses present b/l.  No digital hair x 10 digits.  Skin temperature gradient WNL b/l.  Dermatological Examination: Skin with normal turgor, texture and tone b/l.  Toenails 1-5 b/l discolored, thick, dystrophic with subungual debris and pain with palpation to nailbeds due to thickness of nails.  Musculoskeletal: Muscle strength 5/5 to all LE muscle groups.  Neurological: Sensation intact with 10 gram monofilament.  Vibratory sensation intact.  Assessment: Painful onychomycosis toenails 1-5 b/l in patient on blood thinner.   Plan: 1. Toenails 1-5 b/l were debrided in length and girth without iatrogenic bleeding. 2. Patient to continue soft, supportive shoe gear daily. 3. Patient to report any pedal injuries to medical professional immediately. 4. Avoid self trimming due to use of blood thinner. 5. Follow up 10 weeks.  6. Patient/POA to call should there be a concern in the interim.

## 2019-03-03 ENCOUNTER — Encounter: Payer: Self-pay | Admitting: Podiatry

## 2019-03-03 ENCOUNTER — Ambulatory Visit: Payer: Medicare Other | Admitting: Podiatry

## 2019-03-03 ENCOUNTER — Other Ambulatory Visit: Payer: Self-pay

## 2019-03-03 DIAGNOSIS — M79674 Pain in right toe(s): Secondary | ICD-10-CM

## 2019-03-03 DIAGNOSIS — M79675 Pain in left toe(s): Secondary | ICD-10-CM | POA: Diagnosis not present

## 2019-03-03 DIAGNOSIS — B351 Tinea unguium: Secondary | ICD-10-CM

## 2019-03-09 ENCOUNTER — Other Ambulatory Visit: Payer: Self-pay | Admitting: Podiatry

## 2019-03-13 NOTE — Progress Notes (Signed)
Subjective: Jose Meyer is a 73 y.o. y.o. male who is on long term blood thinner Plavix and presents today with painful, discolored, thick toenails which interfere with daily activities. Pain is aggravated when wearing enclosed shoe gear. Pain is relieved with periodic professional debridement.  Darcus Austin, MD (Inactive) is his PCP.   Current Outpatient Medications:  .  amLODipine (NORVASC) 5 MG tablet, Take 5 mg by mouth every morning., Disp: , Rfl:  .  aspirin 81 MG tablet, Take 81 mg by mouth at bedtime. , Disp: , Rfl:  .  atorvastatin (LIPITOR) 10 MG tablet, Take 10 mg by mouth at bedtime. , Disp: , Rfl:  .  augmented betamethasone dipropionate (DIPROLENE-AF) 0.05 % ointment, APP EXT AA QD, Disp: , Rfl:  .  betamethasone dipropionate 0.05 % lotion, Apply 4.96 application topically as needed. For the scalp after shower, Disp: , Rfl: 0 .  clopidogrel (PLAVIX) 75 MG tablet, Take 75 mg by mouth daily. , Disp: , Rfl:  .  clotrimazole (LOTRIMIN) 1 % cream, APPLY TO BOTH FEET AND BETWEEN TOES TWICE DAILY, Disp: 30 g, Rfl: 1 .  ELIDEL 1 % cream, Apply 1 application topically as needed. For skin, Disp: , Rfl: 0 .  fluorometholone (FML) 0.1 % ophthalmic suspension, INT 1 GTT INTO OU UTD PRN, Disp: , Rfl: 4 .  fluticasone (FLONASE) 50 MCG/ACT nasal spray, SHAKE LQ AND U 1 SPR IEN QD, Disp: , Rfl:  .  hydrochlorothiazide (HYDRODIURIL) 25 MG tablet, Take 25 mg by mouth every morning. , Disp: , Rfl:  .  ipratropium (ATROVENT) 0.06 % nasal spray, U 2 SPRAYS IN EACH NOSTRIL TID, Disp: , Rfl:  .  losartan (COZAAR) 50 MG tablet, Take 50 mg by mouth every morning., Disp: , Rfl:  .  metoprolol (TOPROL-XL) 100 MG 24 hr tablet, Take 50-100 mg by mouth as directed. TAKE 50 MG IN THE AM AND TAKE 100 MG IN THE PM, Disp: , Rfl:  .  sildenafil (VIAGRA) 50 MG tablet, TK 1 T PO ONCE A DAY 30 MIN TO 4 H PRIOR TO SEXUAL ACTIVITY PRN, Disp: , Rfl:  .  VIRTUSSIN A/C 100-10 MG/5ML syrup, TK 10 ML PO HS FOR 5 DAYS  PRF COUGH, Disp: , Rfl:    No Known Allergies   Objective: There were no vitals filed for this visit.  Vascular Examination: Capillary refill time immediate x 10 digits.  Dorsalis pedis pulses present b/l.  Posterior tibial pulses present b/l.  No digital hair x 10 digits.  Skin temperature gradient WNL b/l.  Dermatological Examination: Skin with normal turgor, texture and tone b/l.  Toenails 1-5 b/l discolored, thick, dystrophic with subungual debris and pain with palpation to nailbeds due to thickness of nails.  Musculoskeletal: Muscle strength 5/5 to all LE muscle groups.  Neurological: Sensation intact with 10 gram monofilament.  Vibratory sensation intact.  Assessment: Painful onychomycosis toenails 1-5 b/l in patient on blood thinner.   Plan: 1. Toenails 1-5 b/l were debrided in length and girth without iatrogenic bleeding. 2. Patient to continue soft, supportive shoe gear daily. 3. Patient to report any pedal injuries to medical professional immediately. 4. Avoid self trimming due to use of blood thinner. 5. Follow up 10 weeks. 6. Patient/POA to call should there be a concern in the interim.

## 2019-05-12 ENCOUNTER — Ambulatory Visit (INDEPENDENT_AMBULATORY_CARE_PROVIDER_SITE_OTHER): Payer: Medicare Other | Admitting: Podiatry

## 2019-05-12 ENCOUNTER — Other Ambulatory Visit: Payer: Self-pay

## 2019-05-12 ENCOUNTER — Encounter: Payer: Self-pay | Admitting: Podiatry

## 2019-05-12 ENCOUNTER — Ambulatory Visit: Payer: Medicare Other | Admitting: Podiatry

## 2019-05-12 DIAGNOSIS — M79675 Pain in left toe(s): Secondary | ICD-10-CM | POA: Diagnosis not present

## 2019-05-12 DIAGNOSIS — Z9229 Personal history of other drug therapy: Secondary | ICD-10-CM

## 2019-05-12 DIAGNOSIS — M79674 Pain in right toe(s): Secondary | ICD-10-CM | POA: Diagnosis not present

## 2019-05-12 DIAGNOSIS — B351 Tinea unguium: Secondary | ICD-10-CM

## 2019-05-12 NOTE — Patient Instructions (Signed)

## 2019-05-13 NOTE — Progress Notes (Signed)
Subjective: Jose Meyer is a 73 y.o. y.o. male who is on long term blood thinner Plavix,  presents today with painful, discolored, thick toenails  which interfere with daily activities. Pain is aggravated when wearing enclosed shoe gear. Pain is relieved with periodic professional debridement.  He continues to use topical antifungal from [place].  Patient's PCP is London Pepper, MD and last date seen was   Current Outpatient Medications:  .  NON FORMULARY, Antifungal toenail solution from [facility], faxed 05/12/2019 HS-CMA, Disp: , Rfl:  .  amLODipine (NORVASC) 5 MG tablet, Take 5 mg by mouth every morning., Disp: , Rfl:  .  aspirin 81 MG tablet, Take 81 mg by mouth at bedtime. , Disp: , Rfl:  .  atorvastatin (LIPITOR) 10 MG tablet, Take 10 mg by mouth at bedtime. , Disp: , Rfl:  .  augmented betamethasone dipropionate (DIPROLENE-AF) 0.05 % ointment, APP EXT AA QD, Disp: , Rfl:  .  betamethasone dipropionate 0.05 % lotion, Apply 9.56 application topically as needed. For the scalp after shower, Disp: , Rfl: 0 .  clopidogrel (PLAVIX) 75 MG tablet, Take 75 mg by mouth daily. , Disp: , Rfl:  .  clotrimazole (LOTRIMIN) 1 % cream, APPLY TO BOTH FEET AND BETWEEN TOES TWICE DAILY, Disp: 30 g, Rfl: 1 .  diclofenac sodium (VOLTAREN) 1 % GEL, APP 2 TO 4 GRAMS EXT AA QID PRF PAIN, Disp: , Rfl:  .  ELIDEL 1 % cream, Apply 1 application topically as needed. For skin, Disp: , Rfl: 0 .  fluorometholone (FML) 0.1 % ophthalmic suspension, INT 1 GTT INTO OU UTD PRN, Disp: , Rfl: 4 .  fluorouracil (EFUDEX) 5 % cream, APP EXT AA QD FOR 21 DAYS, Disp: , Rfl:  .  fluticasone (FLONASE) 50 MCG/ACT nasal spray, SHAKE LQ AND U 1 SPR IEN QD, Disp: , Rfl:  .  hydrochlorothiazide (HYDRODIURIL) 25 MG tablet, Take 25 mg by mouth every morning. , Disp: , Rfl:  .  ipratropium (ATROVENT) 0.06 % nasal spray, U 2 SPRAYS IN EACH NOSTRIL TID, Disp: , Rfl:  .  losartan (COZAAR) 50 MG tablet, Take 50 mg  by mouth every morning., Disp: , Rfl:  .  metoprolol (TOPROL-XL) 100 MG 24 hr tablet, Take 50-100 mg by mouth as directed. TAKE 50 MG IN THE AM AND TAKE 100 MG IN THE PM, Disp: , Rfl:  .  sildenafil (VIAGRA) 50 MG tablet, TK 1 T PO ONCE A DAY 30 MIN TO 4 H PRIOR TO SEXUAL ACTIVITY PRN, Disp: , Rfl:  .  triamcinolone ointment (KENALOG) 0.5 %, APP EXT AA BID FOR 5 DAYS, Disp: , Rfl:  .  VIRTUSSIN A/C 100-10 MG/5ML syrup, TK 10 ML PO HS FOR 5 DAYS PRF COUGH, Disp: , Rfl:    No Known Allergies   Objective: Vascular Examination: Capillary refill time immediate x 10 digits.  Dorsalis pedis pulses palpable b/l.  Posterior tibial pulses palpable b/l.  No digital hair x 10 digits.  Skin temperature gradient WNL b/l.  Dermatological Examination: Skin with normal turgor, texture and tone b/l.  Toenails 1-5 b/l discolored, thick, dystrophic with subungual debris and pain with palpation to nailbeds due to thickness of nails.  Musculoskeletal: Muscle strength 5/5 to all LE muscle groups.  Neurological: Sensation intact 5/5 b/l with 10 gram monofilament.  Vibratory sensation intact.  Assessment: Painful onychomycosis toenails 1-5 b/l in patient on blood thinner.   Plan: 1. Toenails 1-5 b/l were debrided in length  and girth without iatrogenic bleeding. 2. Patient to continue soft, supportive shoe gear daily. 3. Patient to report any pedal injuries to medical professional immediately. 4. Avoid self trimming due to use of blood thinner. 5. Follow up 10 weeks.  6. Patient/POA to call should there be a concern in the interim.

## 2019-07-01 ENCOUNTER — Encounter (HOSPITAL_COMMUNITY): Payer: Self-pay | Admitting: Cardiology

## 2019-07-01 ENCOUNTER — Other Ambulatory Visit: Payer: Self-pay

## 2019-07-01 ENCOUNTER — Ambulatory Visit (HOSPITAL_COMMUNITY)
Admission: RE | Admit: 2019-07-01 | Discharge: 2019-07-01 | Disposition: A | Discharge status: Home / Self Care | Payer: Medicare Other | Source: Ambulatory Visit | Attending: Cardiology | Admitting: Cardiology

## 2019-07-01 VITALS — BP 122/86 | HR 80 | Wt 247.4 lb

## 2019-07-01 DIAGNOSIS — R0602 Shortness of breath: Secondary | ICD-10-CM | POA: Diagnosis not present

## 2019-07-01 DIAGNOSIS — Z8673 Personal history of transient ischemic attack (TIA), and cerebral infarction without residual deficits: Secondary | ICD-10-CM | POA: Insufficient documentation

## 2019-07-01 DIAGNOSIS — Z7982 Long term (current) use of aspirin: Secondary | ICD-10-CM | POA: Diagnosis not present

## 2019-07-01 DIAGNOSIS — Z79899 Other long term (current) drug therapy: Secondary | ICD-10-CM | POA: Insufficient documentation

## 2019-07-01 DIAGNOSIS — Z7902 Long term (current) use of antithrombotics/antiplatelets: Secondary | ICD-10-CM | POA: Diagnosis not present

## 2019-07-01 DIAGNOSIS — I10 Essential (primary) hypertension: Secondary | ICD-10-CM | POA: Diagnosis present

## 2019-07-01 DIAGNOSIS — E785 Hyperlipidemia, unspecified: Secondary | ICD-10-CM | POA: Diagnosis not present

## 2019-07-01 DIAGNOSIS — I2510 Atherosclerotic heart disease of native coronary artery without angina pectoris: Secondary | ICD-10-CM | POA: Diagnosis not present

## 2019-07-01 LAB — COMPREHENSIVE METABOLIC PANEL
ALT: 32 U/L (ref 0–44)
AST: 28 U/L (ref 15–41)
Albumin: 3.7 g/dL (ref 3.5–5.0)
Alkaline Phosphatase: 52 U/L (ref 38–126)
Anion gap: 12 (ref 5–15)
BUN: 16 mg/dL (ref 8–23)
CO2: 27 mmol/L (ref 22–32)
Calcium: 9.4 mg/dL (ref 8.9–10.3)
Chloride: 101 mmol/L (ref 98–111)
Creatinine, Ser: 0.93 mg/dL (ref 0.61–1.24)
GFR calc Af Amer: >60 mL/min (ref >60)
GFR calc non Af Amer: >60 mL/min (ref >60)
Glucose, Bld: 100 mg/dL — ABNORMAL HIGH (ref 70–99)
Potassium: 3.7 mmol/L (ref 3.5–5.1)
Sodium: 140 mmol/L (ref 135–145)
Total Bilirubin: 0.9 mg/dL (ref 0.3–1.2)
Total Protein: 6.9 g/dL (ref 6.5–8.1)

## 2019-07-01 LAB — LIPID PANEL
Cholesterol: 133 mg/dL (ref 0–200)
HDL: 50 mg/dL (ref >40)
LDL Cholesterol: 53 mg/dL (ref 0–99)
Total CHOL/HDL Ratio: 2.7 RATIO
Triglycerides: 150 mg/dL — ABNORMAL HIGH (ref <150)
VLDL: 30 mg/dL (ref 0–40)

## 2019-07-01 LAB — CBC
HCT: 49.7 % (ref 39.0–52.0)
Hemoglobin: 16.7 g/dL (ref 13.0–17.0)
MCH: 32.6 pg (ref 26.0–34.0)
MCHC: 33.6 g/dL (ref 30.0–36.0)
MCV: 97.1 fL (ref 80.0–100.0)
Platelets: 192 10*3/uL (ref 150–400)
RBC: 5.12 MIL/uL (ref 4.22–5.81)
RDW: 13.4 % (ref 11.5–15.5)
WBC: 7.1 10*3/uL (ref 4.0–10.5)
nRBC: 0 % (ref 0.0–0.2)

## 2019-07-01 NOTE — Progress Notes (Signed)
Patient ID: Jose Meyer, male   DOB: November 11, 1945, 73 y.o.   MRN: [id number] PCP: Dr. Darcus Austin Cardiology: Dr. Aundra Dubin  73 y.o.with history of prior cerebellar CVA, nonobstructive CAD, HTN, hyperlipidemia presents for followup of CAD, HTN, CVA.  Patient has a history of cerebellar CVA in 2000, documented by MRI.  Symptoms at that time were difficulty with balace and discoordination.  No vertigo at that time.  In 7/10, pt had just gotten dressed for church one Sunday and was not doing anything in particular.  He developed a severe spinning sensation like vertigo that lasted for about 5 minutes.  He was "woozy" for about 1.5 hours after that before finally returning to normal.  He does not remember having the vertigo set off by moving his head in a particular direction.  He went to the ER and had an MRI of his head done, showing only small vessel disease.  There was some concern that this could have been a posterior circulation TIA given prior history of cerebellar CVA.  He did have a TEE in 9/10 showing no PFO, normal LV and RV systolic function, grade III plaque in the aortic arch/descending thoracic aorta.  Patient has known nonobstructive CAD from cath in 2007.  Last echo in 9/13 showed normal EF. Cardiolite was done in 2/18, no ischemia (low risk study).    Patient has been stable over the last year.  BP has been well-controlled.  He has mild dyspnea with heavier exertion like pulling his garbage can up a hill.  He can, however, walk for 1.5 miles without dyspnea.  No chest pain or discomfort.  No palpitations.  No stroke-like symptoms.  Weight up 5 lbs.   Labs (8/10): creatinine 1.0, LDL 73 Labs (9/12): K 4.1, creatinine 0.83, LDL 53, HDL 42, TGs 131 Labs (9/13): K 4.1, creatinine 0.9, TSH normal Labs (12/15): LDL 62, HDL 51, LFTs normal, K 4, creatinine 0.98 Labs (12/17): K 3.6, creatinine 0.96, hgb 17.1, LDL 53, HDL 67 Labs (1/19): LDL 54, HDL 50 Labs (7/19): K 4.2, creatinine 0.88 Labs  (9/19): LDL 59, HDL 47  ECG (personally reviewed): NSR, normal  Allergies (verified):  No Known Drug Allergies  Past Medical History: 1. Hyperlipidemia 2. Cerebellar CVA 2000: documented by MRI.  Possible TIA 7/10 with vertigo.  MRI 7/10 showed only small vessel disease.  Possible TIA 7/10.  Carotid dopplers (9/10) with no significant disease.  TEE (9/10): Normal LV and RV size and systolic function.  No PFO.  No significant valvular disease.  At least grade III plaque in the arch and descending thoracic aorta with no ulceration or mobility.  3. HTN 4. History of colon CA s/p R hemicolectomy in 9/09.  5. psoriasis 6. diverticulosis 7. CAD: nonobstructive.  LHC in 2000 without significant disease.  ETT-cardiolite 2007 with fixed inferior defect.  LHC (4/07) showed EF 60%, 30-40% CFX stenosis, 30-40% mRCA stenosis.  Probably false positive cardiolite.  Echo (9/13): EF 55-60%, mild LVH, grade I diastolic dysfunction, mild to moderate LAE. - Cardiolite (2/18): Medium-sized, fixed inferolateral perfusion defect (likely artifact given normal wall motion), no ischemia, EF 64%.  8. Palpitations: PVCs.  30 day event monitor in 9/13 with occasional PVCs but no more significant arrhythmias.   9. Chronic low back pain.  10. Plantar fasciitis  Family History: No premature CAD.   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 Lives in [place].  No smoking since 1975.  Occasional ETOH.  Married with 2 children [facility], cardiologist in [place]).  Enjoys woodworking.  ROS: All systems reviewed and negative except as per HPI.   Current Outpatient Medications  Medication Sig Dispense Refill  . amLODipine (NORVASC) 5 MG tablet Take 5 mg by mouth every morning.    Marland Kitchen aspirin 81 MG tablet Take 81 mg by mouth at bedtime.     Marland Kitchen atorvastatin (LIPITOR) 10 MG tablet Take 10 mg by mouth at bedtime.     . betamethasone dipropionate 0.05 % lotion Apply  [id number] application topically as needed. For the scalp after shower  0  . clopidogrel (PLAVIX) 75 MG tablet Take 75 mg by mouth daily.     . clotrimazole (LOTRIMIN) 1 % cream APPLY TO BOTH FEET AND BETWEEN TOES TWICE DAILY 30 g 1  . ELIDEL 1 % cream Apply 1 application topically as needed. For skin  0  . fluorometholone (FML) 0.1 % ophthalmic suspension INT 1 GTT INTO OU UTD PRN  4  . hydrochlorothiazide (HYDRODIURIL) 25 MG tablet Take 25 mg by mouth every morning.     Marland Kitchen losartan (COZAAR) 50 MG tablet Take 50 mg by mouth every morning.    . metoprolol (TOPROL-XL) 100 MG 24 hr tablet Take 50-100 mg by mouth as directed. TAKE 50 MG IN THE AM AND TAKE 100 MG IN THE PM    . NON FORMULARY Antifungal toenail solution from [facility], faxed 05/12/2019 HS-CMA    . sildenafil (VIAGRA) 50 MG tablet TK 1 T PO ONCE A DAY 30 MIN TO 4 H PRIOR TO SEXUAL ACTIVITY PRN    . triamcinolone ointment (KENALOG) 0.5 % APP EXT AA BID FOR 5 DAYS     No current facility-administered medications for this encounter.     BP 122/86   Pulse 80   Wt 112.2 kg (247 lb 6.4 oz)   SpO2 95%   BMI 33.55 kg/m  General: NAD Neck: No JVD, no thyromegaly or thyroid nodule.  Lungs: Clear to auscultation bilaterally with normal respiratory effort. CV: Nondisplaced PMI.  Heart regular S1/S2, no S3/S4, no murmur.  1+ ankle edema.  No carotid bruit.  Normal pedal pulses.  Abdomen: Soft, nontender, no hepatosplenomegaly, no distention.  Skin: Intact without lesions or rashes.  Neurologic: Alert and oriented x 3.  Psych: Normal affect. Extremities: No clubbing or cyanosis.  HEENT: Normal.   Assessment/Plan:  1. CAD: Nonobstructive CAD on last cath in 2007. Cardiolite in 2/18 with no ischemia.  No further chest pain episodes. Generally goood exercise tolerance; he is mildly short of breath with heavy exertion, which I suspect is due to deconditioning as he has not been as active over the last 6-8 months with coronavirus  pandemic.   - Increase exercise. If exertional dyspnea worsens, I will arrange for Cardiolite and echo.  - Continue ASA, Plavix, ARB, Toprol XL, and statin.   2. HYPERLIPIDEMIA: Check lipids today, goal LDL < 70.  3. HYPERTENSION: BP has been well-controlled, continue current meds.  4. CVA: H/o CVA/TIA in the past. He will continue on Plavix. No recent neurological symptoms.   Followup 1 year    Loralie Champagne 07/01/2019

## 2019-07-01 NOTE — Patient Instructions (Addendum)
Labs done today. We will contact you only if they are abnormal.  No medication changes were made today. Please continue all medications as prescribed.  Your physician recommends that you schedule a follow-up appointment in: 1 year. We will contact you to schedule this appointment at a later date.  At the [facility], you and your health needs are our priority. As part of our continuing mission to provide you with exceptional heart care, we have created designated Provider Care Teams. These Care Teams include your primary Cardiologist (physician) and Advanced Practice Providers (APPs- Physician Assistants and Nurse Practitioners) who all work together to provide you with the care you need, when you need it.   You may see any of the following providers on your designated Care Team at your next follow up: Marland Kitchen Dr Glori Bickers . Dr Loralie Champagne . Darrick Grinder, NP   Please be sure to bring in all your medications bottles to every appointment.

## 2019-07-23 ENCOUNTER — Encounter: Payer: Self-pay | Admitting: Podiatry

## 2019-07-23 ENCOUNTER — Ambulatory Visit: Payer: Medicare Other | Admitting: Podiatry

## 2019-07-23 ENCOUNTER — Other Ambulatory Visit: Payer: Self-pay

## 2019-07-23 DIAGNOSIS — M79674 Pain in right toe(s): Secondary | ICD-10-CM | POA: Diagnosis not present

## 2019-07-23 DIAGNOSIS — B351 Tinea unguium: Secondary | ICD-10-CM | POA: Diagnosis not present

## 2019-07-23 DIAGNOSIS — M79675 Pain in left toe(s): Secondary | ICD-10-CM | POA: Diagnosis not present

## 2019-07-23 NOTE — Patient Instructions (Signed)

## 2019-07-26 NOTE — Progress Notes (Signed)
Subjective: Jose Meyer is seen today for follow up painful, elongated, thickened toenails b/l feet that he cannot cut. Pain interferes with daily activities. Aggravating factor includes wearing enclosed shoe gear and relieved with periodic debridement.  He states he has been using the topical antifungal from [place]. He states one day his right great toenail just came off. He denies any drainage, redness, swelling or pain.   Current Outpatient Medications on File Prior to Visit  Medication Sig  . amLODipine (NORVASC) 5 MG tablet Take 5 mg by mouth every morning.  Marland Kitchen aspirin 81 MG tablet Take 81 mg by mouth at bedtime.   Marland Kitchen atorvastatin (LIPITOR) 10 MG tablet Take 10 mg by mouth at bedtime.   . betamethasone dipropionate 0.05 % lotion Apply [id number] application topically as needed. For the scalp after shower  . clopidogrel (PLAVIX) 75 MG tablet Take 75 mg by mouth daily.   . clotrimazole (LOTRIMIN) 1 % cream APPLY TO BOTH FEET AND BETWEEN TOES TWICE DAILY  . ELIDEL 1 % cream Apply 1 application topically as needed. For skin  . fluorometholone (FML) 0.1 % ophthalmic suspension INT 1 GTT INTO OU UTD PRN  . hydrochlorothiazide (HYDRODIURIL) 25 MG tablet Take 25 mg by mouth every morning.   Marland Kitchen losartan (COZAAR) 50 MG tablet Take 50 mg by mouth every morning.  . methocarbamol (ROBAXIN) 500 MG tablet TK 1 T PO Q 6 H PRF BACK SPASMS  . metoprolol (TOPROL-XL) 100 MG 24 hr tablet Take 50-100 mg by mouth as directed. TAKE 50 MG IN THE AM AND TAKE 100 MG IN THE PM  . NON FORMULARY Antifungal toenail solution from [facility]: [facility], faxed 05/12/2019 HS-CMA  . sildenafil (VIAGRA) 50 MG tablet TK 1 T PO ONCE A DAY 30 MIN TO 4 H PRIOR TO SEXUAL ACTIVITY PRN  . triamcinolone ointment (KENALOG) 0.1 % APP EXT AA BID FOR 14 DAYS  . triamcinolone ointment (KENALOG) 0.5 % APP EXT AA BID FOR 5 DAYS   No current facility-administered medications on file prior to visit.      No Known Allergies    Objective:  Vascular Examination: Capillary refill time immediate x 10 digits.  Dorsalis pedis present b/l.  Posterior tibial pulses present b/l.  Digital hair absent b/l.  Skin temperature gradient WNL b/l.   Dermatological Examination: Skin with normal turgor, texture and tone b/l.  Toenails 1-5 left, 2-5 right discolored, thick, dystrophic with subungual debris and pain with palpation to nailbeds due to thickness of nails.  Anonychia right hallux with evidence of permanent total nail avulsion. Nailbed completely epithelialized and intact.  Musculoskeletal: Muscle strength 5/5 to all LE muscle groups  No pain, crepitus or joint limitation noted with ROM.   Neurological Examination: Protective sensation intact 5/5 with 10 gram monofilament bilaterally.  Epicritic sensation present bilaterally.  Vibratory sensation intact bilaterally.   Assessment: Painful onychomycosis toenails 1-5 left, 2-5 right  Plan: 1. Toenails 1-5 left, 2-5 right were debrided in length and girth without iatrogenic bleeding. 2. Continue topical antifungal medication to toenails b/l. Do not apply to right great toe nail bed.  3. Patient to continue soft, supportive shoe gear. 4. Patient to report any pedal injuries to medical professional immediately. 5. Follow up 9-10 weeks. 6. Patient/POA to call should there be a concern in the interim.

## 2019-10-27 ENCOUNTER — Ambulatory Visit: Payer: Medicare Other | Admitting: Podiatry

## 2019-11-01 ENCOUNTER — Other Ambulatory Visit: Payer: Self-pay

## 2019-11-01 ENCOUNTER — Ambulatory Visit (INDEPENDENT_AMBULATORY_CARE_PROVIDER_SITE_OTHER): Payer: Medicare PPO | Admitting: Podiatry

## 2019-11-01 ENCOUNTER — Encounter: Payer: Self-pay | Admitting: Podiatry

## 2019-11-01 DIAGNOSIS — M79674 Pain in right toe(s): Secondary | ICD-10-CM

## 2019-11-01 DIAGNOSIS — B351 Tinea unguium: Secondary | ICD-10-CM | POA: Diagnosis not present

## 2019-11-01 DIAGNOSIS — M79675 Pain in left toe(s): Secondary | ICD-10-CM

## 2019-11-01 NOTE — Patient Instructions (Signed)

## 2019-11-02 ENCOUNTER — Ambulatory Visit: Payer: Medicare PPO | Attending: Internal Medicine

## 2019-11-02 DIAGNOSIS — Z23 Encounter for immunization: Secondary | ICD-10-CM

## 2019-11-02 NOTE — Progress Notes (Signed)
   Covid-19 Vaccination Clinic  Name:  Jose Meyer    MRN: [id number] DOB: 1946/04/20  11/02/2019  Mr. Jose Meyer was observed post Covid-19 immunization for 15 minutes without incidence. He was provided with Vaccine Information Sheet and instruction to access the V-Safe system.   Mr. Jose Meyer was instructed to call 911 with any severe reactions post vaccine: Marland Kitchen Difficulty breathing  . Swelling of your face and throat  . A fast heartbeat  . A bad rash all over your body  . Dizziness and weakness    Immunizations Administered    Name Date Dose VIS Date Route   Pfizer COVID-19 Vaccine 11/02/2019 11:08 AM 0.3 mL 09/24/2019 Intramuscular   Manufacturer: [place]   Lot: [id number]   [place]: [id number]

## 2019-11-06 NOTE — Progress Notes (Signed)
Subjective: Jose Meyer presents today for follow up of painful mycotic nails b/l that are difficult to trim. Pain interferes with ambulation. Aggravating factors include wearing enclosed shoe gear. Pain is relieved with periodic professional debridement.   No Known Allergies   Objective: There were no vitals filed for this visit.  Vascular Examination:  capillary refill time to digits immediate b/l, palpable DP pulses b/l, palpable PT pulses b/l, pedal hair absent b/l and skin temperature gradient within normal limits b/l  Dermatological Examination: Pedal skin with normal turgor, texture and tone bilaterally., No open wounds bilaterally. and No interdigital macerations bilaterally.  Toenails 1-5 left, 2-5 right exhibit elongation, dystrophy, discoloration, thickening, subungual debris, pain to palpation of nail beds and subungual hematoma.  Musculoskeletal: normal muscle strength 5/5 to all lower extremity muscle groups bilaterally, no gross bony deformities bilaterally and no pain crepitus or joint limitation noted with ROM  Neurological: sensation intact 5/5 intact bilaterally with 10g monofilament, vibratory sensation intact and proprioception intact bilaterally  Assessment: 1. Pain due to onychomycosis of toenails of both feet      Plan: -Toenails 1-5 b/l were debrided in length and girth without iatrogenic bleeding. -Patient to continue soft, supportive shoe gear daily. -Patient to report any pedal injuries to medical professional immediately. -Patient/POA to call should there be question/concern in the interim.  Return in about 3 months (around 01/30/2020).

## 2019-11-22 ENCOUNTER — Ambulatory Visit: Payer: Medicare PPO | Attending: Internal Medicine

## 2019-11-22 DIAGNOSIS — Z23 Encounter for immunization: Secondary | ICD-10-CM | POA: Insufficient documentation

## 2019-11-22 NOTE — Progress Notes (Signed)
   Covid-19 Vaccination Clinic  Name:  Jose Meyer    MRN: [id number] DOB: 03-11-1946  11/22/2019  Mr. Flaum was observed post Covid-19 immunization for 15 minutes without incidence. He was provided with Vaccine Information Sheet and instruction to access the V-Safe system.   Mr. Reda was instructed to call 911 with any severe reactions post vaccine: Marland Kitchen Difficulty breathing  . Swelling of your face and throat  . A fast heartbeat  . A bad rash all over your body  . Dizziness and weakness    Immunizations Administered    Name Date Dose VIS Date Route   Pfizer COVID-19 Vaccine 11/22/2019  9:19 AM 0.3 mL 09/24/2019 Intramuscular   Manufacturer: [place]   Lot: [id number]   [place]: [id number]

## 2020-01-10 ENCOUNTER — Other Ambulatory Visit: Payer: Self-pay

## 2020-01-10 ENCOUNTER — Encounter: Payer: Self-pay | Admitting: Podiatry

## 2020-01-10 ENCOUNTER — Ambulatory Visit: Payer: Medicare PPO | Admitting: Podiatry

## 2020-01-10 VITALS — Temp 97.3°F

## 2020-01-10 DIAGNOSIS — M79675 Pain in left toe(s): Secondary | ICD-10-CM

## 2020-01-10 DIAGNOSIS — M79674 Pain in right toe(s): Secondary | ICD-10-CM

## 2020-01-10 DIAGNOSIS — B351 Tinea unguium: Secondary | ICD-10-CM

## 2020-01-10 NOTE — Patient Instructions (Signed)

## 2020-01-12 NOTE — Progress Notes (Signed)
Subjective: Jose Meyer presents today for follow up of painful mycotic nails b/l that are difficult to trim. Pain interferes with ambulation. Aggravating factors include wearing enclosed shoe gear. Pain is relieved with periodic professional debridement.   No Known Allergies   Objective: Vitals:   01/10/20 1103  Temp: (!) 97.3 F (36.3 C)    Pt 74 y.o. year old male  in NAD. AAO x 3.   Vascular Examination:  Capillary refill time to digits immediate b/l. Palpable DP pulses b/l. Palpable PT pulses b/l. Pedal hair absent b/l Skin temperature gradient within normal limits b/l.  Dermatological Examination: Pedal skin with normal turgor, texture and tone bilaterally. No open wounds bilaterally. No interdigital macerations bilaterally. Toenails 1-5 b/l elongated, dystrophic, thickened, crumbly with subungual debris and tenderness to dorsal palpation.  Musculoskeletal: Normal muscle strength 5/5 to all lower extremity muscle groups bilaterally, no gross bony deformities bilaterally and no pain crepitus or joint limitation noted with ROM b/l.  Neurological: Protective sensation intact 5/5 intact bilaterally with 10g monofilament b/l Vibratory sensation intact b/l.  Assessment: 1. Pain due to onychomycosis of toenails of both feet    Plan: -Toenails 1-5 b/l were debrided in length and girth with sterile nail nippers and dremel without iatrogenic bleeding.  -Patient to continue soft, supportive shoe gear daily. -Patient to report any pedal injuries to medical professional immediately. -Patient/POA to call should there be question/concern in the interim.  Return in about 9 weeks (around 03/13/2020).

## 2020-03-27 ENCOUNTER — Other Ambulatory Visit: Payer: Self-pay

## 2020-03-27 ENCOUNTER — Encounter: Payer: Self-pay | Admitting: Podiatry

## 2020-03-27 ENCOUNTER — Ambulatory Visit: Payer: Medicare PPO | Admitting: Podiatry

## 2020-03-27 DIAGNOSIS — M79675 Pain in left toe(s): Secondary | ICD-10-CM

## 2020-03-27 DIAGNOSIS — B351 Tinea unguium: Secondary | ICD-10-CM | POA: Diagnosis not present

## 2020-03-27 DIAGNOSIS — M79674 Pain in right toe(s): Secondary | ICD-10-CM

## 2020-03-27 NOTE — Progress Notes (Signed)
Subjective: Jose Meyer is a pleasant 74 y.o. male patient seen today painful mycotic nails b/l that are difficult to trim. Pain interferes with ambulation. Aggravating factors include wearing enclosed shoe gear. Pain is relieved with periodic professional debridement.  Currently using topical antifungal solution from [place] for his mycotic toenails.   Past Medical History:  Diagnosis Date  . Colon cancer [facility])    s/p R hemicolectomy 06/2008  . Coronary artery disease    Non-Obstructive;  LHC 2000 no sig dz; ETT-Cardiolite 2007 with fixed Inf defect;  LHC 4/07:  EF 60%, CFX 30-40%, mRCA 30-40% (prob false + CLite)  . CVA (cerebral infarction)    cerebellar CVA 2000: doc by MRI; Poss TIA 7/10 with vertigo; MRI 7/10 => only small vessel disease; Carotids 9/10: no sig disease;  TEE 9/10: normal LV and RV size and systolic fxn, no PFO, no sig valvular dz, at least Gr III plaque in arch and desc thoracic aorta (no ulceration or mobility)  . Diverticulosis   . H/O echocardiogram    Echo 9/13: EF 55-60%, mod LVH, grade 1 diast dysfn, mild to mod LAE  . Hyperlipidemia   . Hypertension   . Psoriasis     Patient Active Problem List   Diagnosis Date Noted  . Bilateral sensorineural hearing loss 07/01/2017  . Palpitations 07/19/2012  . CVA (cerebral infarction) 07/02/2011  . HYPERLIPIDEMIA-MIXED 06/26/2009  . Essential hypertension 06/26/2009  . CAD, NATIVE VESSEL 06/26/2009  . CVA 06/26/2009  . Transient cerebral ischemia 06/26/2009    Current Outpatient Medications on File Prior to Visit  Medication Sig Dispense Refill  . amLODipine (NORVASC) 5 MG tablet Take 5 mg by mouth every morning.    Marland Kitchen aspirin 81 MG tablet Take 81 mg by mouth at bedtime.     Marland Kitchen atorvastatin (LIPITOR) 10 MG tablet Take 10 mg by mouth at bedtime.     . betamethasone dipropionate 0.05 % lotion Apply 2.11 application topically as needed. For the scalp after shower  0  . clopidogrel (PLAVIX) 75 MG tablet  Take 75 mg by mouth daily.     . clotrimazole (LOTRIMIN) 1 % cream APPLY TO BOTH FEET AND BETWEEN TOES TWICE DAILY 30 g 1  . ELIDEL 1 % cream Apply 1 application topically as needed. For skin  0  . fluorometholone (FML) 0.1 % ophthalmic suspension INT 1 GTT INTO OU UTD PRN  4  . hydrochlorothiazide (HYDRODIURIL) 25 MG tablet Take 25 mg by mouth every morning.     Marland Kitchen losartan (COZAAR) 50 MG tablet Take 50 mg by mouth every morning.    . methocarbamol (ROBAXIN) 500 MG tablet TK 1 T PO Q 6 H PRF BACK SPASMS    . metoprolol (TOPROL-XL) 100 MG 24 hr tablet Take 50-100 mg by mouth as directed. TAKE 50 MG IN THE AM AND TAKE 100 MG IN THE PM    . NON FORMULARY Antifungal toenail solution from [facility], faxed 05/12/2019 HS-CMA    . sildenafil (VIAGRA) 50 MG tablet TK 1 T PO ONCE A DAY 30 MIN TO 4 H PRIOR TO SEXUAL ACTIVITY PRN    . SYSTANE ULTRA 0.4-0.3 % SOLN SMARTSIG:1 Drop(s) In Eye(s) As Needed    . triamcinolone ointment (KENALOG) 0.1 % APP EXT AA BID FOR 14 DAYS    . triamcinolone ointment (KENALOG) 0.5 % APP EXT AA BID FOR 5 DAYS     No current facility-administered medications on file prior to visit.  No Known Allergies  Objective: Physical Exam  General: Jose Meyer is a pleasant 74 y.o.  Caucasian  male, in NAD. AAO x 3.   Vascular:  Neurovascular status unchanged b/l lower extremities. Capillary refill time to digits immediate b/l. Palpable DP pulses b/l. Palpable PT pulses b/l. Pedal hair absent b/l. Skin temperature gradient within normal limits b/l.  Dermatological:  Pedal skin with normal turgor, texture and tone bilaterally. No open wounds bilaterally. No interdigital macerations bilaterally. Toenails 1-5 b/l elongated, discolored, dystrophic, thickened, crumbly with subungual debris and tenderness to dorsal palpation.  Musculoskeletal:  Normal muscle strength 5/5 to all lower extremity muscle groups bilaterally. No pain crepitus or joint limitation noted with  ROM b/l. No gross bony deformities bilaterally.  Neurological:  Protective sensation intact 5/5 intact bilaterally with 10g monofilament b/l. Vibratory sensation intact b/l. Proprioception intact bilaterally.  Assessment and Plan:  1. Pain due to onychomycosis of toenails of both feet    -Examined patient. -No new findings. No new orders. -Toenails 1-5 b/l were debrided in length and girth with sterile nail nippers and dremel without iatrogenic bleeding. Continue Nonformulary topical compunded antifungal solution from [place]. -Patient to report any pedal injuries to medical professional immediately. -Patient to continue soft, supportive shoe gear daily. -Patient/POA to call should there be question/concern in the interim.  Return in about 9 weeks (around 05/29/2020) for nail trim/ Plavix.  Marzetta Board, DPM

## 2020-04-07 DIAGNOSIS — Z86018 Personal history of other benign neoplasm: Secondary | ICD-10-CM | POA: Diagnosis not present

## 2020-04-07 DIAGNOSIS — C44712 Basal cell carcinoma of skin of right lower limb, including hip: Secondary | ICD-10-CM | POA: Diagnosis not present

## 2020-04-07 DIAGNOSIS — L570 Actinic keratosis: Secondary | ICD-10-CM | POA: Diagnosis not present

## 2020-04-07 DIAGNOSIS — D225 Melanocytic nevi of trunk: Secondary | ICD-10-CM | POA: Diagnosis not present

## 2020-04-07 DIAGNOSIS — D0472 Carcinoma in situ of skin of left lower limb, including hip: Secondary | ICD-10-CM | POA: Diagnosis not present

## 2020-04-07 DIAGNOSIS — L578 Other skin changes due to chronic exposure to nonionizing radiation: Secondary | ICD-10-CM | POA: Diagnosis not present

## 2020-04-07 DIAGNOSIS — D2271 Melanocytic nevi of right lower limb, including hip: Secondary | ICD-10-CM | POA: Diagnosis not present

## 2020-04-07 DIAGNOSIS — D0439 Carcinoma in situ of skin of other parts of face: Secondary | ICD-10-CM | POA: Diagnosis not present

## 2020-04-07 DIAGNOSIS — D2272 Melanocytic nevi of left lower limb, including hip: Secondary | ICD-10-CM | POA: Diagnosis not present

## 2020-04-07 DIAGNOSIS — Q828 Other specified congenital malformations of skin: Secondary | ICD-10-CM | POA: Diagnosis not present

## 2020-04-07 DIAGNOSIS — D485 Neoplasm of uncertain behavior of skin: Secondary | ICD-10-CM | POA: Diagnosis not present

## 2020-04-07 DIAGNOSIS — Z85828 Personal history of other malignant neoplasm of skin: Secondary | ICD-10-CM | POA: Diagnosis not present

## 2020-04-19 DIAGNOSIS — D0439 Carcinoma in situ of skin of other parts of face: Secondary | ICD-10-CM | POA: Diagnosis not present

## 2020-04-27 DIAGNOSIS — L570 Actinic keratosis: Secondary | ICD-10-CM | POA: Diagnosis not present

## 2020-04-27 DIAGNOSIS — L219 Seborrheic dermatitis, unspecified: Secondary | ICD-10-CM | POA: Diagnosis not present

## 2020-04-27 DIAGNOSIS — D485 Neoplasm of uncertain behavior of skin: Secondary | ICD-10-CM | POA: Diagnosis not present

## 2020-05-15 DIAGNOSIS — M7042 Prepatellar bursitis, left knee: Secondary | ICD-10-CM | POA: Diagnosis not present

## 2020-05-15 DIAGNOSIS — M7041 Prepatellar bursitis, right knee: Secondary | ICD-10-CM | POA: Diagnosis not present

## 2020-05-17 DIAGNOSIS — C44712 Basal cell carcinoma of skin of right lower limb, including hip: Secondary | ICD-10-CM | POA: Diagnosis not present

## 2020-05-18 DIAGNOSIS — I10 Essential (primary) hypertension: Secondary | ICD-10-CM | POA: Diagnosis not present

## 2020-05-18 DIAGNOSIS — E7800 Pure hypercholesterolemia, unspecified: Secondary | ICD-10-CM | POA: Diagnosis not present

## 2020-05-18 DIAGNOSIS — R7303 Prediabetes: Secondary | ICD-10-CM | POA: Diagnosis not present

## 2020-06-02 DIAGNOSIS — M7041 Prepatellar bursitis, right knee: Secondary | ICD-10-CM | POA: Diagnosis not present

## 2020-06-23 DIAGNOSIS — H2513 Age-related nuclear cataract, bilateral: Secondary | ICD-10-CM | POA: Diagnosis not present

## 2020-06-23 DIAGNOSIS — H524 Presbyopia: Secondary | ICD-10-CM | POA: Diagnosis not present

## 2020-06-23 DIAGNOSIS — H35033 Hypertensive retinopathy, bilateral: Secondary | ICD-10-CM | POA: Diagnosis not present

## 2020-06-23 DIAGNOSIS — H25013 Cortical age-related cataract, bilateral: Secondary | ICD-10-CM | POA: Diagnosis not present

## 2020-06-23 DIAGNOSIS — H04123 Dry eye syndrome of bilateral lacrimal glands: Secondary | ICD-10-CM | POA: Diagnosis not present

## 2020-06-27 ENCOUNTER — Ambulatory Visit: Payer: Medicare PPO | Admitting: Podiatry

## 2020-07-07 DIAGNOSIS — D485 Neoplasm of uncertain behavior of skin: Secondary | ICD-10-CM | POA: Diagnosis not present

## 2020-07-07 DIAGNOSIS — C4441 Basal cell carcinoma of skin of scalp and neck: Secondary | ICD-10-CM | POA: Diagnosis not present

## 2020-07-07 DIAGNOSIS — L570 Actinic keratosis: Secondary | ICD-10-CM | POA: Diagnosis not present

## 2020-07-07 DIAGNOSIS — L409 Psoriasis, unspecified: Secondary | ICD-10-CM | POA: Diagnosis not present

## 2020-07-31 ENCOUNTER — Other Ambulatory Visit: Payer: Self-pay

## 2020-07-31 ENCOUNTER — Ambulatory Visit (INDEPENDENT_AMBULATORY_CARE_PROVIDER_SITE_OTHER): Payer: Medicare PPO | Admitting: Podiatry

## 2020-07-31 ENCOUNTER — Encounter: Payer: Self-pay | Admitting: Podiatry

## 2020-07-31 DIAGNOSIS — M79675 Pain in left toe(s): Secondary | ICD-10-CM | POA: Diagnosis not present

## 2020-07-31 DIAGNOSIS — M79674 Pain in right toe(s): Secondary | ICD-10-CM | POA: Diagnosis not present

## 2020-07-31 DIAGNOSIS — B351 Tinea unguium: Secondary | ICD-10-CM

## 2020-08-02 DIAGNOSIS — D485 Neoplasm of uncertain behavior of skin: Secondary | ICD-10-CM | POA: Diagnosis not present

## 2020-08-02 DIAGNOSIS — C4441 Basal cell carcinoma of skin of scalp and neck: Secondary | ICD-10-CM | POA: Diagnosis not present

## 2020-08-03 NOTE — Progress Notes (Signed)
Subjective: Jose Meyer is a pleasant 74 y.o. male patient seen today painful mycotic nails b/l that are difficult to trim. Pain interferes with ambulation. Aggravating factors include wearing enclosed shoe gear. Pain is relieved with periodic professional debridement.    Past Medical History:  Diagnosis Date  . Colon cancer [facility])    s/p R hemicolectomy 06/2008  . Coronary artery disease    Non-Obstructive;  LHC 2000 no sig dz; ETT-Cardiolite 2007 with fixed Inf defect;  LHC 4/07:  EF 60%, CFX 30-40%, mRCA 30-40% (prob false + CLite)  . CVA (cerebral infarction)    cerebellar CVA 2000: doc by MRI; Poss TIA 7/10 with vertigo; MRI 7/10 => only small vessel disease; Carotids 9/10: no sig disease;  TEE 9/10: normal LV and RV size and systolic fxn, no PFO, no sig valvular dz, at least Gr III plaque in arch and desc thoracic aorta (no ulceration or mobility)  . Diverticulosis   . H/O echocardiogram    Echo 9/13: EF 55-60%, mod LVH, grade 1 diast dysfn, mild to mod LAE  . Hyperlipidemia   . Hypertension   . Psoriasis     Patient Active Problem List   Diagnosis Date Noted  . Bilateral sensorineural hearing loss 07/01/2017  . Palpitations 07/19/2012  . CVA (cerebral infarction) 07/02/2011  . HYPERLIPIDEMIA-MIXED 06/26/2009  . Essential hypertension 06/26/2009  . CAD, NATIVE VESSEL 06/26/2009  . CVA 06/26/2009  . Transient cerebral ischemia 06/26/2009    Current Outpatient Medications on File Prior to Visit  Medication Sig Dispense Refill  . amLODipine (NORVASC) 5 MG tablet Take 5 mg by mouth every morning.    Marland Kitchen aspirin 81 MG tablet Take 81 mg by mouth at bedtime.     Marland Kitchen atorvastatin (LIPITOR) 10 MG tablet Take 10 mg by mouth at bedtime.     . betamethasone dipropionate 0.05 % lotion Apply 2.59 application topically as needed. For the scalp after shower  0  . clopidogrel (PLAVIX) 75 MG tablet Take 75 mg by mouth daily.     . clotrimazole (LOTRIMIN) 1 % cream APPLY TO BOTH FEET AND  BETWEEN TOES TWICE DAILY 30 g 1  . ELIDEL 1 % cream Apply 1 application topically as needed. For skin  0  . fluorometholone (FML) 0.1 % ophthalmic suspension INT 1 GTT INTO OU UTD PRN  4  . fluorouracil (EFUDEX) 5 % cream     . hydrochlorothiazide (HYDRODIURIL) 25 MG tablet Take 25 mg by mouth every morning.     Marland Kitchen ketoconazole (NIZORAL) 2 % cream     . losartan (COZAAR) 50 MG tablet Take 50 mg by mouth every morning.    . methocarbamol (ROBAXIN) 500 MG tablet TK 1 T PO Q 6 H PRF BACK SPASMS    . metoprolol (TOPROL-XL) 100 MG 24 hr tablet Take 50-100 mg by mouth as directed. TAKE 50 MG IN THE AM AND TAKE 100 MG IN THE PM    . NON FORMULARY Antifungal toenail solution from [facility], faxed 05/12/2019 [facility]    . sildenafil (VIAGRA) 50 MG tablet TK 1 T PO ONCE A DAY 30 MIN TO 4 H PRIOR TO SEXUAL ACTIVITY PRN    . SYSTANE ULTRA 0.4-0.3 % SOLN SMARTSIG:1 Drop(s) In Eye(s) As Needed    . triamcinolone cream (KENALOG) 0.1 %     . triamcinolone ointment (KENALOG) 0.1 % APP EXT AA BID FOR 14 DAYS    . triamcinolone ointment (KENALOG) 0.5 % APP EXT AA BID  FOR 5 DAYS     No current facility-administered medications on file prior to visit.    No Known Allergies  Objective: Physical Exam  General: Jose Meyer is a pleasant 74 y.o.  Caucasian  male, in NAD. AAO x 3.   Vascular:  Neurovascular status unchanged b/l lower extremities. Capillary refill time to digits immediate b/l. Palpable DP pulses b/l. Palpable PT pulses b/l. Pedal hair absent b/l. Skin temperature gradient within normal limits b/l.  Dermatological:  Pedal skin with normal turgor, texture and tone bilaterally. No open wounds bilaterally. No interdigital macerations bilaterally. Toenails 1-5 b/l elongated, discolored, dystrophic, thickened, crumbly with subungual debris and tenderness to dorsal palpation.  Musculoskeletal:  Normal muscle strength 5/5 to all lower extremity muscle groups bilaterally. No pain crepitus  or joint limitation noted with ROM b/l. No gross bony deformities bilaterally.  Neurological:  Protective sensation intact 5/5 intact bilaterally with 10g monofilament b/l. Vibratory sensation intact b/l. Proprioception intact bilaterally.  Assessment and Plan:  1. Pain due to onychomycosis of toenails of both feet    -Examined patient. -No new findings. No new orders. -Toenails 1-5 b/l were debrided in length and girth with sterile nail nippers and dremel without iatrogenic bleeding. Continue Nonformulary topical compunded antifungal solution from [place]. -Patient to report any pedal injuries to medical professional immediately. -Patient to continue soft, supportive shoe gear daily. -Patient/POA to call should there be question/concern in the interim.  Return in about 3 months (around 10/31/2020).  Marzetta Board, DPM

## 2020-08-04 ENCOUNTER — Other Ambulatory Visit: Payer: Self-pay

## 2020-08-04 ENCOUNTER — Encounter (HOSPITAL_COMMUNITY): Payer: Self-pay | Admitting: Cardiology

## 2020-08-04 ENCOUNTER — Ambulatory Visit (HOSPITAL_COMMUNITY)
Admission: RE | Admit: 2020-08-04 | Discharge: 2020-08-04 | Disposition: A | Discharge status: Home / Self Care | Payer: Medicare PPO | Source: Ambulatory Visit | Attending: Cardiology | Admitting: Cardiology

## 2020-08-04 VITALS — BP 120/70 | HR 75 | Wt 213.6 lb

## 2020-08-04 DIAGNOSIS — Z8673 Personal history of transient ischemic attack (TIA), and cerebral infarction without residual deficits: Secondary | ICD-10-CM | POA: Insufficient documentation

## 2020-08-04 DIAGNOSIS — I2510 Atherosclerotic heart disease of native coronary artery without angina pectoris: Secondary | ICD-10-CM | POA: Diagnosis not present

## 2020-08-04 DIAGNOSIS — I10 Essential (primary) hypertension: Secondary | ICD-10-CM | POA: Insufficient documentation

## 2020-08-04 DIAGNOSIS — E785 Hyperlipidemia, unspecified: Secondary | ICD-10-CM | POA: Diagnosis not present

## 2020-08-04 LAB — COMPREHENSIVE METABOLIC PANEL
ALT: 26 U/L (ref 0–44)
AST: 22 U/L (ref 15–41)
Albumin: 3.8 g/dL (ref 3.5–5.0)
Alkaline Phosphatase: 53 U/L (ref 38–126)
Anion gap: 10 (ref 5–15)
BUN: 24 mg/dL — ABNORMAL HIGH (ref 8–23)
CO2: 27 mmol/L (ref 22–32)
Calcium: 9.4 mg/dL (ref 8.9–10.3)
Chloride: 103 mmol/L (ref 98–111)
Creatinine, Ser: 1.04 mg/dL (ref 0.61–1.24)
GFR, Estimated: >60 mL/min (ref >60)
Glucose, Bld: 120 mg/dL — ABNORMAL HIGH (ref 70–99)
Potassium: 3.9 mmol/L (ref 3.5–5.1)
Sodium: 140 mmol/L (ref 135–145)
Total Bilirubin: 1.1 mg/dL (ref 0.3–1.2)
Total Protein: 6.7 g/dL (ref 6.5–8.1)

## 2020-08-04 LAB — LIPID PANEL
Cholesterol: 128 mg/dL (ref 0–200)
HDL: 50 mg/dL (ref >40)
LDL Cholesterol: 44 mg/dL (ref 0–99)
Total CHOL/HDL Ratio: 2.6 RATIO
Triglycerides: 172 mg/dL — ABNORMAL HIGH (ref <150)
VLDL: 34 mg/dL (ref 0–40)

## 2020-08-04 LAB — CBC
HCT: 51 % (ref 39.0–52.0)
Hemoglobin: 16.8 g/dL (ref 13.0–17.0)
MCH: 31.7 pg (ref 26.0–34.0)
MCHC: 32.9 g/dL (ref 30.0–36.0)
MCV: 96.2 fL (ref 80.0–100.0)
Platelets: 201 10*3/uL (ref 150–400)
RBC: 5.3 MIL/uL (ref 4.22–5.81)
RDW: 12.9 % (ref 11.5–15.5)
WBC: 6.8 10*3/uL (ref 4.0–10.5)
nRBC: 0 % (ref 0.0–0.2)

## 2020-08-04 NOTE — Patient Instructions (Signed)
It was great to see you today! No medication changes are needed at this time.  Labs today We will only contact you if something comes back abnormal or we need to make some changes. Otherwise no news is good news!  Your physician recommends that you schedule a follow-up appointment in: 1 year with Dr Aundra Dubin -please call our office to schedule this appointment  If you have any questions or concerns before your next appointment please send [place] a message through [facility] or call our office at [phone].    TO LEAVE A MESSAGE FOR THE NURSE SELECT OPTION 2, PLEASE LEAVE A MESSAGE INCLUDING: . YOUR NAME . DATE OF BIRTH . CALL BACK NUMBER . REASON FOR CALL**this is important as we prioritize the call backs  YOU WILL RECEIVE A CALL BACK THE SAME DAY AS LONG AS YOU CALL BEFORE 4:00 PM

## 2020-08-06 NOTE — Progress Notes (Signed)
Patient ID: Jose Meyer, male   DOB: 1946-09-13, 74 y.o.   MRN: [id number] PCP: Dr. Darcus Austin Cardiology: Dr. Aundra Dubin  74 y.o.with history of prior cerebellar CVA, nonobstructive CAD, HTN, hyperlipidemia presents for followup of CAD, HTN, CVA.  Patient has a history of cerebellar CVA in 2000, documented by MRI.  Symptoms at that time were difficulty with balace and discoordination.  No vertigo at that time.  In 7/10, pt had just gotten dressed for church one Sunday and was not doing anything in particular.  He developed a severe spinning sensation like vertigo that lasted for about 5 minutes.  He was "woozy" for about 1.5 hours after that before finally returning to normal.  He does not remember having the vertigo set off by moving his head in a particular direction.  He went to the ER and had an MRI of his head done, showing only small vessel disease.  There was some concern that this could have been a posterior circulation TIA given prior history of cerebellar CVA.  He did have a TEE in 9/10 showing no PFO, normal LV and RV systolic function, grade III plaque in the aortic arch/descending thoracic aorta.  Patient has known nonobstructive CAD from cath in 2007.  Last echo in 9/13 showed normal EF. Cardiolite was done in 2/18, no ischemia (low risk study).    Patient has been stable over the last year.  Weight is down 34 lbs, he has been dieting.  No chest pain. No stroke-like symptoms.  He is walking 2.5 miles/day with no exertional dyspnea.   Labs (8/10): creatinine 1.0, LDL 73 Labs (9/12): K 4.1, creatinine 0.83, LDL 53, HDL 42, TGs 131 Labs (9/13): K 4.1, creatinine 0.9, TSH normal Labs (12/15): LDL 62, HDL 51, LFTs normal, K 4, creatinine 0.98 Labs (12/17): K 3.6, creatinine 0.96, hgb 17.1, LDL 53, HDL 67 Labs (1/19): LDL 54, HDL 50 Labs (7/19): K 4.2, creatinine 0.88 Labs (9/19): LDL 59, HDL 47 Labs (9/20): LDL 53, TGs 150, HDL 50, K 3.7, creatinine 0.93  ECG (personally reviewed): NSR  normal  Allergies (verified):  No Known Drug Allergies  Past Medical History: 1. Hyperlipidemia 2. Cerebellar CVA 2000: documented by MRI.  Possible TIA 7/10 with vertigo.  MRI 7/10 showed only small vessel disease.  Possible TIA 7/10.  Carotid dopplers (9/10) with no significant disease.  TEE (9/10): Normal LV and RV size and systolic function.  No PFO.  No significant valvular disease.  At least grade III plaque in the arch and descending thoracic aorta with no ulceration or mobility.  3. HTN 4. History of colon CA s/p R hemicolectomy in 9/09.  5. psoriasis 6. diverticulosis 7. CAD: nonobstructive.  LHC in 2000 without significant disease.  ETT-cardiolite 2007 with fixed inferior defect.  LHC (4/07) showed EF 60%, 30-40% CFX stenosis, 30-40% mRCA stenosis.  Probably false positive cardiolite.  Echo (9/13): EF 55-60%, mild LVH, grade I diastolic dysfunction, mild to moderate LAE. - Cardiolite (2/18): Medium-sized, fixed inferolateral perfusion defect (likely artifact given normal wall motion), no ischemia, EF 64%.  8. Palpitations: PVCs.  30 day event monitor in 9/13 with occasional PVCs but no more significant arrhythmias.   9. Chronic low back pain.  10. Plantar fasciitis  Family History: No premature CAD.   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 Lives in [place].  No smoking since 1975.  Occasional ETOH.  Married with 2 children [facility], cardiologist in [place]).  Enjoys woodworking.  ROS: All systems reviewed and negative except as per HPI.   Current Outpatient Medications  Medication Sig Dispense Refill  . amLODipine (NORVASC) 5 MG tablet Take 5 mg by mouth every morning.    Marland Kitchen aspirin 81 MG tablet Take 81 mg by mouth at bedtime.     Marland Kitchen atorvastatin (LIPITOR) 10 MG tablet Take 10 mg by mouth at bedtime.     . betamethasone dipropionate 0.05 % lotion Apply 7.10 application topically as needed. For the scalp after  shower  0  . clopidogrel (PLAVIX) 75 MG tablet Take 75 mg by mouth daily.     . clotrimazole (LOTRIMIN) 1 % cream APPLY TO BOTH FEET AND BETWEEN TOES TWICE DAILY 30 g 1  . ELIDEL 1 % cream Apply 1 application topically as needed. For skin  0  . fluorometholone (FML) 0.1 % ophthalmic suspension INT 1 GTT INTO OU UTD PRN  4  . fluorouracil (EFUDEX) 5 % cream     . hydrochlorothiazide (HYDRODIURIL) 25 MG tablet Take 25 mg by mouth every morning.     Marland Kitchen ketoconazole (NIZORAL) 2 % cream     . losartan (COZAAR) 50 MG tablet Take 50 mg by mouth every morning.    . methocarbamol (ROBAXIN) 500 MG tablet TK 1 T PO Q 6 H PRF BACK SPASMS    . metoprolol (TOPROL-XL) 100 MG 24 hr tablet Take 50-100 mg by mouth as directed. TAKE 50 MG IN THE AM AND TAKE 100 MG IN THE PM    . NON FORMULARY Antifungal toenail solution from [facility], faxed 05/12/2019 [facility]    . SYSTANE ULTRA 0.4-0.3 % SOLN SMARTSIG:1 Drop(s) In Eye(s) As Needed    . triamcinolone cream (KENALOG) 0.1 %     . triamcinolone ointment (KENALOG) 0.1 % APP EXT AA BID FOR 14 DAYS    . triamcinolone ointment (KENALOG) 0.5 % APP EXT AA BID FOR 5 DAYS     No current facility-administered medications for this encounter.    BP 120/70   Pulse 75   Wt 96.9 kg (213 lb 9.6 oz)   SpO2 96%   BMI 28.97 kg/m  General: NAD Neck: No JVD, no thyromegaly or thyroid nodule.  Lungs: Clear to auscultation bilaterally with normal respiratory effort. CV: Nondisplaced PMI.  Heart regular S1/S2, no S3/S4, no murmur.  No peripheral edema.  No carotid bruit.  Normal pedal pulses.  Abdomen: Soft, nontender, no hepatosplenomegaly, no distention.  Skin: Intact without lesions or rashes.  Neurologic: Alert and oriented x 3.  Psych: Normal affect. Extremities: No clubbing or cyanosis.  HEENT: Normal.   Assessment/Plan:  1. CAD: Nonobstructive CAD on last cath in 2007. Cardiolite in 2/18 with no ischemia.  No further chest pain episodes. Good exercise  tolerance.  - Continue ASA, Plavix, ARB, Toprol XL, and statin.   2. HYPERLIPIDEMIA: Check lipids today, goal LDL < 70.  3. HYPERTENSION: BP has been well-controlled, continue current meds.  4. CVA: H/o CVA/TIA in the past. He will continue on Plavix. No recent neurological symptoms.   Followup 1 year    Loralie Champagne 08/06/2020

## 2020-10-18 DIAGNOSIS — D225 Melanocytic nevi of trunk: Secondary | ICD-10-CM | POA: Diagnosis not present

## 2020-10-18 DIAGNOSIS — D485 Neoplasm of uncertain behavior of skin: Secondary | ICD-10-CM | POA: Diagnosis not present

## 2020-10-18 DIAGNOSIS — Z86018 Personal history of other benign neoplasm: Secondary | ICD-10-CM | POA: Diagnosis not present

## 2020-10-18 DIAGNOSIS — L821 Other seborrheic keratosis: Secondary | ICD-10-CM | POA: Diagnosis not present

## 2020-10-18 DIAGNOSIS — L814 Other melanin hyperpigmentation: Secondary | ICD-10-CM | POA: Diagnosis not present

## 2020-10-18 DIAGNOSIS — Z85828 Personal history of other malignant neoplasm of skin: Secondary | ICD-10-CM | POA: Diagnosis not present

## 2020-10-18 DIAGNOSIS — D2271 Melanocytic nevi of right lower limb, including hip: Secondary | ICD-10-CM | POA: Diagnosis not present

## 2020-10-18 DIAGNOSIS — C4441 Basal cell carcinoma of skin of scalp and neck: Secondary | ICD-10-CM | POA: Diagnosis not present

## 2020-10-18 DIAGNOSIS — D2272 Melanocytic nevi of left lower limb, including hip: Secondary | ICD-10-CM | POA: Diagnosis not present

## 2020-10-18 DIAGNOSIS — L570 Actinic keratosis: Secondary | ICD-10-CM | POA: Diagnosis not present

## 2020-10-18 DIAGNOSIS — L578 Other skin changes due to chronic exposure to nonionizing radiation: Secondary | ICD-10-CM | POA: Diagnosis not present

## 2020-11-06 ENCOUNTER — Other Ambulatory Visit: Payer: Self-pay

## 2020-11-06 ENCOUNTER — Encounter: Payer: Self-pay | Admitting: Podiatry

## 2020-11-06 ENCOUNTER — Ambulatory Visit: Payer: Medicare PPO | Admitting: Podiatry

## 2020-11-06 DIAGNOSIS — Z8673 Personal history of transient ischemic attack (TIA), and cerebral infarction without residual deficits: Secondary | ICD-10-CM | POA: Insufficient documentation

## 2020-11-06 DIAGNOSIS — E7800 Pure hypercholesterolemia, unspecified: Secondary | ICD-10-CM | POA: Insufficient documentation

## 2020-11-06 DIAGNOSIS — Z85038 Personal history of other malignant neoplasm of large intestine: Secondary | ICD-10-CM | POA: Insufficient documentation

## 2020-11-06 DIAGNOSIS — Z9229 Personal history of other drug therapy: Secondary | ICD-10-CM

## 2020-11-06 DIAGNOSIS — M79675 Pain in left toe(s): Secondary | ICD-10-CM | POA: Diagnosis not present

## 2020-11-06 DIAGNOSIS — B351 Tinea unguium: Secondary | ICD-10-CM | POA: Diagnosis not present

## 2020-11-06 DIAGNOSIS — N529 Male erectile dysfunction, unspecified: Secondary | ICD-10-CM | POA: Insufficient documentation

## 2020-11-06 DIAGNOSIS — H35039 Hypertensive retinopathy, unspecified eye: Secondary | ICD-10-CM | POA: Insufficient documentation

## 2020-11-06 DIAGNOSIS — M79674 Pain in right toe(s): Secondary | ICD-10-CM | POA: Diagnosis not present

## 2020-11-06 DIAGNOSIS — L853 Xerosis cutis: Secondary | ICD-10-CM

## 2020-11-06 DIAGNOSIS — Z6832 Body mass index (BMI) 32.0-32.9, adult: Secondary | ICD-10-CM | POA: Insufficient documentation

## 2020-11-06 DIAGNOSIS — I209 Angina pectoris, unspecified: Secondary | ICD-10-CM | POA: Insufficient documentation

## 2020-11-06 NOTE — Patient Instructions (Signed)
For extremely dry, cracked feet: moisturize feet once daily; do not apply between toes A. CeraVe Healing Ointment B. Aquaphor Healing Ointment C. Vaseline Petroleum Healing Jelly   If you have problems reaching your feet: apply to feet once daily; do not apply between toes A.  Aquaphor Advanced Therapy Ointment Body Spray B.  Vaseline Intensive Care Spray Lotion Advanced Repair   

## 2020-11-10 NOTE — Progress Notes (Signed)
Subjective: Jose Meyer is a pleasant 75 y.o. male patient seen today painful mycotic nails b/l that are difficult to trim. Pain interferes with ambulation. Aggravating factors include wearing enclosed shoe gear. Pain is relieved with periodic professional debridement.    He voice no new pedal problems on today's visit.  Past Medical History:  Diagnosis Date  . Colon cancer [facility])    s/p R hemicolectomy 06/2008  . Coronary artery disease    Non-Obstructive;  LHC 2000 no sig dz; ETT-Cardiolite 2007 with fixed Inf defect;  LHC 4/07:  EF 60%, CFX 30-40%, mRCA 30-40% (prob false + CLite)  . CVA (cerebral infarction)    cerebellar CVA 2000: doc by MRI; Poss TIA 7/10 with vertigo; MRI 7/10 => only small vessel disease; Carotids 9/10: no sig disease;  TEE 9/10: normal LV and RV size and systolic fxn, no PFO, no sig valvular dz, at least Gr III plaque in arch and desc thoracic aorta (no ulceration or mobility)  . Diverticulosis   . H/O echocardiogram    Echo 9/13: EF 55-60%, mod LVH, grade 1 diast dysfn, mild to mod LAE  . Hyperlipidemia   . Hypertension   . Psoriasis     Patient Active Problem List   Diagnosis Date Noted  . Angina pectoris ([place]) 11/06/2020  . Body mass index (BMI) 32.0-32.9, adult 11/06/2020  . ED (erectile dysfunction) of organic origin 11/06/2020  . History of malignant neoplasm of colon 11/06/2020  . Hypertensive retinopathy 11/06/2020  . Personal history of transient ischemic attack (TIA), and cerebral infarction without residual deficits 11/06/2020  . Pure hypercholesterolemia 11/06/2020  . Bilateral sensorineural hearing loss 07/01/2017  . Palpitations 07/19/2012  . CVA (cerebral infarction) 07/02/2011  . HYPERLIPIDEMIA-MIXED 06/26/2009  . Essential hypertension 06/26/2009  . CAD, NATIVE VESSEL 06/26/2009  . CVA 06/26/2009  . Transient cerebral ischemia 06/26/2009    Current Outpatient Medications on File Prior to Visit  Medication Sig Dispense Refill  .  fluticasone (FLONASE ALLERGY RELIEF) 50 MCG/ACT nasal spray 1 spray in each nostril    . sildenafil (VIAGRA) 50 MG tablet 1 tablet as needed 30 min to 4 hours prior to sexual activity    . amLODipine (NORVASC) 5 MG tablet Take 5 mg by mouth every morning.    Marland Kitchen aspirin 81 MG chewable tablet 1 tablet    . atorvastatin (LIPITOR) 10 MG tablet Take 1 tablet by mouth daily.    . betamethasone dipropionate 0.05 % lotion Apply 3.32 application topically as needed. For the scalp after shower  0  . betamethasone, augmented, (DIPROLENE) 9.51 % lotion 1 application to affected area    . cephALEXin (KEFLEX) 500 MG capsule Take 500 mg by mouth 4 (four) times daily.    . clopidogrel (PLAVIX) 75 MG tablet 1 tablet    . clotrimazole (LOTRIMIN) 1 % cream APPLY TO BOTH FEET AND BETWEEN TOES TWICE DAILY 30 g 1  . diclofenac Sodium (VOLTAREN) 1 % GEL 1 application    . ELIDEL 1 % cream Apply 1 application topically as needed. For skin  0  . fluorometholone (FML) 0.1 % ophthalmic suspension INT 1 GTT INTO OU UTD PRN  4  . fluorouracil (EFUDEX) 5 % cream     . furosemide (LASIX) 20 MG tablet 1 tablet    . hydrochlorothiazide (HYDRODIURIL) 25 MG tablet Take 1 tablet by mouth daily.    Marland Kitchen ketoconazole (NIZORAL) 2 % cream     . losartan (COZAAR) 50 MG tablet Take 1  tablet by mouth daily.    . methocarbamol (ROBAXIN) 500 MG tablet TK 1 T PO Q 6 H PRF BACK SPASMS    . metoprolol (TOPROL-XL) 100 MG 24 hr tablet Take 50-100 mg by mouth as directed. TAKE 50 MG IN THE AM AND TAKE 100 MG IN THE PM    . Multiple Vitamins-Minerals (MULTI FOR HIM 50+) TABS 1 tablet    . NON FORMULARY Antifungal toenail solution from [facility], faxed 05/12/2019 HS-[facility]    . SYSTANE ULTRA 0.4-0.3 % SOLN SMARTSIG:1 Drop(s) In Eye(s) As Needed    . triamcinolone cream (KENALOG) 0.1 %     . triamcinolone ointment (KENALOG) 0.1 % APP EXT AA BID FOR 14 DAYS    . triamcinolone ointment (KENALOG) 0.5 % APP EXT AA BID FOR 5 DAYS     No current  facility-administered medications on file prior to visit.    No Known Allergies  Objective: Physical Exam  General: Jose Meyer is a pleasant 75 y.o.  Caucasian  male, in NAD. AAO x 3.   Vascular:  Neurovascular status unchanged b/l lower extremities. Capillary refill time to digits immediate b/l. Palpable DP pulses b/l. Palpable PT pulses b/l. Pedal hair absent b/l. Skin temperature gradient within normal limits b/l.  Dermatological:  Pedal skin with normal turgor, texture and tone bilaterally. No open wounds bilaterally. No interdigital macerations bilaterally. Toenails 1-5 b/l elongated, discolored, dystrophic, thickened, crumbly with subungual debris and tenderness to dorsal palpation. Pedal skin noted to be dry and flaky b/l lower extremities.  Musculoskeletal:  Normal muscle strength 5/5 to all lower extremity muscle groups bilaterally. No pain crepitus or joint limitation noted with ROM b/l. No gross bony deformities bilaterally.  Neurological:  Protective sensation intact 5/5 intact bilaterally with 10g monofilament b/l. Vibratory sensation intact b/l. Proprioception intact bilaterally.  Assessment and Plan:  1. Pain due to onychomycosis of toenails of both feet   2. Xerosis cutis   3. Hx of long term use of blood thinners    -Examined patient. -No new findings. No new orders. -Toenails 1-5 b/l were debrided in length and girth with sterile nail nippers and dremel without iatrogenic bleeding. -Continue Nonformulary topical compunded antifungal solution from [place]. -Discussed dry pedal skin. Dispensed list of moisturizers for dry skin.  -Patient to report any pedal injuries to medical professional immediately. -Patient to continue soft, supportive shoe gear daily. -Patient/POA to call should there be question/concern in the interim.  Return in about 3 months (around 02/04/2021).  Marzetta Board, DPM

## 2020-11-28 DIAGNOSIS — I10 Essential (primary) hypertension: Secondary | ICD-10-CM | POA: Diagnosis not present

## 2020-11-28 DIAGNOSIS — N529 Male erectile dysfunction, unspecified: Secondary | ICD-10-CM | POA: Diagnosis not present

## 2020-11-28 DIAGNOSIS — I25119 Atherosclerotic heart disease of native coronary artery with unspecified angina pectoris: Secondary | ICD-10-CM | POA: Diagnosis not present

## 2020-11-28 DIAGNOSIS — R7309 Other abnormal glucose: Secondary | ICD-10-CM | POA: Diagnosis not present

## 2020-11-28 DIAGNOSIS — Z0000 Encounter for general adult medical examination without abnormal findings: Secondary | ICD-10-CM | POA: Diagnosis not present

## 2020-11-28 DIAGNOSIS — E7800 Pure hypercholesterolemia, unspecified: Secondary | ICD-10-CM | POA: Diagnosis not present

## 2020-11-28 DIAGNOSIS — R2689 Other abnormalities of gait and mobility: Secondary | ICD-10-CM | POA: Diagnosis not present

## 2020-11-29 DIAGNOSIS — D044 Carcinoma in situ of skin of scalp and neck: Secondary | ICD-10-CM | POA: Diagnosis not present

## 2020-11-29 DIAGNOSIS — L820 Inflamed seborrheic keratosis: Secondary | ICD-10-CM | POA: Diagnosis not present

## 2020-12-01 ENCOUNTER — Other Ambulatory Visit: Payer: Self-pay

## 2020-12-01 ENCOUNTER — Ambulatory Visit: Payer: Medicare PPO | Attending: Family Medicine | Admitting: Physical Therapy

## 2020-12-01 ENCOUNTER — Encounter: Payer: Self-pay | Admitting: Physical Therapy

## 2020-12-01 DIAGNOSIS — R279 Unspecified lack of coordination: Secondary | ICD-10-CM | POA: Insufficient documentation

## 2020-12-01 NOTE — Therapy (Signed)
[facility] [address], [place], [place], [address] Phone: [phone]   Fax:  [phone]  Physical Therapy Evaluation/1x visit   Patient Details  Name: Jose Meyer MRN: [id number] Date of Birth: 10-22-1945 Referring Provider (PT): Dr. London Pepper   Encounter Date: 12/01/2020   PT End of Session - 12/01/20 1034    Date for PT Re-Evaluation 12/29/20    Authorization Type [employer] cohere    PT Start Time 0919    PT Stop Time 1015    PT Time Calculation (min) 56 min    Activity Tolerance Patient tolerated treatment well           Past Medical History:  Diagnosis Date  . Colon cancer [facility])    s/p R hemicolectomy 06/2008  . Coronary artery disease    Non-Obstructive;  LHC 2000 no sig dz; ETT-Cardiolite 2007 with fixed Inf defect;  LHC 4/07:  EF 60%, CFX 30-40%, mRCA 30-40% (prob false + CLite)  . CVA (cerebral infarction)    cerebellar CVA 2000: doc by MRI; Poss TIA 7/10 with vertigo; MRI 7/10 => only small vessel disease; Carotids 9/10: no sig disease;  TEE 9/10: normal LV and RV size and systolic fxn, no PFO, no sig valvular dz, at least Gr III plaque in arch and desc thoracic aorta (no ulceration or mobility)  . Diverticulosis   . H/O echocardiogram    Echo 9/13: EF 55-60%, mod LVH, grade 1 diast dysfn, mild to mod LAE  . Hyperlipidemia   . Hypertension   . Psoriasis     Past Surgical History:  Procedure Laterality Date  . KNEE SURGERY Left 07/2014    There were no vitals filed for this visit.    Subjective Assessment - 12/01/20 0923    Subjective My son who is a cardiologist made the comment that I'm hesitant to move upon rising.  I need to get my bearing when I first get up.  I do feel light headed at times.  I've lost 40 pounds over the last year (trying to lose weight).  Not as confident with mobility/balance.  I hold to a handrail on steps for security.    Limitations House hold activities     Diagnostic tests none    Patient Stated Goals I need to find out where I am stabilty wise;  are there things I can do or strategies/ex's that would improve stability/balance; reassurance in my mind    Currently in Pain? No/denies              [facility] PT Assessment - 12/01/20 0001      Assessment   Medical Diagnosis balance problems    Referring Provider (PT) Dr. London Pepper    Onset Date/Surgical Date --   6 months   Next MD Visit 6 months    Prior Therapy for back years ago      Precautions   Precautions None      Restrictions   Weight Bearing Restrictions No      Balance Screen   Has the patient fallen in the past 6 months No   > 6 months ago fell coming down step and turned to the left   Has the patient had a decrease in activity level because of a fear of falling?  No    Is the patient reluctant to leave their home because of a fear of falling?  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 analyst; work area in basement      Prior Function   Vocation Retired    Leisure build furniture in my shop; travel      Observation/Other Assessments   Activities of Balance Confidence Scale (ABC Scale)  86%      AROM   Overall AROM Comments limited right > left shoulder elevation with history on long standing rotator cuff issues      Standardized Balance Assessment   Five times sit to stand comments  12.46   30 sec sit to stand test 15x     Berg Balance Test   Sit to Stand Able to stand without using hands and stabilize independently    Standing Unsupported Able to stand safely 2 minutes    Sitting with Back Unsupported but Feet Supported on Floor or Stool Able to sit safely and securely 2 minutes    Stand to Sit Sits safely with minimal use of hands     Transfers Able to transfer safely, minor use of hands    Standing Unsupported with Eyes Closed Able to stand 10 seconds safely    Standing Unsupported with Feet Together Able to place feet together independently and stand 1 minute safely    From Standing, Reach Forward with Outstretched Arm Can reach confidently >25 cm (10")    From Standing Position, Pick up Object from Floor Able to pick up shoe safely and easily    From Standing Position, Turn to Look Behind Over each Shoulder Looks behind from both sides and weight shifts well    Turn 360 Degrees Able to turn 360 degrees safely in 4 seconds or less    Standing Unsupported, Alternately Place Feet on Step/Stool Able to stand independently and safely and complete 8 steps in 20 seconds    Standing Unsupported, One Foot in Front Able to plae foot ahead of the other independently and hold 30 seconds    Standing on One Leg Able to lift leg independently and hold equal to or more than 3 seconds    Total Score 53      Dynamic Gait Index   Level Surface Normal    Change in Gait Speed Normal    Gait with Horizontal Head Turns Normal    Gait with Vertical Head Turns Normal    Gait and Pivot Turn Normal    Step Over Obstacle Normal    Step Around Obstacles Normal    Steps Normal    Total Score 24      Timed Up and Go Test   Normal TUG (seconds) 7.84                      Objective measurements completed on examination: See above findings.               PT Education - 12/01/20 1030    Education Details standing balance HEP    Person(s) Educated Patient    Methods Explanation;Demonstration;Handout    Comprehension Returned demonstration;Verbalized understanding               PT Long Term Goals - 12/01/20 1046      PT LONG TERM GOAL #1   Title No follow up needed  at this time                  Plan - 12/01/20 1035    Clinical Impression Statement The patient is a pleasant 75 year old referred to  PT for balance assessment.  He reports no falls in the last 6 months.  His BERG balance test indicates minimal risk of falls with a score of 53/56.  His only deficit is with single leg standing and tandem standing.  His Dynamic Gait Index is normal with a score of 24/24.  His Timed up and Go test is above average at 7.84.  30 second sit to stand test without UE assist is 15x which is above normal for age/gender.  His self rated Activities Specific Balance confidence Scale is 85%.  Discussed findings with patient and it was agreed upon that since his scores on all tests were normal to above average, we would defer follow up at this time.  He was given a balance HEP to address minimal deficits with tandem and single standing.  If future needs arise, we would be happy to see Mr. Hopfensperger with a new referral.    Stability/Clinical Decision Making Stable/Uncomplicated    Clinical Decision Making Low    PT Frequency One time visit    PT Next Visit Plan no follow up needed at this time           Patient will benefit from skilled therapeutic intervention in order to improve the following deficits and impairments:  Decreased balance  Visit Diagnosis: Unspecified lack of coordination - Plan: PT plan of care cert/re-cert     Problem List Patient Active Problem List   Diagnosis Date Noted  . Angina pectoris ([place]) 11/06/2020  . Body mass index (BMI) 32.0-32.9, adult 11/06/2020  . ED (erectile dysfunction) of organic origin 11/06/2020  . History of malignant neoplasm of colon 11/06/2020  . Hypertensive retinopathy 11/06/2020  . Personal history of transient ischemic attack (TIA), and cerebral infarction without residual deficits 11/06/2020  . Pure hypercholesterolemia 11/06/2020  . Bilateral sensorineural hearing loss 07/01/2017  . Palpitations 07/19/2012  . CVA (cerebral infarction) 07/02/2011  . HYPERLIPIDEMIA-MIXED 06/26/2009  . Essential hypertension 06/26/2009  . CAD, NATIVE VESSEL  06/26/2009  . CVA 06/26/2009  . Transient cerebral ischemia 06/26/2009   Ruben Im, PT 12/01/20 10:50 AM Phone: [phone] Fax: [phone] Alvera Singh 12/01/2020, 10:49 AM  [facility] [address] [place], [place], [address] Phone: [phone]   Fax:  [phone]  Name: Jose Meyer MRN: [id number] Date of Birth: 1945-11-29

## 2020-12-01 NOTE — Patient Instructions (Signed)
Access Code: [email or web address] URL: [email or web address] Date: 12/01/2020 Prepared by: Ruben Im  Exercises Side Stepping with Counter Support - 1 x daily - 7 x weekly - 1 sets - 10 reps Backward Walking with Counter Support - 1 x daily - 7 x weekly - 1 sets - 10 reps Tandem Stance with Support - 1 x daily - 7 x weekly - 1 sets - 10 reps Single Leg Stance - 1 x daily - 7 x weekly - 1 sets - 10 reps Sit to Stand with Arms Crossed - 1 x daily - 7 x weekly - 1 sets - 10 reps

## 2020-12-20 DIAGNOSIS — Z86010 Personal history of colonic polyps: Secondary | ICD-10-CM | POA: Diagnosis not present

## 2020-12-20 DIAGNOSIS — Z85038 Personal history of other malignant neoplasm of large intestine: Secondary | ICD-10-CM | POA: Diagnosis not present

## 2020-12-22 ENCOUNTER — Telehealth: Payer: Self-pay | Admitting: *Deleted

## 2020-12-22 NOTE — Telephone Encounter (Signed)
Our office received yesterday and today a clearance request for the pt. Pt is seen by Dr. Aundra Dubin. Clearance request was faxed to Dr. Aundra Dubin yesterday. I will fax today's clearance request over as well.

## 2020-12-27 ENCOUNTER — Telehealth (HOSPITAL_COMMUNITY): Payer: Self-pay

## 2020-12-27 NOTE — Telephone Encounter (Signed)
[facility] medical clearance form for patients colonoscopy was signed and successfully faxed to [phone] on 12/25/20. A copy of the form will be scanned into the patients chart.

## 2021-01-10 DIAGNOSIS — Z01812 Encounter for preprocedural laboratory examination: Secondary | ICD-10-CM | POA: Diagnosis not present

## 2021-01-15 DIAGNOSIS — D123 Benign neoplasm of transverse colon: Secondary | ICD-10-CM | POA: Diagnosis not present

## 2021-01-15 DIAGNOSIS — Z85038 Personal history of other malignant neoplasm of large intestine: Secondary | ICD-10-CM | POA: Diagnosis not present

## 2021-01-16 DIAGNOSIS — L570 Actinic keratosis: Secondary | ICD-10-CM | POA: Diagnosis not present

## 2021-01-16 DIAGNOSIS — Z85828 Personal history of other malignant neoplasm of skin: Secondary | ICD-10-CM | POA: Diagnosis not present

## 2021-01-17 DIAGNOSIS — D123 Benign neoplasm of transverse colon: Secondary | ICD-10-CM | POA: Diagnosis not present

## 2021-02-19 ENCOUNTER — Ambulatory Visit: Payer: Medicare PPO | Admitting: Podiatry

## 2021-02-19 ENCOUNTER — Encounter: Payer: Self-pay | Admitting: Podiatry

## 2021-02-19 ENCOUNTER — Other Ambulatory Visit: Payer: Self-pay

## 2021-02-19 DIAGNOSIS — R2689 Other abnormalities of gait and mobility: Secondary | ICD-10-CM | POA: Insufficient documentation

## 2021-02-19 DIAGNOSIS — B351 Tinea unguium: Secondary | ICD-10-CM | POA: Diagnosis not present

## 2021-02-19 DIAGNOSIS — R7303 Prediabetes: Secondary | ICD-10-CM | POA: Diagnosis not present

## 2021-02-19 DIAGNOSIS — M79674 Pain in right toe(s): Secondary | ICD-10-CM | POA: Diagnosis not present

## 2021-02-19 DIAGNOSIS — M79675 Pain in left toe(s): Secondary | ICD-10-CM

## 2021-02-19 DIAGNOSIS — D689 Coagulation defect, unspecified: Secondary | ICD-10-CM | POA: Diagnosis not present

## 2021-02-19 DIAGNOSIS — L84 Corns and callosities: Secondary | ICD-10-CM

## 2021-02-19 NOTE — Progress Notes (Signed)
  Subjective:  Patient ID: Jose Meyer, male    DOB: Jun 11, 1946,  MRN: [id number]  Rathana Viveros Halberstam presents to clinic today for at risk foot care with h/o clotting disorder and painful thick toenails that are difficult to trim. Pain interferes with ambulation. Aggravating factors include wearing enclosed shoe gear. Pain is relieved with periodic professional debridement.Marland Kitchen  He voices no new pedal concerns on today's visit.  PCP is Dr. London Pepper and last visit was 12/13/2020.  No Known Allergies  Review of Systems: Negative except as noted in the HPI. Objective:   Constitutional JONAN SEUFERT is a pleasant 75 y.o. Caucasian male, in NAD. AAO x 3.   Vascular Capillary refill time to digits immediate b/l. Palpable pedal pulses b/l LE. Pedal hair absent. Lower extremity skin temperature gradient within normal limits. No pain with calf compression b/l. No cyanosis or clubbing noted.  Neurologic Normal speech. Oriented to person, place, and time. Protective sensation intact 5/5 intact bilaterally with 10g monofilament b/l.  Dermatologic Pedal skin with normal turgor, texture and tone bilaterally. No open wounds bilaterally. No interdigital macerations bilaterally. Toenails 1-5 b/l elongated, discolored, dystrophic, thickened, crumbly with subungual debris and tenderness to dorsal palpation. Hyperkeratotic lesion(s) b/l heels, L hallux and R hallux.  No erythema, no edema, no drainage, no fluctuance.  Orthopedic: Normal muscle strength 5/5 to all lower extremity muscle groups bilaterally. No pain crepitus or joint limitation noted with ROM b/l. No gross bony deformities bilaterally. Patient ambulates independent of any assistive aids.   Radiographs: None Assessment:   1. Pain due to onychomycosis of toenails of both feet   2. Callus   3. Clotting disorder ([place])   4. Prediabetes    Plan:  Patient was evaluated and treated and all questions answered.  Onychomycosis with  pain -Nails palliatively debridement as below -Educated on self-care  Procedure: Nail Debridement Rationale: Pain Type of Debridement: manual, sharp debridement. Instrumentation: Nail nipper, rotary burr. Number of Nails: 10 -Examined patient. -Patient to continue soft, supportive shoe gear daily. -Toenails 1-5 b/l were debrided in length and girth with sterile nail nippers and dremel without iatrogenic bleeding.  -Callus(es) b/l heels, L hallux and R hallux pared utilizing sterile scalpel blade. Light bleeding right hallux addressed with Lumicain hemostatic solution. Triple antibiotic ointment and band-aid applied. He was instructed to apply Neosporin once daily for one week. Call if he has any problems.  -Patient to report any pedal injuries to medical professional immediately. -Patient/POA to call should there be question/concern in the interim.  Return in about 3 months (around 05/22/2021).  Marzetta Board, DPM

## 2021-04-17 DIAGNOSIS — L821 Other seborrheic keratosis: Secondary | ICD-10-CM | POA: Diagnosis not present

## 2021-04-17 DIAGNOSIS — D2272 Melanocytic nevi of left lower limb, including hip: Secondary | ICD-10-CM | POA: Diagnosis not present

## 2021-04-17 DIAGNOSIS — Z86018 Personal history of other benign neoplasm: Secondary | ICD-10-CM | POA: Diagnosis not present

## 2021-04-17 DIAGNOSIS — L929 Granulomatous disorder of the skin and subcutaneous tissue, unspecified: Secondary | ICD-10-CM | POA: Diagnosis not present

## 2021-04-17 DIAGNOSIS — Z85828 Personal history of other malignant neoplasm of skin: Secondary | ICD-10-CM | POA: Diagnosis not present

## 2021-04-17 DIAGNOSIS — L578 Other skin changes due to chronic exposure to nonionizing radiation: Secondary | ICD-10-CM | POA: Diagnosis not present

## 2021-04-17 DIAGNOSIS — D2271 Melanocytic nevi of right lower limb, including hip: Secondary | ICD-10-CM | POA: Diagnosis not present

## 2021-04-17 DIAGNOSIS — C44519 Basal cell carcinoma of skin of other part of trunk: Secondary | ICD-10-CM | POA: Diagnosis not present

## 2021-04-17 DIAGNOSIS — D485 Neoplasm of uncertain behavior of skin: Secondary | ICD-10-CM | POA: Diagnosis not present

## 2021-04-17 DIAGNOSIS — D225 Melanocytic nevi of trunk: Secondary | ICD-10-CM | POA: Diagnosis not present

## 2021-04-17 DIAGNOSIS — L570 Actinic keratosis: Secondary | ICD-10-CM | POA: Diagnosis not present

## 2021-05-22 ENCOUNTER — Ambulatory Visit: Payer: Medicare PPO | Admitting: Podiatry

## 2021-05-22 ENCOUNTER — Other Ambulatory Visit: Payer: Self-pay

## 2021-05-22 ENCOUNTER — Encounter: Payer: Self-pay | Admitting: Podiatry

## 2021-05-22 DIAGNOSIS — M79674 Pain in right toe(s): Secondary | ICD-10-CM | POA: Diagnosis not present

## 2021-05-22 DIAGNOSIS — L84 Corns and callosities: Secondary | ICD-10-CM

## 2021-05-22 DIAGNOSIS — B351 Tinea unguium: Secondary | ICD-10-CM | POA: Diagnosis not present

## 2021-05-22 DIAGNOSIS — M79675 Pain in left toe(s): Secondary | ICD-10-CM | POA: Diagnosis not present

## 2021-05-22 DIAGNOSIS — D689 Coagulation defect, unspecified: Secondary | ICD-10-CM | POA: Diagnosis not present

## 2021-05-26 NOTE — Progress Notes (Signed)
Subjective: Jose Meyer is a pleasant 75 y.o. male patient seen today painful thick toenails that are difficult to trim. Pain interferes with ambulation. Aggravating factors include wearing enclosed shoe gear. Pain is relieved with periodic professional debridement.  He has h/o clotting disorder. Remains on Plavix daily.  PCP is London Pepper, MD. Last visit was: 12/13/2020.  No Known Allergies  Objective: Physical Exam  General: Jose Meyer is a pleasant 75 y.o. Caucasian male, in NAD. AAO x 3.   Vascular:  Capillary refill time to digits immediate b/l. Palpable DP pulse(s) b/l lower extremities Palpable PT pulse(s) b/l lower extremities Pedal hair absent. Lower extremity skin temperature gradient within normal limits. No pain with calf compression b/l. No edema noted b/l lower extremities.  Dermatological:  Pedal skin with normal turgor, texture and tone b/l lower extremities. No open wounds b/l lower extremities. No interdigital macerations b/l lower extremities. Toenails 1-5 b/l elongated, discolored, dystrophic, thickened, crumbly with subungual debris and tenderness to dorsal palpation. Hyperkeratotic lesion(s) L hallux and R hallux.  No erythema, no edema, no drainage, no fluctuance.  Musculoskeletal:  Normal muscle strength 5/5 to all lower extremity muscle groups bilaterally. No pain crepitus or joint limitation noted with ROM b/l lower extremities. No gross bony deformities b/l lower extremities.  Neurological:  Protective sensation intact 5/5 intact bilaterally with 10g monofilament b/l.  Assessment and Plan:  1. Pain due to onychomycosis of toenails of both feet   2. Callus   3. Clotting disorder ([place])      -Examined patient. -Patient to continue soft, supportive shoe gear daily. -Toenails 1-5 b/l were debrided in length and girth with sterile nail nippers and dremel without iatrogenic bleeding.  -Callus(es) L hallux and R hallux pared utilizing sterile  scalpel blade without complication or incident. Total number debrided =2. -Patient to report any pedal injuries to medical professional immediately. -Patient/POA to call should there be question/concern in the interim.  Return in about 3 months (around 08/22/2021).  Marzetta Board, DPM

## 2021-05-28 DIAGNOSIS — E7800 Pure hypercholesterolemia, unspecified: Secondary | ICD-10-CM | POA: Diagnosis not present

## 2021-05-28 DIAGNOSIS — R7303 Prediabetes: Secondary | ICD-10-CM | POA: Diagnosis not present

## 2021-05-28 DIAGNOSIS — I25119 Atherosclerotic heart disease of native coronary artery with unspecified angina pectoris: Secondary | ICD-10-CM | POA: Diagnosis not present

## 2021-05-28 DIAGNOSIS — I10 Essential (primary) hypertension: Secondary | ICD-10-CM | POA: Diagnosis not present

## 2021-05-29 DIAGNOSIS — C44519 Basal cell carcinoma of skin of other part of trunk: Secondary | ICD-10-CM | POA: Diagnosis not present

## 2021-06-13 DIAGNOSIS — R7303 Prediabetes: Secondary | ICD-10-CM | POA: Diagnosis not present

## 2021-06-13 DIAGNOSIS — E7800 Pure hypercholesterolemia, unspecified: Secondary | ICD-10-CM | POA: Diagnosis not present

## 2021-06-26 DIAGNOSIS — H2513 Age-related nuclear cataract, bilateral: Secondary | ICD-10-CM | POA: Diagnosis not present

## 2021-06-26 DIAGNOSIS — H35033 Hypertensive retinopathy, bilateral: Secondary | ICD-10-CM | POA: Diagnosis not present

## 2021-06-26 DIAGNOSIS — H04123 Dry eye syndrome of bilateral lacrimal glands: Secondary | ICD-10-CM | POA: Diagnosis not present

## 2021-06-26 DIAGNOSIS — H33312 Horseshoe tear of retina without detachment, left eye: Secondary | ICD-10-CM | POA: Diagnosis not present

## 2021-06-26 DIAGNOSIS — H25013 Cortical age-related cataract, bilateral: Secondary | ICD-10-CM | POA: Diagnosis not present

## 2021-07-16 DIAGNOSIS — M1712 Unilateral primary osteoarthritis, left knee: Secondary | ICD-10-CM | POA: Diagnosis not present

## 2021-07-24 DIAGNOSIS — L570 Actinic keratosis: Secondary | ICD-10-CM | POA: Diagnosis not present

## 2021-07-24 DIAGNOSIS — L710 Perioral dermatitis: Secondary | ICD-10-CM | POA: Diagnosis not present

## 2021-08-28 ENCOUNTER — Ambulatory Visit: Payer: Medicare PPO | Admitting: Podiatry

## 2021-08-28 ENCOUNTER — Encounter: Payer: Self-pay | Admitting: Podiatry

## 2021-08-28 ENCOUNTER — Other Ambulatory Visit: Payer: Self-pay

## 2021-08-28 DIAGNOSIS — M79675 Pain in left toe(s): Secondary | ICD-10-CM

## 2021-08-28 DIAGNOSIS — M79674 Pain in right toe(s): Secondary | ICD-10-CM | POA: Diagnosis not present

## 2021-08-28 DIAGNOSIS — D689 Coagulation defect, unspecified: Secondary | ICD-10-CM | POA: Diagnosis not present

## 2021-08-28 DIAGNOSIS — L84 Corns and callosities: Secondary | ICD-10-CM | POA: Diagnosis not present

## 2021-08-28 DIAGNOSIS — B351 Tinea unguium: Secondary | ICD-10-CM

## 2021-09-01 DIAGNOSIS — Z87898 Personal history of other specified conditions: Secondary | ICD-10-CM | POA: Insufficient documentation

## 2021-09-01 DIAGNOSIS — D485 Neoplasm of uncertain behavior of skin: Secondary | ICD-10-CM | POA: Insufficient documentation

## 2021-09-01 DIAGNOSIS — D2370 Other benign neoplasm of skin of unspecified lower limb, including hip: Secondary | ICD-10-CM | POA: Insufficient documentation

## 2021-09-01 DIAGNOSIS — C44519 Basal cell carcinoma of skin of other part of trunk: Secondary | ICD-10-CM | POA: Insufficient documentation

## 2021-09-01 DIAGNOSIS — C44712 Basal cell carcinoma of skin of right lower limb, including hip: Secondary | ICD-10-CM | POA: Insufficient documentation

## 2021-09-01 DIAGNOSIS — L219 Seborrheic dermatitis, unspecified: Secondary | ICD-10-CM | POA: Insufficient documentation

## 2021-09-01 DIAGNOSIS — Z85828 Personal history of other malignant neoplasm of skin: Secondary | ICD-10-CM | POA: Insufficient documentation

## 2021-09-01 DIAGNOSIS — Q828 Other specified congenital malformations of skin: Secondary | ICD-10-CM | POA: Insufficient documentation

## 2021-09-01 DIAGNOSIS — D225 Melanocytic nevi of trunk: Secondary | ICD-10-CM | POA: Insufficient documentation

## 2021-09-01 DIAGNOSIS — L409 Psoriasis, unspecified: Secondary | ICD-10-CM | POA: Insufficient documentation

## 2021-09-01 NOTE — Progress Notes (Signed)
  Subjective:  Patient ID: Jose Meyer, male    DOB: 15-Sep-1946,  MRN: [id number]  Monia Sabal Berenguer presents to clinic today for at risk foot care with h/o clotting disorder and callus(es) bilateral great toes and painful thick toenails that are difficult to trim. Painful toenails interfere with ambulation. Aggravating factors include wearing enclosed shoe gear. Pain is relieved with periodic professional debridement. Painful calluses are aggravated when weightbearing with and without shoegear. Pain is relieved with periodic professional debridement.  Patient relates discomfort of right great toe medial border today. Denies any redness, drainage or swelling. Painful when wearing enclosed shoe gear.  PCP is London Pepper, MD , and last visit was 12/13/2020.  No Known Allergies  Review of Systems: Negative except as noted in the HPI. Objective:   Constitutional DENE NAZIR is a pleasant 75 y.o. Caucasian male, WD, WN in NAD. AAO x 3.   Vascular CFT immediate b/l LE. Palpable DP/PT pulses b/l LE. Digital hair absent b/l. Skin temperature gradient WNL b/l. No pain with calf compression b/l. No edema noted b/l. No cyanosis or clubbing noted b/l LE. No cyanosis or clubbing noted.  Neurologic Normal speech. Oriented to person, place, and time. Protective sensation intact 5/5 intact bilaterally with 10g monofilament b/l. Vibratory sensation intact b/l. Proprioception intact bilaterally.  Dermatologic Pedal integument with normal turgor, texture and tone b/l LE. No open wounds b/l. No interdigital macerations b/l. Toenails 1-5 b/l elongated, thickened, discolored with subungual debris. +Tenderness with dorsal palpation of nailplates. Hyperkeratotic lesion(s) noted L hallux and R hallux. Incurvated nailplate medial border(s) R hallux.  Nail border hypertrophy absent. There is tenderness to palpation. Sign(s) of infection: no clinical signs of infection noted on examination today..  Orthopedic:  Normal muscle strength 5/5 to all lower extremity muscle groups bilaterally. No pain, crepitus or joint limitation noted with ROM b/l LE. No gross bony pedal deformities b/l. Patient ambulates independently without assistive aids.   Radiographs: None Assessment:   1. Pain due to onychomycosis of toenails of both feet   2. Callus   3. Clotting disorder [facility])    Plan:  Patient was evaluated and treated and all questions answered. Consent given for treatment as described below: -Examined patient. -Toenails 2-5 bilaterally and L hallux debrided in length and girth without iatrogenic bleeding with sterile nail nipper and dremel.  -Offending nail border debrided and curretaged R hallux utilizing sterile nail nipper and currette. Border cleansed with alcohol and triple antibiotic applied. No further treatment required by patient/caregiver. -Patient/POA to call should there be question/concern in the interim.  Return in about 3 months (around 11/28/2021).  Marzetta Board, DPM

## 2021-09-11 ENCOUNTER — Other Ambulatory Visit: Payer: Self-pay

## 2021-09-11 ENCOUNTER — Ambulatory Visit (HOSPITAL_COMMUNITY)
Admission: RE | Admit: 2021-09-11 | Discharge: 2021-09-11 | Disposition: A | Discharge status: Home / Self Care | Payer: Medicare PPO | Source: Ambulatory Visit | Attending: Cardiology | Admitting: Cardiology

## 2021-09-11 ENCOUNTER — Encounter (HOSPITAL_COMMUNITY): Payer: Self-pay | Admitting: Cardiology

## 2021-09-11 VITALS — BP 132/78 | HR 72 | Wt 232.0 lb

## 2021-09-11 DIAGNOSIS — Z8673 Personal history of transient ischemic attack (TIA), and cerebral infarction without residual deficits: Secondary | ICD-10-CM | POA: Diagnosis not present

## 2021-09-11 DIAGNOSIS — E7800 Pure hypercholesterolemia, unspecified: Secondary | ICD-10-CM

## 2021-09-11 DIAGNOSIS — E785 Hyperlipidemia, unspecified: Secondary | ICD-10-CM | POA: Diagnosis not present

## 2021-09-11 DIAGNOSIS — Z7902 Long term (current) use of antithrombotics/antiplatelets: Secondary | ICD-10-CM | POA: Diagnosis not present

## 2021-09-11 DIAGNOSIS — I2510 Atherosclerotic heart disease of native coronary artery without angina pectoris: Secondary | ICD-10-CM | POA: Diagnosis not present

## 2021-09-11 DIAGNOSIS — Z7982 Long term (current) use of aspirin: Secondary | ICD-10-CM | POA: Diagnosis not present

## 2021-09-11 DIAGNOSIS — I5022 Chronic systolic (congestive) heart failure: Secondary | ICD-10-CM | POA: Diagnosis not present

## 2021-09-11 DIAGNOSIS — I10 Essential (primary) hypertension: Secondary | ICD-10-CM | POA: Insufficient documentation

## 2021-09-11 LAB — LIPID PANEL
Cholesterol: 138 mg/dL (ref 0–200)
HDL: 56 mg/dL (ref >40)
LDL Cholesterol: 55 mg/dL (ref 0–99)
Total CHOL/HDL Ratio: 2.5 RATIO
Triglycerides: 136 mg/dL (ref <150)
VLDL: 27 mg/dL (ref 0–40)

## 2021-09-11 LAB — BASIC METABOLIC PANEL
Anion gap: 8 (ref 5–15)
BUN: 14 mg/dL (ref 8–23)
CO2: 31 mmol/L (ref 22–32)
Calcium: 9.5 mg/dL (ref 8.9–10.3)
Chloride: 99 mmol/L (ref 98–111)
Creatinine, Ser: 0.8 mg/dL (ref 0.61–1.24)
GFR, Estimated: >60 mL/min (ref >60)
Glucose, Bld: 88 mg/dL (ref 70–99)
Potassium: 4 mmol/L (ref 3.5–5.1)
Sodium: 138 mmol/L (ref 135–145)

## 2021-09-11 LAB — CBC
HCT: 50.6 % (ref 39.0–52.0)
Hemoglobin: 17 g/dL (ref 13.0–17.0)
MCH: 31.9 pg (ref 26.0–34.0)
MCHC: 33.6 g/dL (ref 30.0–36.0)
MCV: 94.9 fL (ref 80.0–100.0)
Platelets: 204 10*3/uL (ref 150–400)
RBC: 5.33 MIL/uL (ref 4.22–5.81)
RDW: 13.2 % (ref 11.5–15.5)
WBC: 6 10*3/uL (ref 4.0–10.5)
nRBC: 0 % (ref 0.0–0.2)

## 2021-09-11 NOTE — Patient Instructions (Addendum)
Labs done today. We will contact you only if your labs are abnormal.  STOP taking Asprin.   No other medication changes were made. Please continue all current medications as prescribed.  Your physician has requested that you regularly monitor and record your blood pressure readings at home for 2 weeks. Please use the same machine at the same time of day to check your readings and email them to Dr. Aundra Dubin or send via [place].   Your physician recommends that you schedule a follow-up appointment in: 1 year. Please contact our office in October 2023 to schedule a November 2023 appointment.   If you have any questions or concerns before your next appointment please send [place] a message through [place] or call our office at [phone].    TO LEAVE A MESSAGE FOR THE NURSE SELECT OPTION 2, PLEASE LEAVE A MESSAGE INCLUDING: YOUR NAME DATE OF BIRTH CALL BACK NUMBER REASON FOR CALL**this is important as we prioritize the call backs  YOU WILL RECEIVE A CALL BACK THE SAME DAY AS LONG AS YOU CALL BEFORE 4:00 PM   Do the following things EVERYDAY: Weigh yourself in the morning before breakfast. Write it down and keep it in a log. Take your medicines as prescribed Eat low salt foods--Limit salt (sodium) to 2000 mg per day.  Stay as active as you can everyday Limit all fluids for the day to less than 2 liters   At the [facility], you and your health needs are our priority. As part of our continuing mission to provide you with exceptional heart care, we have created designated Provider Care Teams. These Care Teams include your primary Cardiologist (physician) and Advanced Practice Providers (APPs- Physician Assistants and Nurse Practitioners) who all work together to provide you with the care you need, when you need it.   You may see any of the following providers on your designated Care Team at your next follow up: Dr Glori Bickers Dr Haynes Kerns, NP Lyda Jester, [place] Audry Riles, PharmD   Please be sure to bring in all your medications bottles to every appointment.

## 2021-09-11 NOTE — Progress Notes (Signed)
Patient ID: Jose Meyer, male   DOB: [birth year], 75 y.o.   MRN: [id number] PCP: Dr. Darcus Austin Cardiology: Dr. Aundra Dubin  75 y.o.with history of prior cerebellar CVA, nonobstructive CAD, HTN, hyperlipidemia presents for followup of CAD, HTN, CVA.  Patient has a history of cerebellar CVA in 2000, documented by MRI.  Symptoms at that time were difficulty with balace and discoordination.  No vertigo at that time.  In 7/10, pt had just gotten dressed for church one Sunday and was not doing anything in particular.  He developed a severe spinning sensation like vertigo that lasted for about 5 minutes.  He was "woozy" for about 1.5 hours after that before finally returning to normal.  He does not remember having the vertigo set off by moving his head in a particular direction.  He went to the ER and had an MRI of his head done, showing only small vessel disease.  There was some concern that this could have been a posterior circulation TIA given prior history of cerebellar CVA.  He did have a TEE in 9/10 showing no PFO, normal LV and RV systolic function, grade III plaque in the aortic arch/descending thoracic aorta.  Patient has known nonobstructive CAD from cath in 2007.  Last echo in 9/13 showed normal EF. Cardiolite was done in 2/18, no ischemia (low risk study).    Patient has been stable symptomatically over the last year.  However, his weight is back up about 19 lbs.  He has gone back on a diet and weight is coming down again.  He has not been exercising as much over the last year.  No significant exertional chest pain or dyspnea.  He notes fatigue pushing his trash can up the driveway (hill). BP is mildly elevated today.   Labs (8/10): creatinine 1.0, LDL 73 Labs (9/12): K 4.1, creatinine 0.83, LDL 53, HDL 42, TGs 131 Labs (9/13): K 4.1, creatinine 0.9, TSH normal Labs (12/15): LDL 62, HDL 51, LFTs normal, K 4, creatinine 0.98 Labs (12/17): K 3.6, creatinine 0.96, hgb 17.1, LDL 53, HDL 67 Labs (1/19):  LDL 54, HDL 50 Labs (7/19): K 4.2, creatinine 0.88 Labs (9/19): LDL 59, HDL 47 Labs (9/20): LDL 53, TGs 150, HDL 50, K 3.7, creatinine 0.93 Labs (10/21): LDL 44, TGs 172, hgb 16.8, K 3.9, creatinine 1.04  ECG (personally reviewed): NSR, normal  Allergies (verified):  No Known Drug Allergies  Past Medical History: 1. Hyperlipidemia 2. Cerebellar CVA 2000: documented by MRI.  Possible TIA 7/10 with vertigo.  MRI 7/10 showed only small vessel disease.  Possible TIA 7/10.  Carotid dopplers (9/10) with no significant disease.  TEE (9/10): Normal LV and RV size and systolic function.  No PFO.  No significant valvular disease.  At least grade III plaque in the arch and descending thoracic aorta with no ulceration or mobility.  3. HTN 4. History of colon CA s/p R hemicolectomy in 9/09.  5. psoriasis 6. diverticulosis 7. CAD: nonobstructive.  LHC in 2000 without significant disease.  ETT-cardiolite 2007 with fixed inferior defect.  LHC (4/07) showed EF 60%, 30-40% CFX stenosis, 30-40% mRCA stenosis.  Probably false positive cardiolite.  Echo (9/13): EF 55-60%, mild LVH, grade I diastolic dysfunction, mild to moderate LAE. - Cardiolite (2/18): Medium-sized, fixed inferolateral perfusion defect (likely artifact given normal wall motion), no ischemia, EF 64%.  8. Palpitations: PVCs.  30 day event monitor in 9/13 with occasional PVCs but no more significant arrhythmias.   9. Chronic low back  pain.  10. Plantar fasciitis 11. Psoriasis 12. Basal cell carcinoma  Family History: No premature CAD.   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 Lives in [place].  No smoking since 1975.  Occasional ETOH.  Married with 2 children [facility], cardiologist in [place]).  Enjoys woodworking.  ROS: All systems reviewed and negative except as per HPI.   Current Outpatient Medications  Medication Sig Dispense Refill   amLODipine (NORVASC) 5 MG tablet  Take 1 tablet by mouth daily.     atorvastatin (LIPITOR) 10 MG tablet Take 1 tablet by mouth daily.     betamethasone dipropionate 0.05 % lotion Apply 8.14 application topically as needed. For the scalp after shower  0   clopidogrel (PLAVIX) 75 MG tablet Take 1 tablet by mouth daily.     clotrimazole (LOTRIMIN) 1 % cream APPLY TO BOTH FEET AND BETWEEN TOES TWICE DAILY 30 g 1   diclofenac Sodium (VOLTAREN) 1 % GEL 1 application     ELIDEL 1 % cream Apply 1 application topically as needed. For skin  0   fluorometholone (FML) 0.1 % ophthalmic suspension INT 1 GTT INTO OU UTD PRN  4   fluticasone (FLONASE) 50 MCG/ACT nasal spray 1 spray in each nostril     furosemide (LASIX) 20 MG tablet Take 20 mg by mouth daily as needed.     hydrochlorothiazide (HYDRODIURIL) 25 MG tablet 1 tablet in the morning     ketoconazole (NIZORAL) 2 % cream      losartan (COZAAR) 50 MG tablet Take 1 tablet by mouth daily.     methocarbamol (ROBAXIN) 500 MG tablet TK 1 T PO Q 6 H PRF BACK SPASMS     metoprolol (TOPROL-XL) 100 MG 24 hr tablet Take 50-100 mg by mouth as directed. TAKE 50 MG IN THE AM AND TAKE 100 MG IN THE PM     Multiple Vitamins-Minerals (MULTI FOR HIM 50+) TABS      SYSTANE ULTRA 0.4-0.3 % SOLN      triamcinolone ointment (KENALOG) 0.1 %      triamcinolone ointment (KENALOG) 0.5 %      No current facility-administered medications for this encounter.    BP 132/78   Pulse 72   Wt 105.2 kg (232 lb)   SpO2 95%   BMI 31.46 kg/m  General: NAD Neck: No JVD, no thyromegaly or thyroid nodule.  Lungs: Clear to auscultation bilaterally with normal respiratory effort. CV: Nondisplaced PMI.  Heart regular S1/S2, no S3/S4, 1/6 early SEM RUSB with clear S2.  1+ ankle edema.  No carotid bruit.  Normal pedal pulses.  Abdomen: Soft, nontender, no hepatosplenomegaly, no distention.  Skin: Intact without lesions or rashes.  Neurologic: Alert and oriented x 3.  Psych: Normal affect. Extremities: No clubbing or  cyanosis.  HEENT: Normal.   Assessment/Plan:  1. CAD: Nonobstructive CAD on last cath in 2007. Cardiolite in 2/18 with no ischemia.  No further chest pain episodes. Good exercise tolerance.  - Continue Plavix, ARB, Toprol XL, and statin.   - I think he can stop ASA (does not need to take both ASA and Plavix). 2. HYPERLIPIDEMIA: Check lipids today, goal LDL < 70.  3. HYPERTENSION: BP mildly elevated today.  - Continue current meds, but he will check BP daily after meds x 2 wks and send me in the readings.  4. CVA: H/o CVA/TIA in the past. He will continue on Plavix. No recent neurological symptoms.  Followup 1 year    Loralie Champagne 09/11/2021

## 2021-10-23 DIAGNOSIS — Z86018 Personal history of other benign neoplasm: Secondary | ICD-10-CM | POA: Diagnosis not present

## 2021-10-23 DIAGNOSIS — D225 Melanocytic nevi of trunk: Secondary | ICD-10-CM | POA: Diagnosis not present

## 2021-10-23 DIAGNOSIS — C44719 Basal cell carcinoma of skin of left lower limb, including hip: Secondary | ICD-10-CM | POA: Diagnosis not present

## 2021-10-23 DIAGNOSIS — L821 Other seborrheic keratosis: Secondary | ICD-10-CM | POA: Diagnosis not present

## 2021-10-23 DIAGNOSIS — D2272 Melanocytic nevi of left lower limb, including hip: Secondary | ICD-10-CM | POA: Diagnosis not present

## 2021-10-23 DIAGNOSIS — D485 Neoplasm of uncertain behavior of skin: Secondary | ICD-10-CM | POA: Diagnosis not present

## 2021-10-23 DIAGNOSIS — L578 Other skin changes due to chronic exposure to nonionizing radiation: Secondary | ICD-10-CM | POA: Diagnosis not present

## 2021-10-23 DIAGNOSIS — D2271 Melanocytic nevi of right lower limb, including hip: Secondary | ICD-10-CM | POA: Diagnosis not present

## 2021-10-23 DIAGNOSIS — Z85828 Personal history of other malignant neoplasm of skin: Secondary | ICD-10-CM | POA: Diagnosis not present

## 2021-10-23 DIAGNOSIS — L409 Psoriasis, unspecified: Secondary | ICD-10-CM | POA: Diagnosis not present

## 2021-11-15 DIAGNOSIS — R1030 Lower abdominal pain, unspecified: Secondary | ICD-10-CM | POA: Diagnosis not present

## 2021-11-15 DIAGNOSIS — K529 Noninfective gastroenteritis and colitis, unspecified: Secondary | ICD-10-CM | POA: Diagnosis not present

## 2021-11-15 DIAGNOSIS — Z03818 Encounter for observation for suspected exposure to other biological agents ruled out: Secondary | ICD-10-CM | POA: Diagnosis not present

## 2021-11-15 DIAGNOSIS — R6883 Chills (without fever): Secondary | ICD-10-CM | POA: Diagnosis not present

## 2021-11-21 DIAGNOSIS — C44719 Basal cell carcinoma of skin of left lower limb, including hip: Secondary | ICD-10-CM | POA: Diagnosis not present

## 2021-12-05 ENCOUNTER — Other Ambulatory Visit: Payer: Self-pay

## 2021-12-05 ENCOUNTER — Ambulatory Visit: Payer: Medicare PPO | Admitting: Podiatry

## 2021-12-05 ENCOUNTER — Encounter: Payer: Self-pay | Admitting: Podiatry

## 2021-12-05 DIAGNOSIS — L84 Corns and callosities: Secondary | ICD-10-CM

## 2021-12-05 DIAGNOSIS — D689 Coagulation defect, unspecified: Secondary | ICD-10-CM

## 2021-12-05 DIAGNOSIS — M79675 Pain in left toe(s): Secondary | ICD-10-CM

## 2021-12-05 DIAGNOSIS — M79674 Pain in right toe(s): Secondary | ICD-10-CM

## 2021-12-05 DIAGNOSIS — B351 Tinea unguium: Secondary | ICD-10-CM

## 2021-12-11 NOTE — Progress Notes (Signed)
Subjective: Jose Meyer is a 76 y.o. male patient seen today for follow up of  at risk foot care with h/o clotting disorder and callus(es) bilateral great toes and painful thick toenails that are difficult to trim. Painful toenails interfere with ambulation. Aggravating factors include wearing enclosed shoe gear. Pain is relieved with periodic professional debridement. Painful calluses are aggravated when weightbearing with and without shoegear. Pain is relieved with periodic professional debridement..   New problem(s)/concern(s) today: None    He states he had a recent biopsy of lesion from his left lower leg and it was confirmed skin caner.  PCP is London Pepper, MD. Last visit was: September, 2022.  No Known Allergies  Objective: Physical Exam  General: Patient is a pleasant 76 y.o. Caucasian male WD, WN in NAD. AAO x 3.   Neurovascular Examination: Capillary refill time to digits immediate b/l. Palpable pedal pulses b/l LE. Pedal hair absent. No pain with calf compression b/l. Lower extremity skin temperature gradient within normal limits. Trace edema noted BLE. No cyanosis or clubbing noted b/l LE.  Protective sensation intact 5/5 intact bilaterally with 10g monofilament b/l. Vibratory sensation intact b/l.  Dermatological:  Pedal integument with normal turgor, texture and tone BLE. No open wounds b/l LE. No interdigital macerations noted b/l LE. Toenails 1-5 b/l elongated, discolored, dystrophic, thickened, crumbly with subungual debris and tenderness to dorsal palpation. Hyperkeratotic lesion(s) bilateral great toes.  No erythema, no edema, no drainage, no fluctuance.  Musculoskeletal:  Muscle strength 5/5 to all lower extremity muscle groups bilaterally. No pain, crepitus or joint limitation noted with ROM bilateral LE. No gross bony deformities bilaterally. Patient ambulates independent of any assistive aids.  Assessment: 1. Pain due to onychomycosis of toenails of both  feet   2. Callus   3. Clotting disorder [facility])    Plan: Patient was evaluated and treated and all questions answered. Consent given for treatment as described below: -Mycotic toenails 1-5 bilaterally were debrided in length and girth with sterile nail nippers and dremel without incident. -Callus(es) bilateral great toes pared utilizing sterile scalpel blade without complication or incident. Total number debrided =2. -Patient/POA to call should there be question/concern in the interim.  Return in about 3 months (around 03/04/2022).  Marzetta Board, DPM

## 2021-12-26 DIAGNOSIS — R2689 Other abnormalities of gait and mobility: Secondary | ICD-10-CM | POA: Diagnosis not present

## 2021-12-26 DIAGNOSIS — I10 Essential (primary) hypertension: Secondary | ICD-10-CM | POA: Diagnosis not present

## 2021-12-26 DIAGNOSIS — E7800 Pure hypercholesterolemia, unspecified: Secondary | ICD-10-CM | POA: Diagnosis not present

## 2021-12-26 DIAGNOSIS — R609 Edema, unspecified: Secondary | ICD-10-CM | POA: Diagnosis not present

## 2021-12-26 DIAGNOSIS — Z0000 Encounter for general adult medical examination without abnormal findings: Secondary | ICD-10-CM | POA: Diagnosis not present

## 2021-12-26 DIAGNOSIS — I25119 Atherosclerotic heart disease of native coronary artery with unspecified angina pectoris: Secondary | ICD-10-CM | POA: Diagnosis not present

## 2021-12-26 DIAGNOSIS — R7309 Other abnormal glucose: Secondary | ICD-10-CM | POA: Diagnosis not present

## 2022-01-02 DIAGNOSIS — R2681 Unsteadiness on feet: Secondary | ICD-10-CM | POA: Diagnosis not present

## 2022-01-02 DIAGNOSIS — M6281 Muscle weakness (generalized): Secondary | ICD-10-CM | POA: Diagnosis not present

## 2022-01-10 DIAGNOSIS — R2681 Unsteadiness on feet: Secondary | ICD-10-CM | POA: Diagnosis not present

## 2022-01-10 DIAGNOSIS — M6281 Muscle weakness (generalized): Secondary | ICD-10-CM | POA: Diagnosis not present

## 2022-01-14 ENCOUNTER — Other Ambulatory Visit (HOSPITAL_COMMUNITY): Payer: Self-pay | Admitting: Cardiology

## 2022-01-17 DIAGNOSIS — R2681 Unsteadiness on feet: Secondary | ICD-10-CM | POA: Diagnosis not present

## 2022-01-17 DIAGNOSIS — M6281 Muscle weakness (generalized): Secondary | ICD-10-CM | POA: Diagnosis not present

## 2022-01-24 DIAGNOSIS — M6281 Muscle weakness (generalized): Secondary | ICD-10-CM | POA: Diagnosis not present

## 2022-01-24 DIAGNOSIS — R2681 Unsteadiness on feet: Secondary | ICD-10-CM | POA: Diagnosis not present

## 2022-01-31 DIAGNOSIS — M6281 Muscle weakness (generalized): Secondary | ICD-10-CM | POA: Diagnosis not present

## 2022-01-31 DIAGNOSIS — R2681 Unsteadiness on feet: Secondary | ICD-10-CM | POA: Diagnosis not present

## 2022-02-20 DIAGNOSIS — M1712 Unilateral primary osteoarthritis, left knee: Secondary | ICD-10-CM | POA: Diagnosis not present

## 2022-02-20 DIAGNOSIS — M25562 Pain in left knee: Secondary | ICD-10-CM | POA: Diagnosis not present

## 2022-02-20 DIAGNOSIS — M25561 Pain in right knee: Secondary | ICD-10-CM | POA: Diagnosis not present

## 2022-02-28 DIAGNOSIS — J3489 Other specified disorders of nose and nasal sinuses: Secondary | ICD-10-CM | POA: Insufficient documentation

## 2022-03-06 ENCOUNTER — Ambulatory Visit: Payer: Medicare PPO | Admitting: Podiatry

## 2022-03-06 ENCOUNTER — Encounter: Payer: Self-pay | Admitting: Podiatry

## 2022-03-06 DIAGNOSIS — L84 Corns and callosities: Secondary | ICD-10-CM | POA: Diagnosis not present

## 2022-03-06 DIAGNOSIS — D689 Coagulation defect, unspecified: Secondary | ICD-10-CM

## 2022-03-06 DIAGNOSIS — B351 Tinea unguium: Secondary | ICD-10-CM

## 2022-03-06 DIAGNOSIS — Z860100 Personal history of colon polyps, unspecified: Secondary | ICD-10-CM | POA: Insufficient documentation

## 2022-03-06 DIAGNOSIS — M79675 Pain in left toe(s): Secondary | ICD-10-CM

## 2022-03-06 DIAGNOSIS — M79674 Pain in right toe(s): Secondary | ICD-10-CM | POA: Diagnosis not present

## 2022-03-11 NOTE — Progress Notes (Signed)
  Subjective:  Patient ID: Jose Meyer, male    DOB: December 29, 1945,  MRN: [id number]  Jose Meyer presents to clinic today for at risk foot care with h/o clotting disorder and callus(es) b/l lower extremities and painful thick toenails that are difficult to trim. Painful toenails interfere with ambulation. Aggravating factors include wearing enclosed shoe gear. Pain is relieved with periodic professional debridement. Painful calluses are aggravated when weightbearing with and without shoegear. Pain is relieved with periodic professional debridement.  New problem(s): None.   PCP is London Pepper, MD , and last visit was December 26, 2021.  No Known Allergies  Review of Systems: Negative except as noted in the HPI.  Objective: No changes noted in today's physical examination. General: Patient is a pleasant 76 y.o. Caucasian male WD, WN in NAD. AAO x 3.   Neurovascular Examination: Capillary refill time to digits immediate b/l. Palpable pedal pulses b/l LE. Pedal hair absent. No pain with calf compression b/l. Lower extremity skin temperature gradient within normal limits. Trace edema noted BLE. No cyanosis or clubbing noted b/l LE.  Protective sensation intact 5/5 intact bilaterally with 10g monofilament b/l. Vibratory sensation intact b/l.  Dermatological:  Pedal integument with normal turgor, texture and tone BLE. No open wounds b/l LE. No interdigital macerations noted b/l LE. Toenails 1-5 b/l elongated, discolored, dystrophic, thickened, crumbly with subungual debris and tenderness to dorsal palpation. Hyperkeratotic lesion(s) bilateral great toes.  No erythema, no edema, no drainage, no fluctuance.  Musculoskeletal:  Muscle strength 5/5 to all lower extremity muscle groups bilaterally. No pain, crepitus or joint limitation noted with ROM bilateral LE. No gross bony deformities bilaterally. Patient ambulates independent of any assistive aids.  Assessment/Plan: 1. Pain due to  onychomycosis of toenails of both feet   2. Callus   3. Clotting disorder ([place])     -Examined patient. -No new findings. No new orders. -Patient to continue soft, supportive shoe gear daily. -Toenails 1-5 b/l were debrided in length and girth with sterile nail nippers and dremel without iatrogenic bleeding.  -Callus(es) bilateral great toes pared utilizing sterile scalpel blade without complication or incident. Total number debrided =2. -Patient/POA to call should there be question/concern in the interim.   Return in about 3 months (around 06/06/2022).  Marzetta Board, DPM

## 2022-03-25 DIAGNOSIS — M1712 Unilateral primary osteoarthritis, left knee: Secondary | ICD-10-CM | POA: Diagnosis not present

## 2022-04-03 DIAGNOSIS — M1712 Unilateral primary osteoarthritis, left knee: Secondary | ICD-10-CM | POA: Diagnosis not present

## 2022-04-10 DIAGNOSIS — M1712 Unilateral primary osteoarthritis, left knee: Secondary | ICD-10-CM | POA: Diagnosis not present

## 2022-04-17 DIAGNOSIS — M1712 Unilateral primary osteoarthritis, left knee: Secondary | ICD-10-CM | POA: Diagnosis not present

## 2022-05-09 DIAGNOSIS — H04123 Dry eye syndrome of bilateral lacrimal glands: Secondary | ICD-10-CM | POA: Diagnosis not present

## 2022-05-09 DIAGNOSIS — H0288A Meibomian gland dysfunction right eye, upper and lower eyelids: Secondary | ICD-10-CM | POA: Diagnosis not present

## 2022-05-17 DIAGNOSIS — M25562 Pain in left knee: Secondary | ICD-10-CM | POA: Diagnosis not present

## 2022-05-17 DIAGNOSIS — M1712 Unilateral primary osteoarthritis, left knee: Secondary | ICD-10-CM | POA: Diagnosis not present

## 2022-05-22 DIAGNOSIS — M25562 Pain in left knee: Secondary | ICD-10-CM | POA: Diagnosis not present

## 2022-05-28 DIAGNOSIS — Z85828 Personal history of other malignant neoplasm of skin: Secondary | ICD-10-CM | POA: Diagnosis not present

## 2022-05-28 DIAGNOSIS — L304 Erythema intertrigo: Secondary | ICD-10-CM | POA: Diagnosis not present

## 2022-05-28 DIAGNOSIS — L570 Actinic keratosis: Secondary | ICD-10-CM | POA: Diagnosis not present

## 2022-05-28 DIAGNOSIS — D2271 Melanocytic nevi of right lower limb, including hip: Secondary | ICD-10-CM | POA: Diagnosis not present

## 2022-05-28 DIAGNOSIS — Z86018 Personal history of other benign neoplasm: Secondary | ICD-10-CM | POA: Diagnosis not present

## 2022-05-28 DIAGNOSIS — L821 Other seborrheic keratosis: Secondary | ICD-10-CM | POA: Diagnosis not present

## 2022-05-28 DIAGNOSIS — D225 Melanocytic nevi of trunk: Secondary | ICD-10-CM | POA: Diagnosis not present

## 2022-05-28 DIAGNOSIS — L578 Other skin changes due to chronic exposure to nonionizing radiation: Secondary | ICD-10-CM | POA: Diagnosis not present

## 2022-05-28 DIAGNOSIS — D2272 Melanocytic nevi of left lower limb, including hip: Secondary | ICD-10-CM | POA: Diagnosis not present

## 2022-05-29 DIAGNOSIS — M25562 Pain in left knee: Secondary | ICD-10-CM | POA: Diagnosis not present

## 2022-05-29 DIAGNOSIS — M1712 Unilateral primary osteoarthritis, left knee: Secondary | ICD-10-CM | POA: Diagnosis not present

## 2022-06-11 ENCOUNTER — Ambulatory Visit: Payer: Medicare PPO | Admitting: Podiatry

## 2022-06-11 DIAGNOSIS — R300 Dysuria: Secondary | ICD-10-CM | POA: Diagnosis not present

## 2022-06-11 DIAGNOSIS — N390 Urinary tract infection, site not specified: Secondary | ICD-10-CM | POA: Diagnosis not present

## 2022-06-26 DIAGNOSIS — H43813 Vitreous degeneration, bilateral: Secondary | ICD-10-CM | POA: Diagnosis not present

## 2022-06-26 DIAGNOSIS — H25813 Combined forms of age-related cataract, bilateral: Secondary | ICD-10-CM | POA: Diagnosis not present

## 2022-06-26 DIAGNOSIS — H04123 Dry eye syndrome of bilateral lacrimal glands: Secondary | ICD-10-CM | POA: Diagnosis not present

## 2022-06-26 DIAGNOSIS — H35373 Puckering of macula, bilateral: Secondary | ICD-10-CM | POA: Diagnosis not present

## 2022-06-28 ENCOUNTER — Encounter: Payer: Self-pay | Admitting: Podiatry

## 2022-06-28 ENCOUNTER — Telehealth: Payer: Self-pay | Admitting: Podiatry

## 2022-06-28 ENCOUNTER — Ambulatory Visit: Payer: Medicare PPO | Admitting: Podiatry

## 2022-06-28 DIAGNOSIS — M79675 Pain in left toe(s): Secondary | ICD-10-CM

## 2022-06-28 DIAGNOSIS — B351 Tinea unguium: Secondary | ICD-10-CM

## 2022-06-28 DIAGNOSIS — M79674 Pain in right toe(s): Secondary | ICD-10-CM

## 2022-06-28 DIAGNOSIS — D689 Coagulation defect, unspecified: Secondary | ICD-10-CM

## 2022-06-28 NOTE — Progress Notes (Signed)
This patient returns to my office for at risk foot care.  This patient requires this care by a professional since this patient will be at risk due to having coagulation defect due to plavix.  This patient is unable to cut nails himself since the patient cannot reach his nails.These nails are painful walking and wearing shoes.  This patient presents for at risk foot care today. ° °General Appearance  Alert, conversant and in no acute stress. ° °Vascular  Dorsalis pedis and posterior tibial  pulses are palpable  bilaterally.  Capillary return is within normal limits  bilaterally. Temperature is within normal limits  bilaterally. ° °Neurologic  Senn-Weinstein monofilament wire test within normal limits  bilaterally. Muscle power within normal limits bilaterally. ° °Nails Thick disfigured discolored nails with subungual debris  from hallux to fifth toes bilaterally. No evidence of bacterial infection or drainage bilaterally. ° °Orthopedic  No limitations of motion  feet .  No crepitus or effusions noted.  No bony pathology or digital deformities noted. ° °Skin  normotropic skin with no porokeratosis noted bilaterally.  No signs of infections or ulcers noted.    ° °Onychomycosis  Pain in right toes  Pain in left toes ° °Consent was obtained for treatment procedures.   Mechanical debridement of nails 1-5  bilaterally performed with a nail nipper.  Filed with dremel without incident.   ° ° °Return office visit    3 months                 Told patient to return for periodic foot care and evaluation due to potential at risk complications. ° ° °Tamre Cass DPM  °

## 2022-06-28 NOTE — Telephone Encounter (Signed)
Patient called he is upset on how it nails were cut with Dr Prudence Davidson, he wants put back on the schedule with Dr Elisha Ponder asap to have the problem with his toes rectified.  The way his nails were cut is making his toes sensitive and he feels that he is going to get and ingrown nail because of this.   I tried to explain to him that if we put him back on the schedule earlier than his next appt, his insurance would not cover it.  He would like a call back asap to have this problem handled.  Please advise

## 2022-06-29 ENCOUNTER — Encounter: Payer: Self-pay | Admitting: Podiatry

## 2022-06-29 ENCOUNTER — Ambulatory Visit (INDEPENDENT_AMBULATORY_CARE_PROVIDER_SITE_OTHER): Payer: Medicare PPO | Admitting: Podiatry

## 2022-06-29 DIAGNOSIS — L578 Other skin changes due to chronic exposure to nonionizing radiation: Secondary | ICD-10-CM | POA: Insufficient documentation

## 2022-06-29 DIAGNOSIS — D689 Coagulation defect, unspecified: Secondary | ICD-10-CM

## 2022-06-29 DIAGNOSIS — L570 Actinic keratosis: Secondary | ICD-10-CM | POA: Insufficient documentation

## 2022-06-29 DIAGNOSIS — B351 Tinea unguium: Secondary | ICD-10-CM

## 2022-06-29 DIAGNOSIS — M79675 Pain in left toe(s): Secondary | ICD-10-CM

## 2022-06-29 DIAGNOSIS — M79674 Pain in right toe(s): Secondary | ICD-10-CM

## 2022-07-01 DIAGNOSIS — E7800 Pure hypercholesterolemia, unspecified: Secondary | ICD-10-CM | POA: Diagnosis not present

## 2022-07-01 DIAGNOSIS — I25119 Atherosclerotic heart disease of native coronary artery with unspecified angina pectoris: Secondary | ICD-10-CM | POA: Diagnosis not present

## 2022-07-01 DIAGNOSIS — R7303 Prediabetes: Secondary | ICD-10-CM | POA: Diagnosis not present

## 2022-07-01 DIAGNOSIS — I10 Essential (primary) hypertension: Secondary | ICD-10-CM | POA: Diagnosis not present

## 2022-07-06 NOTE — Progress Notes (Signed)
  Subjective:  Patient ID: Jose Meyer, male    DOB: 27-Dec-1945,  MRN: [id number]  Kaladin Noseworthy Brophy presents to clinic today for at risk foot care with h/o clotting disorder.  Patient was seen earlier this week with another provider. He had toenails debrided, but feels the borders of the great toes were not cleaned out and he is afraid of getting an ingrown toenail.  PCP is London Pepper, MD , and last visit was  December 26, 2021.  No Known Allergies  Review of Systems: Negative except as noted in the HPI.  Objective: No changes noted in today's physical examination. [name] is a pleasant 76 y.o. male in NAD. AAO x 3. General: Patient is a pleasant 76 y.o. Caucasian male WD, WN in NAD. AAO x 3.   Neurovascular Examination: Capillary refill time to digits immediate b/l. Palpable pedal pulses b/l LE. Pedal hair absent. No pain with calf compression b/l. Lower extremity skin temperature gradient within normal limits. Trace edema noted BLE. No cyanosis or clubbing noted b/l LE.  Protective sensation intact 5/5 intact bilaterally with 10g monofilament b/l. Vibratory sensation intact b/l.  Dermatological:  Pedal integument with normal turgor, texture and tone BLE. No open wounds b/l LE. No interdigital macerations noted b/l LE. Toenails 1-5 b/l elongated, discolored, dystrophic, thickened, crumbly with subungual debris and tenderness to dorsal palpation. Hyperkeratotic lesion(s) bilateral great toes.  No erythema, no edema, no drainage, no fluctuance.  Musculoskeletal:  Muscle strength 5/5 to all lower extremity muscle groups bilaterally. No pain, crepitus or joint limitation noted with ROM bilateral LE. No gross bony deformities bilaterally. Patient ambulates independent of any assistive aids. Assessment/Plan: 1. Pain due to onychomycosis of toenails of both feet   2. Clotting disorder [facility])     -Patient was evaluated and treated. All patient's and/or POA's questions/concerns  answered on today's visit. -Toenail debridement as a courtesy on today's visit. -Patient to continue soft, supportive shoe gear daily. -Patient/POA to call should there be question/concern in the interim.   Return in about 3 months (around 09/28/2022).  Marzetta Board, DPM

## 2022-07-10 DIAGNOSIS — U071 COVID-19: Secondary | ICD-10-CM | POA: Diagnosis not present

## 2022-07-10 DIAGNOSIS — Z8701 Personal history of pneumonia (recurrent): Secondary | ICD-10-CM | POA: Diagnosis not present

## 2022-07-10 DIAGNOSIS — R54 Age-related physical debility: Secondary | ICD-10-CM | POA: Diagnosis not present

## 2022-07-29 ENCOUNTER — Ambulatory Visit (INDEPENDENT_AMBULATORY_CARE_PROVIDER_SITE_OTHER): Payer: Medicare PPO

## 2022-07-29 ENCOUNTER — Ambulatory Visit: Payer: Medicare PPO | Admitting: Podiatry

## 2022-07-29 DIAGNOSIS — M25872 Other specified joint disorders, left ankle and foot: Secondary | ICD-10-CM

## 2022-07-29 NOTE — Progress Notes (Signed)
   Chief Complaint  Patient presents with   Foot Pain    Patient is here for left foot pain in the bottom of foot, the patient states that he started feeling the knot on this past thursday    HPI: 76 y.o. male presenting today for evaluation of pain and tenderness associated to the plantar aspect of the left great toe that began towards the end of last week.  Idiopathic onset.  Over the weekend he says that the pain eventually resolved.  Yesterday he had no pain or tenderness associated to the area.  He notes pain to the plantar aspect of the first MTP.  He states that he would just like to have it evaluated today however.  Past Medical History:  Diagnosis Date   Colon cancer [facility])    s/p R hemicolectomy 06/2008   Coronary artery disease    Non-Obstructive;  LHC 2000 no sig dz; ETT-Cardiolite 2007 with fixed Inf defect;  LHC 4/07:  EF 60%, CFX 30-40%, mRCA 30-40% (prob false + CLite)   CVA (cerebral infarction)    cerebellar CVA 2000: doc by MRI; Poss TIA 7/10 with vertigo; MRI 7/10 => only small vessel disease; Carotids 9/10: no sig disease;  TEE 9/10: normal LV and RV size and systolic fxn, no PFO, no sig valvular dz, at least Gr III plaque in arch and desc thoracic aorta (no ulceration or mobility)   Diverticulosis    H/O echocardiogram    Echo 9/13: EF 55-60%, mod LVH, grade 1 diast dysfn, mild to mod LAE   Hyperlipidemia    Hypertension    Psoriasis     Past Surgical History:  Procedure Laterality Date   KNEE SURGERY Left 07/2014    No Known Allergies   Physical Exam: General: The patient is alert and oriented x3 in no acute distress.  Dermatology: Skin is warm, dry and supple bilateral lower extremities. Negative for open lesions or macerations.  Vascular: Palpable pedal pulses bilaterally. Capillary refill within normal limits.  Negative for any significant edema or erythema  Neurological: Light touch and protective threshold grossly intact  Musculoskeletal Exam: No  pedal deformities noted.  No pain with palpation throughout the first MTP  Radiographic Exam:  Normal osseous mineralization. Joint spaces preserved. No fracture/dislocation/boney destruction.  Bipartite tibial sesamoid noted  Assessment: 1.  Sesamoiditis first MTP left; resolved   Plan of Care:  1. Patient evaluated. X-Rays reviewed.  2.  Recommend good supportive shoes and sneakers with good arch supports to offload pressure from the forefoot and first MTP 3.  Advised against going barefoot 4.  Return to clinic next scheduled appointment with Dr. Adah Perl for routine foot care      Edrick Kins, DPM [facility]  Dr. Edrick Kins, DPM    [address], [place] [address]                Office [phone]  Fax [phone]

## 2022-08-30 DIAGNOSIS — E782 Mixed hyperlipidemia: Secondary | ICD-10-CM | POA: Diagnosis not present

## 2022-08-30 DIAGNOSIS — G8929 Other chronic pain: Secondary | ICD-10-CM | POA: Diagnosis not present

## 2022-08-30 DIAGNOSIS — I10 Essential (primary) hypertension: Secondary | ICD-10-CM | POA: Diagnosis not present

## 2022-08-30 DIAGNOSIS — Z743 Need for continuous supervision: Secondary | ICD-10-CM | POA: Diagnosis not present

## 2022-08-30 DIAGNOSIS — I959 Hypotension, unspecified: Secondary | ICD-10-CM | POA: Diagnosis not present

## 2022-08-30 DIAGNOSIS — I213 ST elevation (STEMI) myocardial infarction of unspecified site: Secondary | ICD-10-CM | POA: Diagnosis not present

## 2022-08-30 DIAGNOSIS — I2111 ST elevation (STEMI) myocardial infarction involving right coronary artery: Secondary | ICD-10-CM | POA: Diagnosis not present

## 2022-08-30 DIAGNOSIS — J90 Pleural effusion, not elsewhere classified: Secondary | ICD-10-CM | POA: Diagnosis not present

## 2022-08-30 DIAGNOSIS — J9601 Acute respiratory failure with hypoxia: Secondary | ICD-10-CM | POA: Diagnosis not present

## 2022-08-30 DIAGNOSIS — J9811 Atelectasis: Secondary | ICD-10-CM | POA: Diagnosis not present

## 2022-08-30 DIAGNOSIS — K3189 Other diseases of stomach and duodenum: Secondary | ICD-10-CM | POA: Diagnosis not present

## 2022-08-30 DIAGNOSIS — R0902 Hypoxemia: Secondary | ICD-10-CM | POA: Diagnosis not present

## 2022-08-30 DIAGNOSIS — J984 Other disorders of lung: Secondary | ICD-10-CM | POA: Diagnosis not present

## 2022-08-30 DIAGNOSIS — I219 Acute myocardial infarction, unspecified: Secondary | ICD-10-CM | POA: Diagnosis not present

## 2022-08-30 DIAGNOSIS — I499 Cardiac arrhythmia, unspecified: Secondary | ICD-10-CM | POA: Diagnosis not present

## 2022-08-30 DIAGNOSIS — M549 Dorsalgia, unspecified: Secondary | ICD-10-CM | POA: Diagnosis not present

## 2022-08-30 DIAGNOSIS — I2119 ST elevation (STEMI) myocardial infarction involving other coronary artery of inferior wall: Secondary | ICD-10-CM | POA: Diagnosis not present

## 2022-08-30 DIAGNOSIS — I2510 Atherosclerotic heart disease of native coronary artery without angina pectoris: Secondary | ICD-10-CM | POA: Diagnosis not present

## 2022-09-02 DIAGNOSIS — R0902 Hypoxemia: Secondary | ICD-10-CM | POA: Insufficient documentation

## 2022-09-03 DIAGNOSIS — J90 Pleural effusion, not elsewhere classified: Secondary | ICD-10-CM | POA: Diagnosis not present

## 2022-09-03 DIAGNOSIS — J9811 Atelectasis: Secondary | ICD-10-CM | POA: Diagnosis not present

## 2022-09-10 ENCOUNTER — Ambulatory Visit (HOSPITAL_COMMUNITY)
Admission: RE | Admit: 2022-09-10 | Discharge: 2022-09-10 | Disposition: A | Discharge status: Home / Self Care | Payer: Medicare PPO | Source: Ambulatory Visit | Attending: Cardiology | Admitting: Cardiology

## 2022-09-10 ENCOUNTER — Encounter (HOSPITAL_COMMUNITY): Payer: Self-pay | Admitting: Cardiology

## 2022-09-10 ENCOUNTER — Other Ambulatory Visit (HOSPITAL_COMMUNITY): Payer: Self-pay | Admitting: *Deleted

## 2022-09-10 VITALS — BP 80/50 | HR 88 | Wt 230.8 lb

## 2022-09-10 DIAGNOSIS — I2510 Atherosclerotic heart disease of native coronary artery without angina pectoris: Secondary | ICD-10-CM

## 2022-09-10 DIAGNOSIS — E785 Hyperlipidemia, unspecified: Secondary | ICD-10-CM | POA: Insufficient documentation

## 2022-09-10 DIAGNOSIS — Z79899 Other long term (current) drug therapy: Secondary | ICD-10-CM | POA: Insufficient documentation

## 2022-09-10 DIAGNOSIS — I5022 Chronic systolic (congestive) heart failure: Secondary | ICD-10-CM

## 2022-09-10 DIAGNOSIS — Z7902 Long term (current) use of antithrombotics/antiplatelets: Secondary | ICD-10-CM | POA: Insufficient documentation

## 2022-09-10 DIAGNOSIS — R0600 Dyspnea, unspecified: Secondary | ICD-10-CM | POA: Insufficient documentation

## 2022-09-10 DIAGNOSIS — Z8673 Personal history of transient ischemic attack (TIA), and cerebral infarction without residual deficits: Secondary | ICD-10-CM | POA: Diagnosis not present

## 2022-09-10 DIAGNOSIS — I10 Essential (primary) hypertension: Secondary | ICD-10-CM | POA: Diagnosis not present

## 2022-09-10 DIAGNOSIS — Z9049 Acquired absence of other specified parts of digestive tract: Secondary | ICD-10-CM | POA: Diagnosis not present

## 2022-09-10 DIAGNOSIS — Z955 Presence of coronary angioplasty implant and graft: Secondary | ICD-10-CM | POA: Insufficient documentation

## 2022-09-10 DIAGNOSIS — I252 Old myocardial infarction: Secondary | ICD-10-CM | POA: Diagnosis not present

## 2022-09-10 LAB — BASIC METABOLIC PANEL
Anion gap: 9 (ref 5–15)
BUN: 22 mg/dL (ref 8–23)
CO2: 27 mmol/L (ref 22–32)
Calcium: 9.8 mg/dL (ref 8.9–10.3)
Chloride: 105 mmol/L (ref 98–111)
Creatinine, Ser: 1.04 mg/dL (ref 0.61–1.24)
GFR, Estimated: >60 mL/min (ref >60)
Glucose, Bld: 136 mg/dL — ABNORMAL HIGH (ref 70–99)
Potassium: 3.7 mmol/L (ref 3.5–5.1)
Sodium: 141 mmol/L (ref 135–145)

## 2022-09-10 LAB — BRAIN NATRIURETIC PEPTIDE: B Natriuretic Peptide: 21 pg/mL (ref 0.0–100.0)

## 2022-09-10 NOTE — Patient Instructions (Addendum)
Reds Clip done today.  EKG done today.  Labs done today. We will contact you only if your labs are abnormal.  HOLD AMLODIPINE UNTIL FOLLOW UP APPOINTMENT.   No medication changes were made. Please continue all current medications as prescribed.  You have been referred to Cardiac Rehab. They will contact you to schedule an appointment.  Your physician has requested that you regularly monitor and record your blood pressure readings at home. Please use the same machine at the same time of day to check your readings and record them to bring to your follow-up visit. Send in readings in 2 weeks via mychart.   Your physician recommends that you schedule a follow-up appointment soon for an echo and in 1 month with Dr. [name]  Your physician has requested that you have an echocardiogram. Echocardiography is a painless test that uses sound waves to create images of your heart. It provides your doctor with information about the size and shape of your heart and how well your heart's chambers and valves are working. This procedure takes approximately one hour. There are no restrictions for this procedure. Please do NOT wear cologne, perfume, aftershave, or lotions (deodorant is allowed). Please arrive 15 minutes prior to your appointment time.  If you have any questions or concerns before your next appointment please send Jose Meyer a message through [place] or call our office at [phone].    TO LEAVE A MESSAGE FOR THE NURSE SELECT OPTION 2, PLEASE LEAVE A MESSAGE INCLUDING: YOUR NAME DATE OF BIRTH CALL BACK NUMBER REASON FOR CALL**this is important as we prioritize the call backs  YOU WILL RECEIVE A CALL BACK THE SAME DAY AS LONG AS YOU CALL BEFORE 4:00 PM   Do the following things EVERYDAY: Weigh yourself in the morning before breakfast. Write it down and keep it in a log. Take your medicines as prescribed Eat low salt foods--Limit salt (sodium) to 2000 mg per day.  Stay as active as you can  everyday Limit all fluids for the day to less than 2 liters   At the [facility], you and your health needs are our priority. As part of our continuing mission to provide you with exceptional heart care, we have created designated Provider Care Teams. These Care Teams include your primary Cardiologist (physician) and Advanced Practice Providers (APPs- Physician Assistants and Nurse Practitioners) who all work together to provide you with the care you need, when you need it.   You may see any of the following providers on your designated Care Team at your next follow up: Dr Glori Bickers Dr Haynes Kerns, NP Lyda Jester, [place] Audry Riles, PharmD   Please be sure to bring in all your medications bottles to every appointment.

## 2022-09-10 NOTE — Progress Notes (Signed)
ReDS Vest / Clip - 09/10/22 1200       ReDS Vest / Clip   Station Marker D    Ruler Value 33    ReDS Value Range Low volume    ReDS Actual Value 30

## 2022-09-10 NOTE — Progress Notes (Signed)
Patient ID: Jose Meyer, male   DOB: Jul 06, 1946, 76 y.o.   MRN: [id number] PCP: Dr. Darcus Austin Cardiology: Dr. Aundra Dubin  76 y.o.with history of prior cerebellar CVA, nonobstructive CAD, HTN, hyperlipidemia presents for followup of CAD, HTN, CVA.  Patient has a history of cerebellar CVA in 2000, documented by MRI.  Symptoms at that time were difficulty with balace and discoordination.  No vertigo at that time.  In 7/10, pt had just gotten dressed for church one Sunday and was not doing anything in particular.  He developed a severe spinning sensation like vertigo that lasted for about 5 minutes.  He was "woozy" for about 1.5 hours after that before finally returning to normal.  He does not remember having the vertigo set off by moving his head in a particular direction.  He went to the ER and had an MRI of his head done, showing only small vessel disease.  There was some concern that this could have been a posterior circulation TIA given prior history of cerebellar CVA.  He did have a TEE in 9/10 showing no PFO, normal LV and RV systolic function, grade III plaque in the aortic arch/descending thoracic aorta.  Patient has known nonobstructive CAD from cath in 2007.  Echo in 9/13 showed normal EF. Cardiolite was done in 2/18, no ischemia (low risk study).    Patient had an inferior STEMI in 11/23 while on vacation in [place], [place].  He reports occasional episodes of lightheadedness with exertion in the weeks leading up to the event but no chest pain.  On the day of the inferior MI, he reports nausea and "indigestion" after dinner that steadily progressed.  He called EMS and was taken to [facility] where inferior STEMI was noted.  He had cath with 99% stenosis proximal RCA treated with DES.  He was noted to be mildly hypotensive and initially received IV fluid.  Later, oxygen saturation was noted to be low and he was given IV Lasix.  Echo showed EF >70% with no WMAs, poor RV visualization.   He returns today for  followup post-MI.  He has noted more dyspnea walking up stairs since the event.  Generally does ok on flat ground.  No chest pain.  He is not getting episodes of lightheadedness now, but BP is low today.  He says that SBP was around 120 when he checked last night, however. No palpitations.   Labs (8/10): creatinine 1.0, LDL 73 Labs (9/12): K 4.1, creatinine 0.83, LDL 53, HDL 42, TGs 131 Labs (9/13): K 4.1, creatinine 0.9, TSH normal Labs (12/15): LDL 62, HDL 51, LFTs normal, K 4, creatinine 0.98 Labs (12/17): K 3.6, creatinine 0.96, hgb 17.1, LDL 53, HDL 67 Labs (1/19): LDL 54, HDL 50 Labs (7/19): K 4.2, creatinine 0.88 Labs (9/19): LDL 59, HDL 47 Labs (9/20): LDL 53, TGs 150, HDL 50, K 3.7, creatinine 0.93 Labs (10/21): LDL 44, TGs 172, hgb 16.8, K 3.9, creatinine 1.04 Labs (11/23): K 3.5, creatinine 1.1, LDL 62  REDS clip 30%  ECG (personally reviewed): NSR, normal  Allergies (verified):  No Known Drug Allergies  Past Medical History: 1. Hyperlipidemia 2. Cerebellar CVA 2000: documented by MRI.  Possible TIA 7/10 with vertigo.  MRI 7/10 showed only small vessel disease.  Possible TIA 7/10.  Carotid dopplers (9/10) with no significant disease.  TEE (9/10): Normal LV and RV size and systolic function.  No PFO.  No significant valvular disease.  At least grade III plaque in the arch  and descending thoracic aorta with no ulceration or mobility.  3. HTN 4. History of colon CA s/p R hemicolectomy in 9/09.  5. psoriasis 6. diverticulosis 7. CAD: nonobstructive.  LHC in 2000 without significant disease.  ETT-cardiolite 2007 with fixed inferior defect.  LHC (4/07) showed EF 60%, 30-40% CFX stenosis, 30-40% mRCA stenosis.  Probably false positive cardiolite.  Echo (9/13): EF 55-60%, mild LVH, grade I diastolic dysfunction, mild to moderate LAE. - Cardiolite (2/18): Medium-sized, fixed inferolateral perfusion defect (likely artifact given normal wall motion), no ischemia, EF 64%.  - Inferior  STEMI (11/23) with 99% proximal RCA treated with DES. Echo in 11/23 with EF > 70%, no WMAs, RV poorly visualized.  8. Palpitations: PVCs.  30 day event monitor in 9/13 with occasional PVCs but no more significant arrhythmias.   9. Chronic low back pain.  10. Plantar fasciitis 11. Psoriasis 12. Basal cell carcinoma  Family History: No premature CAD.   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 Lives in [place].  No smoking since 1975.  Occasional ETOH.  Married with 2 children [facility], cardiologist in [place]).  Enjoys woodworking.  ROS: All systems reviewed and negative except as per HPI.   Current Outpatient Medications  Medication Sig Dispense Refill   amLODipine (NORVASC) 5 MG tablet Take 1 tablet by mouth daily.     aspirin 81 MG chewable tablet Chew 81 mg by mouth daily.     atorvastatin (LIPITOR) 80 MG tablet Take 80 mg by mouth daily.     betamethasone dipropionate 0.05 % lotion Apply 0.53 application topically as needed. For the scalp after shower  0   clotrimazole (LOTRIMIN) 1 % cream APPLY TO BOTH FEET AND BETWEEN TOES TWICE DAILY 30 g 1   diclofenac Sodium (VOLTAREN) 1 % GEL 1 application     ELIDEL 1 % cream Apply 1 application topically as needed. For skin  0   fluorometholone (FML) 0.1 % ophthalmic suspension INT 1 GTT INTO OU UTD PRN  4   fluorouracil (EFUDEX) 5 % cream 1 application     fluticasone (FLONASE) 50 MCG/ACT nasal spray 1 spray in each nostril     furosemide (LASIX) 20 MG tablet Take 20 mg by mouth daily as needed.     hydrochlorothiazide (HYDRODIURIL) 25 MG tablet 1 tablet in the morning     ketoconazole (NIZORAL) 2 % cream      losartan (COZAAR) 25 MG tablet Take 25 mg by mouth daily.     methocarbamol (ROBAXIN) 500 MG tablet TK 1 T PO Q 6 H PRF BACK SPASMS     metoprolol (TOPROL-XL) 100 MG 24 hr tablet Take 50-100 mg by mouth as directed. TAKE 50 MG IN THE AM AND TAKE 100 MG IN THE PM      Multiple Vitamins-Minerals (MULTI FOR HIM 50+) TABS      mupirocin ointment (BACTROBAN) 2 % Apply topically.     SYSTANE ULTRA 0.4-0.3 % SOLN      tacrolimus (PROTOPIC) 0.1 % ointment Apply topically.     ticagrelor (BRILINTA) 90 MG TABS tablet Take by mouth 2 (two) times daily.     triamcinolone cream (KENALOG) 0.1 % 1 application     triamcinolone ointment (KENALOG) 0.1 %      triamcinolone ointment (KENALOG) 0.5 %      No current facility-administered medications for this encounter.    BP (!) 80/50   Pulse 88   Wt 104.7 kg (230  lb 12.8 oz)   SpO2 92%   BMI 31.30 kg/m  General: NAD Neck: JVP 7-8 cm, no thyromegaly or thyroid nodule.  Lungs: Clear to auscultation bilaterally with normal respiratory effort. CV: Nondisplaced PMI.  Heart regular S1/S2, no S3/S4, 1/6 SEM RUSB.  No peripheral edema.  No carotid bruit.  Normal pedal pulses.  Abdomen: Soft, nontender, no hepatosplenomegaly, no distention.  Skin: Intact without lesions or rashes.  Neurologic: Alert and oriented x 3.  Psych: Normal affect. Extremities: No clubbing or cyanosis.  HEENT: Normal.   Assessment/Plan:  1. CAD: Inferior STEMI in 11/23 treated with DES to 99% stenosis proximal RCA, nonobstructive disease in LAD and LCx.  Based on history, suspect he may have had some RV involvement (hypotension initially requiring IV fluid then hypoxemic and treated with Lasix). Echo at [facility] in 11/23 showed EF > 70% with no WMAs, but RV not well-visualized.  No further chest pain/"indigestion," he does note some exertional dyspnea with stairs.  REDS clip not elevated and not significantly volume overloaded on exam.  ECG today was normal.  - Continue ASA 81 + ticagrelor 90 bid.  - Continue atorvastatin 80 daily. - Refer for cardiac rehab.  - I will arrange for repeat echo, the one done at [facility] did not visualize the RV well.  - Check BNP today.  - I will not start him on a diuretic with normal REDS.  2. Hyperlipidemia: Goal LDL <  55. Atorvastatin increased after STEMI.  - Lipids/LFTs in 2 months.  3. HTN: BP is now actually running low.  He denies lightheadedness since leaving the hospital.   - Stop amlodipine for now.  - He will check BP daily after morning meds and send me readings in 2 wks.  4. CVA: H/o CVA/TIA in the remote past. No recent neurological symptoms.   Followup 1 month.   Loralie Champagne 09/10/2022

## 2022-09-11 ENCOUNTER — Encounter (HOSPITAL_COMMUNITY): Payer: Self-pay | Admitting: Cardiology

## 2022-09-11 LAB — LIPOPROTEIN A (LPA): Lipoprotein (a): 85.1 nmol/L — ABNORMAL HIGH (ref <75.0)

## 2022-09-13 ENCOUNTER — Encounter (HOSPITAL_COMMUNITY): Payer: Self-pay | Admitting: Cardiology

## 2022-09-16 ENCOUNTER — Encounter (HOSPITAL_COMMUNITY): Payer: Medicare PPO | Admitting: Cardiology

## 2022-09-16 ENCOUNTER — Ambulatory Visit (HOSPITAL_COMMUNITY)
Admission: RE | Admit: 2022-09-16 | Discharge: 2022-09-16 | Disposition: A | Discharge status: Home / Self Care | Payer: Medicare PPO | Source: Ambulatory Visit | Attending: Family Medicine | Admitting: Family Medicine

## 2022-09-16 DIAGNOSIS — I5022 Chronic systolic (congestive) heart failure: Secondary | ICD-10-CM | POA: Insufficient documentation

## 2022-09-16 DIAGNOSIS — I110 Hypertensive heart disease with heart failure: Secondary | ICD-10-CM | POA: Diagnosis not present

## 2022-09-16 DIAGNOSIS — I2510 Atherosclerotic heart disease of native coronary artery without angina pectoris: Secondary | ICD-10-CM | POA: Insufficient documentation

## 2022-09-16 DIAGNOSIS — E785 Hyperlipidemia, unspecified: Secondary | ICD-10-CM | POA: Insufficient documentation

## 2022-09-16 DIAGNOSIS — Z8673 Personal history of transient ischemic attack (TIA), and cerebral infarction without residual deficits: Secondary | ICD-10-CM | POA: Diagnosis not present

## 2022-09-16 LAB — ECHOCARDIOGRAM COMPLETE
AR max vel: 2.19 cm2
AV Area VTI: 2.54 cm2
AV Area mean vel: 2.14 cm2
AV Mean grad: 9 mmHg
AV Peak grad: 17.1 mmHg
Ao pk vel: 2.07 m/s
Area-P 1/2: 2.21 cm2
Calc EF: 68 %
S' Lateral: 3.1 cm
Single Plane A2C EF: 74.1 %
Single Plane A4C EF: 65.9 %

## 2022-09-16 NOTE — Progress Notes (Signed)
  Echocardiogram 2D Echocardiogram has been performed.  Jose Meyer 09/16/2022, 8:55 AM

## 2022-09-17 ENCOUNTER — Telehealth (HOSPITAL_COMMUNITY): Payer: Self-pay

## 2022-09-17 NOTE — Telephone Encounter (Signed)
Patient aware of results.

## 2022-09-20 ENCOUNTER — Telehealth (HOSPITAL_COMMUNITY): Payer: Self-pay | Admitting: Family Medicine

## 2022-10-01 ENCOUNTER — Ambulatory Visit: Payer: Medicare PPO | Admitting: Podiatry

## 2022-10-01 VITALS — BP 125/80

## 2022-10-01 DIAGNOSIS — B351 Tinea unguium: Secondary | ICD-10-CM | POA: Diagnosis not present

## 2022-10-01 DIAGNOSIS — M79674 Pain in right toe(s): Secondary | ICD-10-CM

## 2022-10-01 DIAGNOSIS — M79675 Pain in left toe(s): Secondary | ICD-10-CM

## 2022-10-01 DIAGNOSIS — L84 Corns and callosities: Secondary | ICD-10-CM | POA: Diagnosis not present

## 2022-10-01 DIAGNOSIS — D689 Coagulation defect, unspecified: Secondary | ICD-10-CM | POA: Diagnosis not present

## 2022-10-01 NOTE — Progress Notes (Signed)
  Subjective:  Patient ID: Jose Meyer, male    DOB: [birth year],  MRN: [id number]  Monia Sabal Beilke presents to clinic today for {jgcomplaint:23593}  Chief Complaint  Patient presents with   Nail Problem    RFC PCP-Morrow,Aaron  PCP VST- Summer2023   New problem(s): None. {jgcomplaint:23593}  PCP is London Pepper, MD.  No Known Allergies  Review of Systems: Negative except as noted in the HPI.  Objective: No changes noted in today's physical examination. Vitals:   10/01/22 0907  BP: 132/82    Jose Meyer is a pleasant 76 y.o. male {jgbodyhabitus:24098} AAO x 3.  Neurovascular Examination: Capillary refill time to digits immediate b/l. Palpable pedal pulses b/l LE. Pedal hair absent. No pain with calf compression b/l. Lower extremity skin temperature gradient within normal limits. Trace edema noted BLE. No cyanosis or clubbing noted b/l LE.  Protective sensation intact 5/5 intact bilaterally with 10g monofilament b/l. Vibratory sensation intact b/l.  Dermatological:  Pedal integument with normal turgor, texture and tone BLE. No open wounds b/l LE. No interdigital macerations noted b/l LE.   Toenails 1-5 b/l elongated, discolored, dystrophic, thickened, crumbly with subungual debris and tenderness to dorsal palpation.   Hyperkeratotic lesion(s) bilateral great toes.  No erythema, no edema, no drainage, no fluctuance.  Musculoskeletal:  Muscle strength 5/5 to all lower extremity muscle groups bilaterally. No pain, crepitus or joint limitation noted with ROM bilateral LE. No gross bony deformities bilaterally. Patient ambulates independent of any assistive aids.  Assessment/Plan: 1. Pain due to onychomycosis of toenails of both feet   2. Callus   3. Clotting disorder ([place])     No orders of the defined types were placed in this encounter.   None {Jgplan:23602::"-Patient/POA to call should there be question/concern in the interim."}   Return in about 3 months  (around 12/31/2022).  Marzetta Board, DPM

## 2022-10-03 ENCOUNTER — Encounter: Payer: Self-pay | Admitting: Podiatry

## 2022-10-03 NOTE — Telephone Encounter (Signed)
Dr Aundra Dubin Please review care everywhere for cath report, if findings dont include cath let us know and we will request via medical records  Thanks

## 2022-10-28 ENCOUNTER — Encounter (HOSPITAL_COMMUNITY): Payer: Self-pay | Admitting: Cardiology

## 2022-10-28 ENCOUNTER — Ambulatory Visit (HOSPITAL_COMMUNITY)
Admission: RE | Admit: 2022-10-28 | Discharge: 2022-10-28 | Disposition: A | Discharge status: Home / Self Care | Payer: Medicare PPO | Source: Ambulatory Visit | Attending: Cardiology | Admitting: Cardiology

## 2022-10-28 VITALS — BP 118/70 | HR 66 | Wt 223.0 lb

## 2022-10-28 DIAGNOSIS — Z7902 Long term (current) use of antithrombotics/antiplatelets: Secondary | ICD-10-CM | POA: Diagnosis not present

## 2022-10-28 DIAGNOSIS — I5022 Chronic systolic (congestive) heart failure: Secondary | ICD-10-CM | POA: Diagnosis not present

## 2022-10-28 DIAGNOSIS — E785 Hyperlipidemia, unspecified: Secondary | ICD-10-CM | POA: Insufficient documentation

## 2022-10-28 DIAGNOSIS — Z79899 Other long term (current) drug therapy: Secondary | ICD-10-CM | POA: Insufficient documentation

## 2022-10-28 DIAGNOSIS — Z955 Presence of coronary angioplasty implant and graft: Secondary | ICD-10-CM | POA: Insufficient documentation

## 2022-10-28 DIAGNOSIS — I252 Old myocardial infarction: Secondary | ICD-10-CM | POA: Insufficient documentation

## 2022-10-28 DIAGNOSIS — Z8673 Personal history of transient ischemic attack (TIA), and cerebral infarction without residual deficits: Secondary | ICD-10-CM | POA: Insufficient documentation

## 2022-10-28 DIAGNOSIS — I2510 Atherosclerotic heart disease of native coronary artery without angina pectoris: Secondary | ICD-10-CM | POA: Diagnosis not present

## 2022-10-28 DIAGNOSIS — I10 Essential (primary) hypertension: Secondary | ICD-10-CM | POA: Diagnosis not present

## 2022-10-28 DIAGNOSIS — Z7982 Long term (current) use of aspirin: Secondary | ICD-10-CM | POA: Insufficient documentation

## 2022-10-28 LAB — LIPID PANEL
Cholesterol: 85 mg/dL (ref 0–200)
HDL: 43 mg/dL (ref >40)
LDL Cholesterol: 27 mg/dL (ref 0–99)
Total CHOL/HDL Ratio: 2 RATIO
Triglycerides: 73 mg/dL (ref <150)
VLDL: 15 mg/dL (ref 0–40)

## 2022-10-28 LAB — HEPATIC FUNCTION PANEL
ALT: 34 U/L (ref 0–44)
AST: 28 U/L (ref 15–41)
Albumin: 3.7 g/dL (ref 3.5–5.0)
Alkaline Phosphatase: 45 U/L (ref 38–126)
Bilirubin, Direct: 0.2 mg/dL (ref 0.0–0.2)
Indirect Bilirubin: 0.9 mg/dL (ref 0.3–0.9)
Total Bilirubin: 1.1 mg/dL (ref 0.3–1.2)
Total Protein: 6.7 g/dL (ref 6.5–8.1)

## 2022-10-28 MED ORDER — ATORVASTATIN CALCIUM 80 MG PO TABS: 80.0000 mg | ORAL_TABLET | Freq: Every day | ORAL | Status: DC

## 2022-10-28 NOTE — Progress Notes (Signed)
Patient ID: Jose Meyer, male   DOB: 01/25/1946, 77 y.o.   MRN: [id number] PCP: Dr. Darcus Austin Cardiology: Dr. Aundra Dubin  77 y.o.with history of prior cerebellar CVA, nonobstructive CAD, HTN, hyperlipidemia presents for followup of CAD, HTN, CVA.  Patient has a history of cerebellar CVA in 2000, documented by MRI.  Symptoms at that time were difficulty with balace and discoordination.  No vertigo at that time.  In 7/10, pt had just gotten dressed for church one Sunday and was not doing anything in particular.  He developed a severe spinning sensation like vertigo that lasted for about 5 minutes.  He was "woozy" for about 1.5 hours after that before finally returning to normal.  He does not remember having the vertigo set off by moving his head in a particular direction.  He went to the ER and had an MRI of his head done, showing only small vessel disease.  There was some concern that this could have been a posterior circulation TIA given prior history of cerebellar CVA.  He did have a TEE in 9/10 showing no PFO, normal LV and RV systolic function, grade III plaque in the aortic arch/descending thoracic aorta.  Patient has known nonobstructive CAD from cath in 2007.  Echo in 9/13 showed normal EF. Cardiolite was done in 2/18, no ischemia (low risk study).    Patient had an inferior STEMI in 11/23 while on vacation in [place], [place].  He reports occasional episodes of lightheadedness with exertion in the weeks leading up to the event but no chest pain.  On the day of the inferior MI, he reports nausea and "indigestion" after dinner that steadily progressed.  He called EMS and was taken to [facility] where inferior STEMI was noted.  He had cath with 99% stenosis proximal RCA treated with DES.  He was noted to be mildly hypotensive and initially received IV fluid.  Later, oxygen saturation was noted to be low and he was given IV Lasix.  Echo showed EF >70% with no WMAs, poor RV visualization.   Echo in 12/23 showed  EF 65-70%, no wall motion abnormalities, normal RV.    He returns today for followup of CAD.  BP has been well-controlled recently, SBP in 110s generally at home.  He is following a Mediterranean diet and has lost about 10 lbs.  He is walking 30 minutes/day for > 1 mile. No dyspnea or chest pain. Mild dyspnea if he walks fast up stairs.  No palpitations.    ECG (personally reviewed): NSR, normal  Labs (8/10): creatinine 1.0, LDL 73 Labs (9/12): K 4.1, creatinine 0.83, LDL 53, HDL 42, TGs 131 Labs (9/13): K 4.1, creatinine 0.9, TSH normal Labs (12/15): LDL 62, HDL 51, LFTs normal, K 4, creatinine 0.98 Labs (12/17): K 3.6, creatinine 0.96, hgb 17.1, LDL 53, HDL 67 Labs (1/19): LDL 54, HDL 50 Labs (7/19): K 4.2, creatinine 0.88 Labs (9/19): LDL 59, HDL 47 Labs (9/20): LDL 53, TGs 150, HDL 50, K 3.7, creatinine 0.93 Labs (10/21): LDL 44, TGs 172, hgb 16.8, K 3.9, creatinine 1.04 Labs (11/23): K 3.5, creatinine 1.1, LDL 62, BNP 21, Lp(a) 85  Allergies (verified):  No Known Drug Allergies  Past Medical History: 1. Hyperlipidemia 2. Cerebellar CVA 2000: documented by MRI.  Possible TIA 7/10 with vertigo.  MRI 7/10 showed only small vessel disease.  Possible TIA 7/10.  Carotid dopplers (9/10) with no significant disease.  TEE (9/10): Normal LV and RV size and systolic function.  No  PFO.  No significant valvular disease.  At least grade III plaque in the arch and descending thoracic aorta with no ulceration or mobility.  3. HTN 4. History of colon CA s/p R hemicolectomy in 9/09.  5. psoriasis 6. diverticulosis 7. CAD: nonobstructive.  LHC in 2000 without significant disease.  ETT-cardiolite 2007 with fixed inferior defect.  LHC (4/07) showed EF 60%, 30-40% CFX stenosis, 30-40% mRCA stenosis.  Probably false positive cardiolite.  Echo (9/13): EF 55-60%, mild LVH, grade I diastolic dysfunction, mild to moderate LAE. - Cardiolite (2/18): Medium-sized, fixed inferolateral perfusion defect (likely  artifact given normal wall motion), no ischemia, EF 64%.  - Inferior STEMI (11/23) with 99% proximal RCA treated with DES. Echo in 11/23 with EF > 70%, no WMAs, RV poorly visualized.  8. Palpitations: PVCs.  30 day event monitor in 9/13 with occasional PVCs but no more significant arrhythmias.   9. Chronic low back pain.  10. Plantar fasciitis 11. Psoriasis 12. Basal cell carcinoma  Family History: No premature CAD.   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 Lives in [place].  No smoking since 1975.  Occasional ETOH.  Married with 2 children [facility], cardiologist in [place]).  Enjoys woodworking.  ROS: All systems reviewed and negative except as per HPI.   Current Outpatient Medications  Medication Sig Dispense Refill   aspirin 81 MG chewable tablet Chew 81 mg by mouth daily.     betamethasone dipropionate 0.05 % lotion Apply 3.97 application topically as needed. For the scalp after shower  0   clotrimazole (LOTRIMIN) 1 % cream APPLY TO BOTH FEET AND BETWEEN TOES TWICE DAILY 30 g 1   diclofenac Sodium (VOLTAREN) 1 % GEL 1 application     ELIDEL 1 % cream Apply 1 application topically as needed. For skin  0   fluorometholone (FML) 0.1 % ophthalmic suspension INT 1 GTT INTO OU UTD PRN  4   fluorouracil (EFUDEX) 5 % cream 1 application     fluticasone (FLONASE) 50 MCG/ACT nasal spray Place 1 spray into both nostrils as needed for allergies or rhinitis.     furosemide (LASIX) 20 MG tablet Take 20 mg by mouth daily as needed.     hydrochlorothiazide (HYDRODIURIL) 25 MG tablet 1 tablet in the morning     ketoconazole (NIZORAL) 2 % cream      losartan (COZAAR) 25 MG tablet Take 25 mg by mouth daily.     methocarbamol (ROBAXIN) 500 MG tablet TK 1 T PO Q 6 H PRF BACK SPASMS     metoprolol (TOPROL-XL) 100 MG 24 hr tablet Take 50-100 mg by mouth as directed. TAKE 50 MG IN THE AM AND TAKE 100 MG IN THE PM     mupirocin ointment  (BACTROBAN) 2 % Apply topically.     SYSTANE ULTRA 0.4-0.3 % SOLN      tacrolimus (PROTOPIC) 0.1 % ointment Apply topically.     ticagrelor (BRILINTA) 90 MG TABS tablet Take by mouth 2 (two) times daily.     triamcinolone cream (KENALOG) 0.1 % 1 application     triamcinolone ointment (KENALOG) 0.1 %      atorvastatin (LIPITOR) 80 MG tablet Take 1 tablet (80 mg total) by mouth daily.     No current facility-administered medications for this encounter.    BP 118/70 (BP Location: Left Arm, Patient Position: Sitting)   Pulse 66   Wt 101.2 kg (223 lb)   SpO2 96%  BMI 30.24 kg/m  General: NAD Neck: No JVD, no thyromegaly or thyroid nodule.  Lungs: Clear to auscultation bilaterally with normal respiratory effort. CV: Nondisplaced PMI.  Heart regular S1/S2, no S3/S4, no murmur.  No peripheral edema.  No carotid bruit.  Normal pedal pulses.  Abdomen: Soft, nontender, no hepatosplenomegaly, no distention.  Skin: Intact without lesions or rashes.  Neurologic: Alert and oriented x 3.  Psych: Normal affect. Extremities: No clubbing or cyanosis.  HEENT: Normal.   Assessment/Plan:  1. CAD: Inferior STEMI in 11/23 treated with DES to 99% stenosis proximal RCA, nonobstructive disease in LAD and LCx.  Based on history, suspect he may have had some RV involvement (hypotension initially requiring IV fluid then hypoxemic and treated with Lasix). Echo at [facility] in 11/23 showed EF > 70% with no WMAs, but RV not well-visualized.  Echo here in 12/23 showed EF 65-70%, normal RV. No dyspnea except with heavy exertion, no chest pain.  - Continue ASA 81 + ticagrelor 90 bid.  - Continue atorvastatin 80 daily, check lipids today.  - He wants to defer cardiac rehab for now, he is exercising regularly on his own and has changed his diet considerably.   2. Hyperlipidemia: Goal LDL < 55. Atorvastatin increased after STEMI. Of note, Lp(a) was mildly elevated at 85.  - Check lipids/LFTs today.  3. HTN: BP is  well-controlled, SBP 110s when he checks.  - Continue Toprol XL - Continue losartan 25 daily - Continue HCTZ 25 mg daily.  4. CVA: H/o CVA/TIA in the remote past. No recent neurological symptoms.   Followup 4 months.   Loralie Champagne 10/28/2022

## 2022-10-28 NOTE — Patient Instructions (Signed)
There has been no changes to your medications.  Labs done today, your results will be available in MyChart, we will contact you for abnormal readings.  Your physician recommends that you schedule a follow-up appointment in: 4 months (May 2024)  **please call the office in March to arrange your follow up appointment **  If you have any questions or concerns before your next appointment please send [place] a message through [place] or call our office at [phone].    TO LEAVE A MESSAGE FOR THE NURSE SELECT OPTION 2, PLEASE LEAVE A MESSAGE INCLUDING: YOUR NAME DATE OF BIRTH CALL BACK NUMBER REASON FOR CALL**this is important as we prioritize the call backs  YOU WILL RECEIVE A CALL BACK THE SAME DAY AS LONG AS YOU CALL BEFORE 4:00 PM  At the [facility], you and your health needs are our priority. As part of our continuing mission to provide you with exceptional heart care, we have created designated Provider Care Teams. These Care Teams include your primary Cardiologist (physician) and Advanced Practice Providers (APPs- Physician Assistants and Nurse Practitioners) who all work together to provide you with the care you need, when you need it.   You may see any of the following providers on your designated Care Team at your next follow up: Dr Glori Bickers Dr Loralie Champagne Dr. Roxana Hires, NP Lyda Jester, [facility], [place] Forestine Na, NP Audry Riles, PharmD   Please be sure to bring in all your medications bottles to every appointment.

## 2022-10-29 ENCOUNTER — Other Ambulatory Visit: Payer: Self-pay | Admitting: Cardiology

## 2022-10-29 ENCOUNTER — Ambulatory Visit
Admission: RE | Admit: 2022-10-29 | Discharge: 2022-10-29 | Disposition: A | Discharge status: Home / Self Care | Payer: Self-pay | Source: Ambulatory Visit | Attending: Cardiology | Admitting: Cardiology

## 2022-10-29 DIAGNOSIS — I2510 Atherosclerotic heart disease of native coronary artery without angina pectoris: Secondary | ICD-10-CM

## 2022-11-25 ENCOUNTER — Other Ambulatory Visit (HOSPITAL_COMMUNITY): Payer: Self-pay

## 2022-11-25 MED ORDER — ATORVASTATIN CALCIUM 80 MG PO TABS: 80.0000 mg | ORAL_TABLET | Freq: Every day | ORAL | Status: DC

## 2022-11-25 MED ORDER — METOPROLOL SUCCINATE ER 100 MG PO TB24: 50.0000 mg | ORAL_TABLET | ORAL | 1 refills | Status: DC

## 2022-11-25 MED ORDER — HYDROCHLOROTHIAZIDE 25 MG PO TABS: ORAL_TABLET | ORAL | 1 refills | Status: DC

## 2022-11-25 MED ORDER — TICAGRELOR 90 MG PO TABS: 90.0000 mg | ORAL_TABLET | Freq: Two times a day (BID) | ORAL | 1 refills | Status: DC

## 2022-11-25 MED ORDER — LOSARTAN POTASSIUM 25 MG PO TABS: 25.0000 mg | ORAL_TABLET | Freq: Every day | ORAL | 1 refills | Status: DC

## 2022-11-28 MED ORDER — ATORVASTATIN CALCIUM 80 MG PO TABS: 80.0000 mg | ORAL_TABLET | Freq: Every day | ORAL | 3 refills | Status: DC

## 2022-11-28 NOTE — Addendum Note (Signed)
Addended by: Kerry Dory on: 11/28/2022 01:02 PM   Modules accepted: Orders

## 2022-11-29 DIAGNOSIS — L820 Inflamed seborrheic keratosis: Secondary | ICD-10-CM | POA: Diagnosis not present

## 2022-11-29 DIAGNOSIS — C44519 Basal cell carcinoma of skin of other part of trunk: Secondary | ICD-10-CM | POA: Diagnosis not present

## 2022-11-29 DIAGNOSIS — Z85828 Personal history of other malignant neoplasm of skin: Secondary | ICD-10-CM | POA: Diagnosis not present

## 2022-11-29 DIAGNOSIS — L821 Other seborrheic keratosis: Secondary | ICD-10-CM | POA: Diagnosis not present

## 2022-11-29 DIAGNOSIS — L578 Other skin changes due to chronic exposure to nonionizing radiation: Secondary | ICD-10-CM | POA: Diagnosis not present

## 2022-11-29 DIAGNOSIS — D225 Melanocytic nevi of trunk: Secondary | ICD-10-CM | POA: Diagnosis not present

## 2022-11-29 DIAGNOSIS — D2271 Melanocytic nevi of right lower limb, including hip: Secondary | ICD-10-CM | POA: Diagnosis not present

## 2022-11-29 DIAGNOSIS — D485 Neoplasm of uncertain behavior of skin: Secondary | ICD-10-CM | POA: Diagnosis not present

## 2022-11-29 DIAGNOSIS — Z86018 Personal history of other benign neoplasm: Secondary | ICD-10-CM | POA: Diagnosis not present

## 2022-11-29 DIAGNOSIS — D2272 Melanocytic nevi of left lower limb, including hip: Secondary | ICD-10-CM | POA: Diagnosis not present

## 2022-12-14 ENCOUNTER — Other Ambulatory Visit (HOSPITAL_COMMUNITY): Payer: Self-pay | Admitting: Cardiology

## 2022-12-15 ENCOUNTER — Other Ambulatory Visit (HOSPITAL_COMMUNITY): Payer: Self-pay | Admitting: Cardiology

## 2023-01-09 ENCOUNTER — Encounter (HOSPITAL_COMMUNITY): Payer: Self-pay | Admitting: Cardiology

## 2023-01-09 DIAGNOSIS — I10 Essential (primary) hypertension: Secondary | ICD-10-CM | POA: Diagnosis not present

## 2023-01-09 DIAGNOSIS — Z0000 Encounter for general adult medical examination without abnormal findings: Secondary | ICD-10-CM | POA: Diagnosis not present

## 2023-01-09 DIAGNOSIS — R7303 Prediabetes: Secondary | ICD-10-CM | POA: Diagnosis not present

## 2023-01-09 DIAGNOSIS — R609 Edema, unspecified: Secondary | ICD-10-CM | POA: Diagnosis not present

## 2023-01-09 DIAGNOSIS — E7800 Pure hypercholesterolemia, unspecified: Secondary | ICD-10-CM | POA: Diagnosis not present

## 2023-01-09 DIAGNOSIS — J432 Centrilobular emphysema: Secondary | ICD-10-CM | POA: Diagnosis not present

## 2023-01-09 DIAGNOSIS — R7309 Other abnormal glucose: Secondary | ICD-10-CM | POA: Diagnosis not present

## 2023-01-09 DIAGNOSIS — I25119 Atherosclerotic heart disease of native coronary artery with unspecified angina pectoris: Secondary | ICD-10-CM | POA: Diagnosis not present

## 2023-01-21 ENCOUNTER — Ambulatory Visit: Payer: Medicare PPO | Admitting: Podiatry

## 2023-01-21 ENCOUNTER — Encounter: Payer: Self-pay | Admitting: Podiatry

## 2023-01-21 DIAGNOSIS — L84 Corns and callosities: Secondary | ICD-10-CM | POA: Diagnosis not present

## 2023-01-21 DIAGNOSIS — D689 Coagulation defect, unspecified: Secondary | ICD-10-CM

## 2023-01-21 DIAGNOSIS — M79675 Pain in left toe(s): Secondary | ICD-10-CM | POA: Diagnosis not present

## 2023-01-21 DIAGNOSIS — B351 Tinea unguium: Secondary | ICD-10-CM

## 2023-01-21 DIAGNOSIS — M79674 Pain in right toe(s): Secondary | ICD-10-CM | POA: Diagnosis not present

## 2023-01-21 NOTE — Progress Notes (Signed)
  Subjective:  Patient ID: Jose Meyer, male    DOB: Nov 06, 1945,  MRN: [id number]  Jose Meyer presents to clinic today for at risk foot care with h/o clotting disorder  Chief Complaint  Patient presents with   Nail Problem    RFC PCP-Morrow PCP VST- 2 weeks ago   Patient states his right great toe is tender on today's visit.  PCP is Farris Has, MD.  No Known Allergies  Review of Systems: Negative except as noted in the HPI.  Objective:  There were no vitals filed for this visit. Jose Meyer is a pleasant 77 y.o. male WD, WN in NAD. AAO x 3.  Neurovascular Examination: Capillary refill time to digits immediate b/l. Palpable pedal pulses b/l LE. Pedal hair absent. No pain with calf compression b/l. Lower extremity skin temperature gradient within normal limits. Trace edema noted BLE. No cyanosis or clubbing noted b/l LE.  Protective sensation intact 5/5 intact bilaterally with 10g monofilament b/l. Vibratory sensation intact b/l.  Dermatological:  Pedal integument with normal turgor, texture and tone BLE. No open wounds b/l LE. No interdigital macerations noted b/l LE.   Toenails left great toe and 2-5 b/l elongated, discolored, dystrophic, thickened, crumbly with subungual debris and tenderness to dorsal palpation.   Incurvated nailplate right great toe medial border(s) with tenderness to palpation. No erythema, no edema, no drainage noted.   Hyperkeratotic lesion(s) medial aspect 1st metatarsal head b/l and medial IPJ of bilateral great toes.  No erythema, no edema, no drainage, no fluctuance.  Musculoskeletal:  Muscle strength 5/5 to all lower extremity muscle groups bilaterally. No pain, crepitus or joint limitation noted with ROM bilateral LE. No gross bony deformities bilaterally. Patient ambulates independent of any assistive aids.  Assessment/Plan: 1. Pain due to onychomycosis of toenails of both feet   2. Callus   3. Clotting disorder      -Consent given for treatment as described below: -Examined patient. -Toenails were debrided in length and girth 2-5 bilaterally and left great toe with sterile nail nippers and dremel without iatrogenic bleeding.  -No invasive procedure(s) performed. Offending nail border debrided and curretaged right great toe utilizing sterile nail nipper and currette. Border cleansed with alcohol and Betadine ointment. No further treatment required by patient/caregiver. Call office if there are any concerns. -Callus(es) bilateral great toes and 1st metatarsal head b/l feet pared utilizing sharp debridement with sterile blade without complication or incident. Total number debrided =4. -Patient/POA to call should there be question/concern in the interim.   Return in about 3 months (around 04/22/2023).  [name], DPM

## 2023-01-22 DIAGNOSIS — M25511 Pain in right shoulder: Secondary | ICD-10-CM | POA: Diagnosis not present

## 2023-02-12 DIAGNOSIS — C44519 Basal cell carcinoma of skin of other part of trunk: Secondary | ICD-10-CM | POA: Diagnosis not present

## 2023-02-12 DIAGNOSIS — L988 Other specified disorders of the skin and subcutaneous tissue: Secondary | ICD-10-CM | POA: Diagnosis not present

## 2023-02-19 DIAGNOSIS — M25562 Pain in left knee: Secondary | ICD-10-CM | POA: Diagnosis not present

## 2023-02-24 DIAGNOSIS — M25511 Pain in right shoulder: Secondary | ICD-10-CM | POA: Diagnosis not present

## 2023-02-25 DIAGNOSIS — L820 Inflamed seborrheic keratosis: Secondary | ICD-10-CM | POA: Diagnosis not present

## 2023-02-25 DIAGNOSIS — L570 Actinic keratosis: Secondary | ICD-10-CM | POA: Diagnosis not present

## 2023-02-25 DIAGNOSIS — Z4802 Encounter for removal of sutures: Secondary | ICD-10-CM | POA: Diagnosis not present

## 2023-02-27 ENCOUNTER — Encounter (HOSPITAL_COMMUNITY): Payer: Self-pay | Admitting: Cardiology

## 2023-03-03 ENCOUNTER — Encounter (HOSPITAL_COMMUNITY): Payer: Self-pay | Admitting: Cardiology

## 2023-03-03 ENCOUNTER — Ambulatory Visit (HOSPITAL_COMMUNITY)
Admission: RE | Admit: 2023-03-03 | Discharge: 2023-03-03 | Disposition: A | Discharge status: Home / Self Care | Payer: Medicare PPO | Source: Ambulatory Visit | Attending: Cardiology | Admitting: Cardiology

## 2023-03-03 VITALS — BP 104/60 | HR 65 | Wt 206.8 lb

## 2023-03-03 DIAGNOSIS — Z8673 Personal history of transient ischemic attack (TIA), and cerebral infarction without residual deficits: Secondary | ICD-10-CM | POA: Insufficient documentation

## 2023-03-03 DIAGNOSIS — Z7982 Long term (current) use of aspirin: Secondary | ICD-10-CM | POA: Diagnosis not present

## 2023-03-03 DIAGNOSIS — E785 Hyperlipidemia, unspecified: Secondary | ICD-10-CM | POA: Diagnosis not present

## 2023-03-03 DIAGNOSIS — I5022 Chronic systolic (congestive) heart failure: Secondary | ICD-10-CM

## 2023-03-03 DIAGNOSIS — Z955 Presence of coronary angioplasty implant and graft: Secondary | ICD-10-CM | POA: Insufficient documentation

## 2023-03-03 DIAGNOSIS — I110 Hypertensive heart disease with heart failure: Secondary | ICD-10-CM | POA: Diagnosis not present

## 2023-03-03 DIAGNOSIS — Z7902 Long term (current) use of antithrombotics/antiplatelets: Secondary | ICD-10-CM | POA: Insufficient documentation

## 2023-03-03 DIAGNOSIS — Z79899 Other long term (current) drug therapy: Secondary | ICD-10-CM | POA: Insufficient documentation

## 2023-03-03 DIAGNOSIS — I252 Old myocardial infarction: Secondary | ICD-10-CM | POA: Diagnosis not present

## 2023-03-03 DIAGNOSIS — I2510 Atherosclerotic heart disease of native coronary artery without angina pectoris: Secondary | ICD-10-CM | POA: Diagnosis not present

## 2023-03-03 LAB — BASIC METABOLIC PANEL
Anion gap: 10 (ref 5–15)
BUN: 15 mg/dL (ref 8–23)
CO2: 28 mmol/L (ref 22–32)
Calcium: 9 mg/dL (ref 8.9–10.3)
Chloride: 100 mmol/L (ref 98–111)
Creatinine, Ser: 0.96 mg/dL (ref 0.61–1.24)
GFR, Estimated: >60 mL/min (ref >60)
Glucose, Bld: 99 mg/dL (ref 70–99)
Potassium: 3.5 mmol/L (ref 3.5–5.1)
Sodium: 138 mmol/L (ref 135–145)

## 2023-03-03 NOTE — Patient Instructions (Signed)
There has been no changes to your medications.  Labs done today, your results will be available in MyChart, we will contact you for abnormal readings.  Your physician recommends that you schedule a follow-up appointment in: 6 months ( November ) ** please call the office in August to arrange your follow up appointment. **  If you have any questions or concerns before your next appointment please send [place] a message through [place] or call our office at [phone].    TO LEAVE A MESSAGE FOR THE NURSE SELECT OPTION 2, PLEASE LEAVE A MESSAGE INCLUDING: YOUR NAME DATE OF BIRTH CALL BACK NUMBER REASON FOR CALL**this is important as we prioritize the call backs  YOU WILL RECEIVE A CALL BACK THE SAME DAY AS LONG AS YOU CALL BEFORE 4:00 PM  At the [facility], you and your health needs are our priority. As part of our continuing mission to provide you with exceptional heart care, we have created designated Provider Care Teams. These Care Teams include your primary Cardiologist (physician) and Advanced Practice Providers (APPs- Physician Assistants and Nurse Practitioners) who all work together to provide you with the care you need, when you need it.   You may see any of the following providers on your designated Care Team at your next follow up: Dr Arvilla Meres Dr Marca Ancona Dr. Marcos Eke, NP Robbie Lis, [place] [facility] [place], [place] Brynda Peon, NP Karle Plumber, PharmD   Please be sure to bring in all your medications bottles to every appointment.    Thank you for choosing [facility]

## 2023-03-04 NOTE — Progress Notes (Signed)
Patient ID: Jose Meyer, male   DOB: 1945-10-19, 77 y.o.   MRN: [id number] PCP: Dr. Shaune Pollack Cardiology: Dr. Shirlee Latch  77 y.o.with history of prior cerebellar CVA, nonobstructive CAD, HTN, hyperlipidemia presents for followup of CAD, HTN, CVA.  Patient has a history of cerebellar CVA in 2000, documented by MRI.  Symptoms at that time were difficulty with balace and discoordination.  No vertigo at that time.  In 7/10, pt had just gotten dressed for church one Sunday and was not doing anything in particular.  He developed a severe spinning sensation like vertigo that lasted for about 5 minutes.  He was "woozy" for about 1.5 hours after that before finally returning to normal.  He does not remember having the vertigo set off by moving his head in a particular direction.  He went to the ER and had an MRI of his head done, showing only small vessel disease.  There was some concern that this could have been a posterior circulation TIA given prior history of cerebellar CVA.  He did have a TEE in 9/10 showing no PFO, normal LV and RV systolic function, grade III plaque in the aortic arch/descending thoracic aorta.  Patient has known nonobstructive CAD from cath in 2007.  Echo in 9/13 showed normal EF. Cardiolite was done in 2/18, no ischemia (low risk study).    Patient had an inferior STEMI in 11/23 while on vacation in [place], [place].  He reports occasional episodes of lightheadedness with exertion in the weeks leading up to the event but no chest pain.  On the day of the inferior MI, he reports nausea and "indigestion" after dinner that steadily progressed.  He called EMS and was taken to [facility] where inferior STEMI was noted.  He had cath with 99% stenosis proximal RCA treated with DES.  He was noted to be mildly hypotensive and initially received IV fluid.  Later, oxygen saturation was noted to be low and he was given IV Lasix.  Echo showed EF >70% with no WMAs, poor RV visualization.   Echo in 12/23 showed  EF 65-70%, no wall motion abnormalities, normal RV.    He returns today for followup of CAD.  BP control has been excellent (he brings readings).  He has lost 17 lbs. No exertional dyspnea or chest pain.  Walking 30-45 minutes most days recently.  Has been remodeling a bathroom.  Has used Lasix only once since last appointment after salty meal.  Overall, feels quite good.   ECG (personally reviewed): NSR, LVH  Labs (8/10): creatinine 1.0, LDL 73 Labs (9/12): K 4.1, creatinine 0.83, LDL 53, HDL 42, TGs 131 Labs (9/13): K 4.1, creatinine 0.9, TSH normal Labs (12/15): LDL 62, HDL 51, LFTs normal, K 4, creatinine 0.98 Labs (12/17): K 3.6, creatinine 0.96, hgb 17.1, LDL 53, HDL 67 Labs (1/19): LDL 54, HDL 50 Labs (7/19): K 4.2, creatinine 0.88 Labs (9/19): LDL 59, HDL 47 Labs (9/20): LDL 53, TGs 150, HDL 50, K 3.7, creatinine 6.96 Labs (10/21): LDL 44, TGs 172, hgb 16.8, K 3.9, creatinine 1.04 Labs (11/23): K 3.5, creatinine 1.1, LDL 62, BNP 21, Lp(a) 85 Labs (1/24): LDL 27  Allergies (verified):  No Known Drug Allergies  Past Medical History: 1. Hyperlipidemia 2. Cerebellar CVA 2000: documented by MRI.  Possible TIA 7/10 with vertigo.  MRI 7/10 showed only small vessel disease.  Possible TIA 7/10.  Carotid dopplers (9/10) with no significant disease.  TEE (9/10): Normal LV and RV size and systolic  function.  No PFO.  No significant valvular disease.  At least grade III plaque in the arch and descending thoracic aorta with no ulceration or mobility.  3. HTN 4. History of colon CA s/p R hemicolectomy in 9/09.  5. psoriasis 6. diverticulosis 7. CAD: nonobstructive.  LHC in 2000 without significant disease.  ETT-cardiolite 2007 with fixed inferior defect.  LHC (4/07) showed EF 60%, 30-40% CFX stenosis, 30-40% mRCA stenosis.  Probably false positive cardiolite.  Echo (9/13): EF 55-60%, mild LVH, grade I diastolic dysfunction, mild to moderate LAE. - Cardiolite (2/18): Medium-sized, fixed  inferolateral perfusion defect (likely artifact given normal wall motion), no ischemia, EF 64%.  - Inferior STEMI (11/23) with 99% proximal RCA treated with DES. Echo in 11/23 with EF > 70%, no WMAs, RV poorly visualized.  8. Palpitations: PVCs.  30 day event monitor in 9/13 with occasional PVCs but no more significant arrhythmias.   9. Chronic low back pain.  10. Plantar fasciitis 11. Psoriasis 12. Basal cell carcinoma  Family History: No premature CAD.   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 Lives in [place].  No smoking since 1975.  Occasional ETOH.  Married with 2 children [facility], cardiologist in [place]).  Enjoys woodworking.  ROS: All systems reviewed and negative except as per HPI.   Current Outpatient Medications  Medication Sig Dispense Refill   aspirin 81 MG chewable tablet Chew 81 mg by mouth daily.     atorvastatin (LIPITOR) 80 MG tablet Take 1 tablet (80 mg total) by mouth daily. 90 tablet 3   betamethasone dipropionate 0.05 % lotion Apply 0.05 application topically as needed. For the scalp after shower  0   diclofenac Sodium (VOLTAREN) 1 % GEL 1 application     ELIDEL 1 % cream Apply 1 application topically as needed. For skin  0   fluorometholone (FML) 0.1 % ophthalmic suspension INT 1 GTT INTO OU UTD PRN  4   fluticasone (FLONASE) 50 MCG/ACT nasal spray Place 1 spray into both nostrils as needed for allergies or rhinitis.     furosemide (LASIX) 20 MG tablet Take 20 mg by mouth daily as needed.     hydrochlorothiazide (HYDRODIURIL) 25 MG tablet 1 tablet in the morning 90 tablet 1   losartan (COZAAR) 25 MG tablet Take 1 tablet (25 mg total) by mouth daily. 90 tablet 1   methocarbamol (ROBAXIN) 500 MG tablet TK 1 T PO Q 6 H PRF BACK SPASMS     metoprolol succinate (TOPROL-XL) 100 MG 24 hr tablet TAKE 1/2 TABLET BY MOUTH IN THE MORNING AND 1 TABLET IN THE EVENING 90 tablet 1   SYSTANE ULTRA 0.4-0.3 % SOLN       ticagrelor (BRILINTA) 90 MG TABS tablet Take 1 tablet (90 mg total) by mouth 2 (two) times daily. 180 tablet 1   No current facility-administered medications for this encounter.    BP 104/60   Pulse 65   Wt 93.8 kg (206 lb 12.8 oz)   SpO2 95%   BMI 28.05 kg/m  General: NAD Neck: No JVD, no thyromegaly or thyroid nodule.  Lungs: Clear to auscultation bilaterally with normal respiratory effort. CV: Nondisplaced PMI.  Heart regular S1/S2, no S3/S4, no murmur.  No peripheral edema.  No carotid bruit.  Normal pedal pulses.  Abdomen: Soft, nontender, no hepatosplenomegaly, no distention.  Skin: Intact without lesions or rashes.  Neurologic: Alert and oriented x 3.  Psych: Normal affect. Extremities: No clubbing  or cyanosis.  HEENT: Normal.   Assessment/Plan:  1. CAD: Inferior STEMI in 11/23 treated with DES to 99% stenosis proximal RCA, nonobstructive disease in LAD and LCx.  Based on history, suspect he may have had some RV involvement (hypotension initially requiring IV fluid then hypoxemic and treated with Lasix). Echo at [facility] in 11/23 showed EF > 70% with no WMAs, but RV not well-visualized.  Echo here in 12/23 showed EF 65-70%, normal RV. No dyspnea or chest pain.  - Continue ASA 81 + ticagrelor 90 bid.  - Continue atorvastatin 80 daily, excellent lipids in 1/24. - Continue exercise.  2. Hyperlipidemia: Goal LDL < 55. Atorvastatin increased after STEMI. Of note, Lp(a) was mildly elevated at 85. Great LDL in 1/24 (27).   3. HTN: BP is well-controlled, SBP 110s-120s.  - Continue Toprol XL - Continue losartan 25 daily - Continue HCTZ 25 mg daily. BMET today.  4. CVA: H/o CVA/TIA in the remote past. No recent neurological symptoms.   Followup 6 months.   Marca Ancona 03/04/2023

## 2023-03-06 DIAGNOSIS — N3000 Acute cystitis without hematuria: Secondary | ICD-10-CM | POA: Diagnosis not present

## 2023-03-06 DIAGNOSIS — R7303 Prediabetes: Secondary | ICD-10-CM | POA: Diagnosis not present

## 2023-03-13 ENCOUNTER — Encounter (HOSPITAL_COMMUNITY): Payer: Self-pay | Admitting: Cardiology

## 2023-03-28 DIAGNOSIS — J3081 Allergic rhinitis due to animal (cat) (dog) hair and dander: Secondary | ICD-10-CM | POA: Diagnosis not present

## 2023-03-28 DIAGNOSIS — R499 Unspecified voice and resonance disorder: Secondary | ICD-10-CM | POA: Diagnosis not present

## 2023-03-28 DIAGNOSIS — K219 Gastro-esophageal reflux disease without esophagitis: Secondary | ICD-10-CM | POA: Diagnosis not present

## 2023-03-28 DIAGNOSIS — J301 Allergic rhinitis due to pollen: Secondary | ICD-10-CM | POA: Diagnosis not present

## 2023-04-22 ENCOUNTER — Other Ambulatory Visit (HOSPITAL_COMMUNITY): Payer: Self-pay | Admitting: Cardiology

## 2023-04-22 MED ORDER — FUROSEMIDE 20 MG PO TABS: 20.0000 mg | ORAL_TABLET | Freq: Every day | ORAL | 11 refills | Status: AC | PRN

## 2023-05-07 DIAGNOSIS — M1712 Unilateral primary osteoarthritis, left knee: Secondary | ICD-10-CM | POA: Diagnosis not present

## 2023-05-14 DIAGNOSIS — M1712 Unilateral primary osteoarthritis, left knee: Secondary | ICD-10-CM | POA: Diagnosis not present

## 2023-05-21 DIAGNOSIS — M1712 Unilateral primary osteoarthritis, left knee: Secondary | ICD-10-CM | POA: Diagnosis not present

## 2023-05-25 ENCOUNTER — Other Ambulatory Visit (HOSPITAL_COMMUNITY): Payer: Self-pay | Admitting: Cardiology

## 2023-05-27 ENCOUNTER — Encounter: Payer: Self-pay | Admitting: Podiatry

## 2023-05-27 ENCOUNTER — Ambulatory Visit: Payer: Medicare PPO | Admitting: Podiatry

## 2023-05-27 DIAGNOSIS — B351 Tinea unguium: Secondary | ICD-10-CM

## 2023-05-27 DIAGNOSIS — M79674 Pain in right toe(s): Secondary | ICD-10-CM | POA: Diagnosis not present

## 2023-05-27 DIAGNOSIS — M79675 Pain in left toe(s): Secondary | ICD-10-CM

## 2023-05-27 DIAGNOSIS — D689 Coagulation defect, unspecified: Secondary | ICD-10-CM

## 2023-05-27 DIAGNOSIS — L84 Corns and callosities: Secondary | ICD-10-CM | POA: Diagnosis not present

## 2023-05-27 NOTE — Progress Notes (Signed)
  Subjective:  Patient ID: Jose Meyer, male    DOB: September 29, 1946,  MRN: [id number]  [name] presents to clinic today for: at risk foot care with h/o clotting disorder and callus(es) of both feet and painful thick toenails that are difficult to trim. Painful toenails interfere with ambulation. Aggravating factors include wearing enclosed shoe gear. Pain is relieved with periodic professional debridement. Painful calluses are aggravated when weightbearing with and without shoegear. Pain is relieved with periodic professional debridement.  Chief Complaint  Patient presents with   RFC    RFC   PCP is Farris Has, MD.  No Known Allergies  Review of Systems: Negative except as noted in the HPI.  Objective: No changes noted in today's physical examination. There were no vitals filed for this visit.  [name] is a pleasant 77 y.o. male in NAD. AAO x 3.  Vascular Examination: Capillary refill time <3 seconds b/l LE. Palpable pedal pulses b/l LE. Digital hair present b/l. No pedal edema b/l. Skin temperature gradient WNL b/l. No varicosities b/l. Trace edema noted BLE.Marland Kitchen  Dermatological Examination: Pedal skin with normal turgor, texture and tone b/l. No open wounds. No interdigital macerations b/l. Toenails 1-5 b/l thickened, discolored, dystrophic with subungual debris. There is pain on palpation to dorsal aspect of nailplates. Hyperkeratotic lesion(s) right great toe and submet head 1 left foot.  No erythema, no edema, no drainage, no fluctuance..  Neurological Examination: Protective sensation intact with 10 gram monofilament b/l LE. Vibratory sensation intact b/l LE.   Musculoskeletal Examination: Muscle strength 5/5 to all lower extremity muscle groups bilaterally. No pain, crepitus or joint limitation noted with ROM bilateral LE. No gross bony deformities bilaterally.  Assessment/Plan: 1. Pain due to onychomycosis of toenails of both feet   2. Callus   3.  Clotting disorder [facility])    -Patient was evaluated and treated. All patient's and/or POA's questions/concerns answered on today's visit. -Patient to continue soft, supportive shoe gear daily. -Mycotic toenails 1-5 bilaterally were debrided in length and girth with sterile nail nippers and dremel. Pinpoint bleeding of left great toe addressed with Lumicain Hemostatic Solution, cleansed with alcohol. Triple antibiotic ointment applied. No further treatment required by patient/caregiver. -Callus(es) right great toe and submet head 1 left foot pared utilizing sterile scalpel blade without complication or incident. Total number debrided =2. -Patient/POA to call should there be question/concern in the interim.   Return in about 3 months (around 08/27/2023).  [name], DPM

## 2023-05-29 ENCOUNTER — Other Ambulatory Visit (HOSPITAL_COMMUNITY): Payer: Self-pay | Admitting: Cardiology

## 2023-05-29 MED ORDER — LOSARTAN POTASSIUM 25 MG PO TABS: 25.0000 mg | ORAL_TABLET | Freq: Every day | ORAL | 1 refills | Status: DC

## 2023-05-29 MED ORDER — METOPROLOL SUCCINATE ER 100 MG PO TB24: ORAL_TABLET | ORAL | 1 refills | Status: DC

## 2023-06-02 DIAGNOSIS — M25512 Pain in left shoulder: Secondary | ICD-10-CM | POA: Diagnosis not present

## 2023-06-03 DIAGNOSIS — L409 Psoriasis, unspecified: Secondary | ICD-10-CM | POA: Diagnosis not present

## 2023-06-03 DIAGNOSIS — D225 Melanocytic nevi of trunk: Secondary | ICD-10-CM | POA: Diagnosis not present

## 2023-06-03 DIAGNOSIS — L570 Actinic keratosis: Secondary | ICD-10-CM | POA: Diagnosis not present

## 2023-06-03 DIAGNOSIS — D2271 Melanocytic nevi of right lower limb, including hip: Secondary | ICD-10-CM | POA: Diagnosis not present

## 2023-06-03 DIAGNOSIS — D2272 Melanocytic nevi of left lower limb, including hip: Secondary | ICD-10-CM | POA: Diagnosis not present

## 2023-06-03 DIAGNOSIS — L578 Other skin changes due to chronic exposure to nonionizing radiation: Secondary | ICD-10-CM | POA: Diagnosis not present

## 2023-06-03 DIAGNOSIS — L821 Other seborrheic keratosis: Secondary | ICD-10-CM | POA: Diagnosis not present

## 2023-06-03 DIAGNOSIS — Z85828 Personal history of other malignant neoplasm of skin: Secondary | ICD-10-CM | POA: Diagnosis not present

## 2023-06-03 DIAGNOSIS — Z86018 Personal history of other benign neoplasm: Secondary | ICD-10-CM | POA: Diagnosis not present

## 2023-06-23 ENCOUNTER — Encounter (HOSPITAL_COMMUNITY): Payer: Self-pay | Admitting: Cardiology

## 2023-06-23 DIAGNOSIS — M25512 Pain in left shoulder: Secondary | ICD-10-CM | POA: Diagnosis not present

## 2023-07-01 DIAGNOSIS — H25813 Combined forms of age-related cataract, bilateral: Secondary | ICD-10-CM | POA: Diagnosis not present

## 2023-07-01 DIAGNOSIS — H04123 Dry eye syndrome of bilateral lacrimal glands: Secondary | ICD-10-CM | POA: Diagnosis not present

## 2023-07-01 DIAGNOSIS — H524 Presbyopia: Secondary | ICD-10-CM | POA: Diagnosis not present

## 2023-07-01 DIAGNOSIS — H35373 Puckering of macula, bilateral: Secondary | ICD-10-CM | POA: Diagnosis not present

## 2023-07-01 DIAGNOSIS — H0102A Squamous blepharitis right eye, upper and lower eyelids: Secondary | ICD-10-CM | POA: Diagnosis not present

## 2023-07-07 NOTE — Telephone Encounter (Signed)
Sending to provider as Lorain Childes

## 2023-07-09 DIAGNOSIS — I10 Essential (primary) hypertension: Secondary | ICD-10-CM | POA: Diagnosis not present

## 2023-07-09 DIAGNOSIS — Z8673 Personal history of transient ischemic attack (TIA), and cerebral infarction without residual deficits: Secondary | ICD-10-CM | POA: Diagnosis not present

## 2023-07-09 DIAGNOSIS — R7303 Prediabetes: Secondary | ICD-10-CM | POA: Diagnosis not present

## 2023-07-09 DIAGNOSIS — E7800 Pure hypercholesterolemia, unspecified: Secondary | ICD-10-CM | POA: Diagnosis not present

## 2023-07-25 DIAGNOSIS — M25512 Pain in left shoulder: Secondary | ICD-10-CM | POA: Diagnosis not present

## 2023-08-04 ENCOUNTER — Encounter (HOSPITAL_COMMUNITY): Payer: Self-pay | Admitting: Cardiology

## 2023-08-04 ENCOUNTER — Ambulatory Visit (HOSPITAL_COMMUNITY)
Admission: RE | Admit: 2023-08-04 | Discharge: 2023-08-04 | Disposition: A | Discharge status: Home / Self Care | Payer: Medicare PPO | Source: Ambulatory Visit | Attending: Cardiology | Admitting: Cardiology

## 2023-08-04 VITALS — BP 130/80 | HR 65 | Wt 210.0 lb

## 2023-08-04 DIAGNOSIS — Z79899 Other long term (current) drug therapy: Secondary | ICD-10-CM | POA: Insufficient documentation

## 2023-08-04 DIAGNOSIS — Z8673 Personal history of transient ischemic attack (TIA), and cerebral infarction without residual deficits: Secondary | ICD-10-CM | POA: Diagnosis not present

## 2023-08-04 DIAGNOSIS — E785 Hyperlipidemia, unspecified: Secondary | ICD-10-CM | POA: Diagnosis not present

## 2023-08-04 DIAGNOSIS — I252 Old myocardial infarction: Secondary | ICD-10-CM | POA: Insufficient documentation

## 2023-08-04 DIAGNOSIS — Z7982 Long term (current) use of aspirin: Secondary | ICD-10-CM | POA: Diagnosis not present

## 2023-08-04 DIAGNOSIS — I5022 Chronic systolic (congestive) heart failure: Secondary | ICD-10-CM | POA: Diagnosis not present

## 2023-08-04 DIAGNOSIS — I2510 Atherosclerotic heart disease of native coronary artery without angina pectoris: Secondary | ICD-10-CM | POA: Diagnosis not present

## 2023-08-04 DIAGNOSIS — Z955 Presence of coronary angioplasty implant and graft: Secondary | ICD-10-CM | POA: Insufficient documentation

## 2023-08-04 DIAGNOSIS — Z7902 Long term (current) use of antithrombotics/antiplatelets: Secondary | ICD-10-CM | POA: Insufficient documentation

## 2023-08-04 DIAGNOSIS — Z7182 Exercise counseling: Secondary | ICD-10-CM | POA: Insufficient documentation

## 2023-08-04 DIAGNOSIS — I10 Essential (primary) hypertension: Secondary | ICD-10-CM | POA: Insufficient documentation

## 2023-08-04 LAB — BASIC METABOLIC PANEL
Anion gap: 12 (ref 5–15)
BUN: 16 mg/dL (ref 8–23)
CO2: 27 mmol/L (ref 22–32)
Calcium: 9.5 mg/dL (ref 8.9–10.3)
Chloride: 102 mmol/L (ref 98–111)
Creatinine, Ser: 0.88 mg/dL (ref 0.61–1.24)
GFR, Estimated: >60 mL/min (ref >60)
Glucose, Bld: 101 mg/dL — ABNORMAL HIGH (ref 70–99)
Potassium: 3.4 mmol/L — ABNORMAL LOW (ref 3.5–5.1)
Sodium: 141 mmol/L (ref 135–145)

## 2023-08-04 LAB — LIPID PANEL
Cholesterol: 95 mg/dL (ref 0–200)
HDL: 46 mg/dL (ref >40)
LDL Cholesterol: 32 mg/dL (ref 0–99)
Total CHOL/HDL Ratio: 2.1 {ratio}
Triglycerides: 86 mg/dL (ref <150)
VLDL: 17 mg/dL (ref 0–40)

## 2023-08-04 MED ORDER — TICAGRELOR 90 MG PO TABS: 90.0000 mg | ORAL_TABLET | Freq: Two times a day (BID) | ORAL | 0 refills | Status: AC

## 2023-08-04 MED ORDER — CLOPIDOGREL BISULFATE 75 MG PO TABS: 75.0000 mg | ORAL_TABLET | Freq: Every day | ORAL | 3 refills | Status: DC

## 2023-08-04 NOTE — Progress Notes (Signed)
Patient ID: Jose Meyer, male   DOB: 1946-07-15, 77 y.o.   MRN: [id number] PCP: Dr. Shaune Pollack Cardiology: Dr. Shirlee Latch  77 y.o.with history of prior cerebellar CVA, nonobstructive CAD, HTN, hyperlipidemia presents for followup of CAD, HTN, CVA.  Patient has a history of cerebellar CVA in 2000, documented by MRI.  Symptoms at that time were difficulty with balace and discoordination.  No vertigo at that time.  In 7/10, pt had just gotten dressed for church one Sunday and was not doing anything in particular.  He developed a severe spinning sensation like vertigo that lasted for about 5 minutes.  He was "woozy" for about 1.5 hours after that before finally returning to normal.  He does not remember having the vertigo set off by moving his head in a particular direction.  He went to the ER and had an MRI of his head done, showing only small vessel disease.  There was some concern that this could have been a posterior circulation TIA given prior history of cerebellar CVA.  He did have a TEE in 9/10 showing no PFO, normal LV and RV systolic function, grade III plaque in the aortic arch/descending thoracic aorta.  Patient has known nonobstructive CAD from cath in 2007.  Echo in 9/13 showed normal EF. Cardiolite was done in 2/18, no ischemia (low risk study).    Patient had an inferior STEMI in 11/23 while on vacation in [place], [place].  He reports occasional episodes of lightheadedness with exertion in the weeks leading up to the event but no chest pain.  On the day of the inferior MI, he reports nausea and "indigestion" after dinner that steadily progressed.  He called EMS and was taken to [facility] where inferior STEMI was noted.  He had cath with 99% stenosis proximal RCA treated with DES.  He was noted to be mildly hypotensive and initially received IV fluid.  Later, oxygen saturation was noted to be low and he was given IV Lasix.  Echo showed EF >70% with no WMAs, poor RV visualization.   Echo in 12/23 showed  EF 65-70%, no wall motion abnormalities, normal RV.    He returns today for followup of CAD.  Weight up 4 lbs.  SBP < 130 when he checks at home.  Feels good overall, walking for up to 3 miles without exertional dyspnea or chest pain.  No lightheadedness or palpitations.  Having trouble with left rotator cuff arthritis, will likely need eventual surgery.   ECG (personally reviewed): NSR, normal  Labs (8/10): creatinine 1.0, LDL 73 Labs (9/12): K 4.1, creatinine 0.83, LDL 53, HDL 42, TGs 131 Labs (9/13): K 4.1, creatinine 0.9, TSH normal Labs (12/15): LDL 62, HDL 51, LFTs normal, K 4, creatinine 0.98 Labs (12/17): K 3.6, creatinine 0.96, hgb 17.1, LDL 53, HDL 67 Labs (1/19): LDL 54, HDL 50 Labs (7/19): K 4.2, creatinine 0.88 Labs (9/19): LDL 59, HDL 47 Labs (9/20): LDL 53, TGs 150, HDL 50, K 3.7, creatinine 7.82 Labs (10/21): LDL 44, TGs 172, hgb 16.8, K 3.9, creatinine 1.04 Labs (11/23): K 3.5, creatinine 1.1, LDL 62, BNP 21, Lp(a) 85 Labs (1/24): LDL 27 Labs (5/24): K 3.8, creatinine 0.96  Allergies (verified):  No Known Drug Allergies  Past Medical History: 1. Hyperlipidemia 2. Cerebellar CVA 2000: documented by MRI.  Possible TIA 7/10 with vertigo.  MRI 7/10 showed only small vessel disease.  Possible TIA 7/10.  Carotid dopplers (9/10) with no significant disease.  TEE (9/10): Normal LV and RV  size and systolic function.  No PFO.  No significant valvular disease.  At least grade III plaque in the arch and descending thoracic aorta with no ulceration or mobility.  3. HTN 4. History of colon CA s/p R hemicolectomy in 9/09.  5. psoriasis 6. diverticulosis 7. CAD: nonobstructive.  LHC in 2000 without significant disease.  ETT-cardiolite 2007 with fixed inferior defect.  LHC (4/07) showed EF 60%, 30-40% CFX stenosis, 30-40% mRCA stenosis.  Probably false positive cardiolite.  Echo (9/13): EF 55-60%, mild LVH, grade I diastolic dysfunction, mild to moderate LAE. - Cardiolite (2/18):  Medium-sized, fixed inferolateral perfusion defect (likely artifact given normal wall motion), no ischemia, EF 64%.  - Inferior STEMI (11/23) with 99% proximal RCA treated with DES. Echo in 11/23 with EF > 70%, no WMAs, RV poorly visualized.  8. Palpitations: PVCs.  30 day event monitor in 9/13 with occasional PVCs but no more significant arrhythmias.   9. Chronic low back pain.  10. Plantar fasciitis 11. Psoriasis 12. Basal cell carcinoma  Family History: No premature CAD.   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  No smoking since 1975.  Occasional ETOH.  Married with 2 children [facility], cardiologist in [place]).  Enjoys woodworking.  ROS: All systems reviewed and negative except as per HPI.   Current Outpatient Medications  Medication Sig Dispense Refill   aspirin 81 MG chewable tablet Chew 81 mg by mouth daily.     atorvastatin (LIPITOR) 80 MG tablet Take 1 tablet (80 mg total) by mouth daily. 90 tablet 3   betamethasone dipropionate 0.05 % lotion Apply 0.05 application topically as needed. For the scalp after shower  0   [START ON 09/14/2023] clopidogrel (PLAVIX) 75 MG tablet Take 1 tablet (75 mg total) by mouth daily. 90 tablet 3   diclofenac Sodium (VOLTAREN) 1 % GEL 1 application     ELIDEL 1 % cream Apply 1 application topically as needed. For skin  0   fluorometholone (FML) 0.1 % ophthalmic suspension INT 1 GTT INTO OU UTD PRN  4   fluticasone (FLONASE) 50 MCG/ACT nasal spray Place 1 spray into both nostrils as needed for allergies or rhinitis.     furosemide (LASIX) 20 MG tablet Take 1 tablet (20 mg total) by mouth daily as needed. 30 tablet 11   hydrochlorothiazide (HYDRODIURIL) 25 MG tablet 1 tablet in the morning 90 tablet 1   losartan (COZAAR) 25 MG tablet Take 1 tablet (25 mg total) by mouth daily. 90 tablet 1   methocarbamol (ROBAXIN) 500 MG tablet TK 1 T PO Q 6 H PRF BACK SPASMS      metoprolol succinate (TOPROL-XL) 100 MG 24 hr tablet Take 0.5 tablets (50 mg total) by mouth every morning AND 1 tablet (100 mg total) every evening. Take with or immediately following a meal.. 135 tablet 1   SYSTANE ULTRA 0.4-0.3 % SOLN      ticagrelor (BRILINTA) 90 MG TABS tablet Take 1 tablet (90 mg total) by mouth 2 (two) times daily. Stopping on 09/13/23, 80 tablet 0   No current facility-administered medications for this encounter.    BP 130/80   Pulse 65   Wt 95.3 kg (210 lb)   SpO2 95%   BMI 28.48 kg/m  General: NAD Neck: No JVD, no thyromegaly or thyroid nodule.  Lungs: Clear to auscultation bilaterally with normal respiratory effort. CV: Nondisplaced PMI.  Heart regular S1/S2, no S3/S4, no murmur.  1+ right ankle edema (chronic asymmetry).  No carotid bruit.  Normal pedal pulses.  Abdomen: Soft, nontender, no hepatosplenomegaly, no distention.  Skin: Intact without lesions or rashes.  Neurologic: Alert and oriented x 3.  Psych: Normal affect. Extremities: No clubbing or cyanosis.  HEENT: Normal.   Assessment/Plan: 1. CAD: Inferior STEMI in 11/23 treated with DES to 99% stenosis proximal RCA, nonobstructive disease in LAD and LCx.  Based on history, suspect he may have had some RV involvement (hypotension initially requiring IV fluid then hypoxemic and treated with Lasix). Echo at [facility] in 11/23 showed EF > 70% with no WMAs, but RV not well-visualized.  Echo here in 12/23 showed EF 65-70%, normal RV.  No exertional symptoms.   - Continue ASA 81 + ticagrelor 90 bid until the end of November.  In 12/24, I will stop ticagrelor and start Plavix 75 mg daily (1 year post-MI).  I will continue ASA 81 (he was on ASA 81 when he had his MI, so long-term regimen will be ASA 81 + Plavix).  - Continue atorvastatin 80 daily, check lipids today.  - Continue exercise.  2. Hyperlipidemia: Goal LDL < 55. Atorvastatin increased after STEMI. Of note, Lp(a) was mildly elevated at 85. Great LDL in  1/24 (27).   - Check lipids today.  3. HTN: BP is well-controlled, SBP 110s-120s.  - Continue Toprol XL - Continue losartan 25 daily - Continue HCTZ 25 mg daily. BMET today.  4. CVA: H/o CVA/TIA in the remote past. No recent neurological symptoms.   Followup 6 months.   Marca Ancona 08/04/2023

## 2023-08-04 NOTE — Patient Instructions (Signed)
STOP Brilinta on 09/13/23.  START Plavix 75 mg daily on 09/14/23.  Labs done today, your results will be available in MyChart, we will contact you for abnormal readings.  Your physician recommends that you schedule a follow-up appointment in: 6 months ( April 2025) ** PLEASE CALL THE OFFICE IN FEBRUARY 2025 TO ARRANGE YOUR FOLLOW UP APPOINTMENT. **  If you have any questions or concerns before your next appointment please send [place] a message through [place] or call our office at [phone].    TO LEAVE A MESSAGE FOR THE NURSE SELECT OPTION 2, PLEASE LEAVE A MESSAGE INCLUDING: YOUR NAME DATE OF BIRTH CALL BACK NUMBER REASON FOR CALL**this is important as we prioritize the call backs  YOU WILL RECEIVE A CALL BACK THE SAME DAY AS LONG AS YOU CALL BEFORE 4:00 PM  At the [facility], you and your health needs are our priority. As part of our continuing mission to provide you with exceptional heart care, we have created designated Provider Care Teams. These Care Teams include your primary Cardiologist (physician) and Advanced Practice Providers (APPs- Physician Assistants and Nurse Practitioners) who all work together to provide you with the care you need, when you need it.   You may see any of the following providers on your designated Care Team at your next follow up: Dr Arvilla Meres Dr Marca Ancona Dr. Dorthula Nettles Dr. Clearnce Hasten Amy Filbert Schilder, NP Robbie Lis, [place] [facility] [place], [place] Brynda Peon, NP [place] Lee, NP Karle Plumber, PharmD   Please be sure to bring in all your medications bottles to every appointment.    Thank you for choosing [facility]

## 2023-08-17 ENCOUNTER — Encounter (HOSPITAL_COMMUNITY): Payer: Self-pay | Admitting: Cardiology

## 2023-08-19 NOTE — Telephone Encounter (Signed)
noted 

## 2023-08-20 ENCOUNTER — Other Ambulatory Visit: Payer: Self-pay | Admitting: Orthopaedic Surgery

## 2023-08-20 DIAGNOSIS — G8929 Other chronic pain: Secondary | ICD-10-CM

## 2023-08-22 ENCOUNTER — Other Ambulatory Visit (HOSPITAL_COMMUNITY): Payer: Self-pay | Admitting: Family Medicine

## 2023-08-22 ENCOUNTER — Encounter (HOSPITAL_COMMUNITY): Payer: Self-pay | Admitting: Cardiology

## 2023-08-22 ENCOUNTER — Ambulatory Visit (HOSPITAL_COMMUNITY)
Admission: RE | Admit: 2023-08-22 | Discharge: 2023-08-22 | Disposition: A | Discharge status: Home / Self Care | Payer: Medicare PPO | Source: Ambulatory Visit | Attending: Family Medicine | Admitting: Family Medicine

## 2023-08-22 DIAGNOSIS — M79661 Pain in right lower leg: Secondary | ICD-10-CM

## 2023-08-22 DIAGNOSIS — M7989 Other specified soft tissue disorders: Secondary | ICD-10-CM | POA: Diagnosis not present

## 2023-08-22 DIAGNOSIS — I872 Venous insufficiency (chronic) (peripheral): Secondary | ICD-10-CM | POA: Diagnosis not present

## 2023-08-22 NOTE — Progress Notes (Signed)
Right lower extremity venous duplex has been completed. Preliminary results can be found in CV Proc through chart review.  Results were given to Dr. Melba Coon.  08/22/23 1:48 PM Olen Cordial RVT

## 2023-08-27 ENCOUNTER — Encounter (HOSPITAL_COMMUNITY): Payer: Self-pay | Admitting: Cardiology

## 2023-08-27 DIAGNOSIS — I5022 Chronic systolic (congestive) heart failure: Secondary | ICD-10-CM

## 2023-08-29 ENCOUNTER — Ambulatory Visit: Payer: Medicare PPO | Admitting: Podiatry

## 2023-08-29 ENCOUNTER — Encounter: Payer: Self-pay | Admitting: Podiatry

## 2023-08-29 VITALS — Ht 72.0 in | Wt 210.0 lb

## 2023-08-29 DIAGNOSIS — D689 Coagulation defect, unspecified: Secondary | ICD-10-CM

## 2023-08-29 DIAGNOSIS — M79674 Pain in right toe(s): Secondary | ICD-10-CM

## 2023-08-29 DIAGNOSIS — B351 Tinea unguium: Secondary | ICD-10-CM | POA: Diagnosis not present

## 2023-08-29 DIAGNOSIS — M79675 Pain in left toe(s): Secondary | ICD-10-CM

## 2023-09-03 ENCOUNTER — Encounter: Payer: Self-pay | Admitting: Podiatry

## 2023-09-03 ENCOUNTER — Ambulatory Visit
Admission: RE | Admit: 2023-09-03 | Discharge: 2023-09-03 | Disposition: A | Discharge status: Home / Self Care | Payer: Medicare PPO | Source: Ambulatory Visit | Attending: Orthopaedic Surgery | Admitting: Orthopaedic Surgery

## 2023-09-03 DIAGNOSIS — M25412 Effusion, left shoulder: Secondary | ICD-10-CM | POA: Diagnosis not present

## 2023-09-03 DIAGNOSIS — M87022 Idiopathic aseptic necrosis of left humerus: Secondary | ICD-10-CM | POA: Diagnosis not present

## 2023-09-03 DIAGNOSIS — G8929 Other chronic pain: Secondary | ICD-10-CM

## 2023-09-03 DIAGNOSIS — S46012A Strain of muscle(s) and tendon(s) of the rotator cuff of left shoulder, initial encounter: Secondary | ICD-10-CM | POA: Diagnosis not present

## 2023-09-03 DIAGNOSIS — M25512 Pain in left shoulder: Secondary | ICD-10-CM | POA: Diagnosis not present

## 2023-09-03 NOTE — Progress Notes (Signed)
  Subjective:  Patient ID: Jose Meyer, male    DOB: 1946-01-14,  MRN: [id number]  77 y.o. male presents at risk foot care with h/o clotting disorder and callus(es) right foot and painful thick toenails that are difficult to trim. Painful toenails interfere with ambulation. Aggravating factors include wearing enclosed shoe gear. Pain is relieved with periodic professional debridement. Painful calluses are aggravated when weightbearing with and without shoegear. Pain is relieved with periodic professional debridement. Patient states he had an episode of dermatitis right ankle which was treated by PCP who prescribed mild steroid cream. It is getting better. Chief Complaint  Patient presents with   Nail Problem    Pt is here for RFC not a diabetic PCP is Dr Kateri Plummer and LOV was 2 months ago.   New problem(s): None   PCP is Farris Has, MD.  No Known Allergies  Review of Systems: Negative except as noted in the HPI.   Objective:  Jose Meyer is a pleasant 77 y.o. male WD, WN in NAD. AAO x 3.  Vascular Examination: Vascular status intact b/l with palpable pedal pulses. CFT immediate b/l. Pedal hair sparse.  Trace edema. Resolving stasis dermatitis medial aspect right ankle. No pain with calf compression b/l. Skin temperature gradient WNL b/l. Evidence of chronic venous insufficiency b/l LE. No cyanosis or clubbing noted.  Neurological Examination: Sensation grossly intact b/l with 10 gram monofilament. Vibratory sensation intact b/l.  Dermatological Examination: Pedal skin with normal turgor, texture and tone b/l. No open wounds nor interdigital macerations noted. Toenails 1-5 b/l thick, discolored, elongated with subungual debris and pain on dorsal palpation. No hyperkeratotic lesions noted b/l.   Musculoskeletal Examination: Muscle strength 5/5 to b/l LE.  No pain, crepitus noted b/l. No gross pedal deformities. Patient ambulates independently without assistive aids.    Radiographs: None  Last A1c:       No data to display         Assessment:   1. Pain due to onychomycosis of toenails of both feet   2. Clotting disorder (HCC)    Plan:  -Consent given for treatment as described below: -Examined patient. -Continue supportive shoe gear daily. -Toenails 1-5 b/l were debrided in length and girth with sterile nail nippers and dremel without iatrogenic bleeding.  -Patient/POA to call should there be question/concern in the interim.  Return in about 3 months (around 11/29/2023).  [name], DPM      [facility] LOCATION: [address] [place] [address]                   Office [phone]   [facility] LOCATION: [address] [place], [place] [address] Office [phone]

## 2023-09-05 NOTE — Telephone Encounter (Signed)
Order placed

## 2023-09-08 DIAGNOSIS — M25512 Pain in left shoulder: Secondary | ICD-10-CM | POA: Diagnosis not present

## 2023-09-15 DIAGNOSIS — M75102 Unspecified rotator cuff tear or rupture of left shoulder, not specified as traumatic: Secondary | ICD-10-CM | POA: Diagnosis not present

## 2023-09-17 DIAGNOSIS — M75102 Unspecified rotator cuff tear or rupture of left shoulder, not specified as traumatic: Secondary | ICD-10-CM | POA: Diagnosis not present

## 2023-09-17 NOTE — Telephone Encounter (Signed)
Noted  

## 2023-09-23 DIAGNOSIS — M75102 Unspecified rotator cuff tear or rupture of left shoulder, not specified as traumatic: Secondary | ICD-10-CM | POA: Diagnosis not present

## 2023-09-25 DIAGNOSIS — M75102 Unspecified rotator cuff tear or rupture of left shoulder, not specified as traumatic: Secondary | ICD-10-CM | POA: Diagnosis not present

## 2023-09-26 DIAGNOSIS — S29012A Strain of muscle and tendon of back wall of thorax, initial encounter: Secondary | ICD-10-CM | POA: Diagnosis not present

## 2023-09-26 DIAGNOSIS — M545 Low back pain, unspecified: Secondary | ICD-10-CM | POA: Diagnosis not present

## 2023-09-29 DIAGNOSIS — M25512 Pain in left shoulder: Secondary | ICD-10-CM | POA: Diagnosis not present

## 2023-09-29 DIAGNOSIS — M75102 Unspecified rotator cuff tear or rupture of left shoulder, not specified as traumatic: Secondary | ICD-10-CM | POA: Diagnosis not present

## 2023-10-29 ENCOUNTER — Telehealth: Payer: Self-pay | Admitting: Pharmacist Clinician (PhC)/ Clinical Pharmacy Specialist

## 2023-10-29 ENCOUNTER — Encounter: Payer: Self-pay | Admitting: Pharmacist Clinician (PhC)/ Clinical Pharmacy Specialist

## 2023-10-29 ENCOUNTER — Ambulatory Visit
Payer: Medicare PPO | Attending: Cardiovascular Disease | Admitting: Pharmacist Clinician (PhC)/ Clinical Pharmacy Specialist

## 2023-10-29 VITALS — Ht 72.0 in | Wt 217.7 lb

## 2023-10-29 DIAGNOSIS — I6350 Cerebral infarction due to unspecified occlusion or stenosis of unspecified cerebral artery: Secondary | ICD-10-CM

## 2023-10-29 NOTE — Telephone Encounter (Signed)
 Please do PA for Wegovy  - pt diagnosis is prior CVA and MI.

## 2023-10-29 NOTE — Assessment & Plan Note (Signed)
  Patient has not met goal of at least 5% of body weight loss with comprehensive lifestyle modifications alone in the past 3-6 months. Pharmacotherapy is appropriate to pursue as augmentation. Will start Wegovy .  Indicated for use in patients with prior CVA or MI to reduce risk of recurrent events   Confirmed patient has no personal or family history of medullary thyroid carcinoma (MTC) or Multiple Endocrine Neoplasia syndrome type 2 (MEN 2).   Advised patient on common side effects including nausea, diarrhea, dyspepsia, decreased appetite, and fatigue. Counseled patient on reducing meal size and how to titrate medication to minimize side effects. Patient aware to call if intolerable side effects or if experiencing dehydration, abdominal pain, or dizziness. Patient will adhere to dietary modifications and will target at least 150 minutes of moderate intensity exercise weekly.   Injection technique reviewed at today's visit.    Titration Plan:  Will plan to follow the titration plan as below, pending patient is tolerating each dose before increasing to the next. Can slow titration if needed for tolerability.    -Month 1: Inject 0.25 mg SQ once weekly x 4 weeks -Month 2: Inject 0.5 mg SQ once weekly x 4 weeks -Month 3: Inject 1 mg SQ once weekly x 4 weeks -Month 4+: Inject 1.7 mg SQ once weekly   Follow up in 3 months.

## 2023-10-29 NOTE — Progress Notes (Signed)
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 male patient of Dr Shirlee Latch, in the office today to discuss options for weight management.   Patient reports that he was at 244 lbs when he had his STEMI in 2023, and after that worked on weight loss by focusing on healthy choices and portion control.  Was able to get to 210, but hit a plateau and has fluctuated from 210-215 since then.    Current weight management medications: none  Previously tried meds: none  Current meds that may affect weight: none  Baseline weight/BMI:  98.7 kg //  29.53  Insurance payor:  [employer]  Diet: mostly home cooked meals, eats out only once weekly.  No breakfast most day.  Tries to avoid sodium as much as possible, plenty of vegetables and healthy proteins  Exercise: started walking more last summer, eases up at Christmas; getting back to that routine - on treadmill  Confirmed patient has no personal or family history of medullary thyroid carcinoma (MTC) or Multiple Endocrine Neoplasia syndrome type 2 (MEN 2).   Social History:   Tobacco: no  Alcohol: seldom   Accessory Clinical Findings    Lab Results  Component Value Date   CREATININE 0.88 08/04/2023   BUN 16 08/04/2023   NA 141 08/04/2023   K 3.4 (L) 08/04/2023   CL 102 08/04/2023   CO2 27 08/04/2023   Lab Results  Component Value Date   ALT 34 10/28/2022   AST 28 10/28/2022   ALKPHOS 45 10/28/2022   BILITOT 1.1 10/28/2022   No results found for: "HGBA1C"    Home Medications/Allergies    Current Outpatient Medications  Medication Sig Dispense Refill   aspirin  81 MG chewable tablet Chew 81 mg by mouth daily.     atorvastatin (LIPITOR) 80 MG tablet Take 1 tablet (80 mg total) by mouth daily. 90 tablet 3   betamethasone dipropionate 0.05 % lotion Apply 0.05 application topically as needed. For the scalp after shower  0   clopidogrel (PLAVIX) 75 MG tablet Take 1 tablet (75 mg total) by mouth daily. 90 tablet 3   diclofenac Sodium (VOLTAREN) 1 % GEL 1 application     ELIDEL 1 % cream Apply 1 application topically as needed. For skin  0   fluorometholone (FML) 0.1 % ophthalmic suspension INT 1 GTT INTO OU UTD PRN  4   fluticasone (FLONASE) 50 MCG/ACT nasal spray Place 1 spray into both nostrils as needed for allergies or rhinitis.     furosemide (LASIX) 20 MG tablet Take 1 tablet (20 mg total) by mouth daily as needed. 30 tablet 11   hydrochlorothiazide (HYDRODIURIL) 25 MG tablet 1 tablet in the morning 90 tablet 1   losartan (COZAAR) 25 MG tablet Take 1 tablet (25 mg total) by mouth daily. 90 tablet 1   methocarbamol (ROBAXIN) 500 MG tablet TK 1 T PO Q 6 H PRF BACK SPASMS     metoprolol succinate (TOPROL-XL) 100 MG 24 hr tablet Take 0.5  tablets (50 mg total) by mouth every morning AND 1 tablet (100 mg total) every evening. Take with or immediately following a meal.. 135 tablet 1   SYSTANE ULTRA 0.4-0.3 % SOLN      No current facility-administered medications for this visit.     No Known Allergies  Assessment & Plan    Cerebral artery occlusion with cerebral infarction [facility])  Patient has not met goal of at least 5% of body weight loss with comprehensive lifestyle modifications alone in the past 3-6 months. Pharmacotherapy is appropriate to pursue as augmentation. Will start Wegovy.  Indicated for use in patients with prior CVA or MI to reduce risk of recurrent events   Confirmed patient has no personal or family history of medullary thyroid carcinoma (MTC) or Multiple Endocrine Neoplasia syndrome type 2 (MEN 2).   Advised patient on common side  effects including nausea, diarrhea, dyspepsia, decreased appetite, and fatigue. Counseled patient on reducing meal size and how to titrate medication to minimize side effects. Patient aware to call if intolerable side effects or if experiencing dehydration, abdominal pain, or dizziness. Patient will adhere to dietary modifications and will target at least 150 minutes of moderate intensity exercise weekly.   Injection technique reviewed at today's visit.    Titration Plan:  Will plan to follow the titration plan as below, pending patient is tolerating each dose before increasing to the next. Can slow titration if needed for tolerability.    -Month 1: Inject 0.25 mg SQ once weekly x 4 weeks -Month 2: Inject 0.5 mg SQ once weekly x 4 weeks -Month 3: Inject 1 mg SQ once weekly x 4 weeks -Month 4+: Inject 1.7 mg SQ once weekly   Follow up in 3 months.    Phillips Hay PharmD CPP [facility]  [address] [place], [place] [address] [phone]

## 2023-10-29 NOTE — Patient Instructions (Addendum)
 We will start the prior authorization process to get Wegovy  covered by your insurance.   TIPS FOR SUCCESS Write down the reasons why you want to lose weight and post it in a place where you'll see it often. Start small and work your way up. Keep in mind that it takes time to achieve goals, and small steps add up. Any additional movements help to burn calories. Taking the stairs rather than the elevator and parking at the far end of your parking lot are easy ways to start. Brisk walking for at least 30 minutes 4 or more days of the week is an excellent goal to work toward  [employer] WHAT IT MEANS TO FEEL FULL Did you know that it can take 15 minutes or more for your brain to receive the message that you've eaten? That means that, if you eat less food, but consume it slower, you may still feel satisfied. Eating a lot of fruits and vegetables can also help you feel fuller. Eat off of smaller plates so that moderate portions don't seem too small  TITRATION PLAN Will plan to follow the titration plan as below, pending patient is tolerating each dose before increasing to the next. Can slow titration if needed for tolerability.    -Weeks 1-4: Inject 0.25 mg SQ once weekly  -Weeks 5-8: Inject 0.5 mg SQ once weekly  -Weeks 9-12 Inject 1 mg SQ once weekly  -Weeks 13-16: Inject 1.7 mg SQ once weekly   Follow up in 3 months.  If you have any questions or concerns, please reach out to us .  Garris Melhorn/Chris at [phone].  THANK YOU FOR CHOOSING [facility]

## 2023-10-30 ENCOUNTER — Other Ambulatory Visit (HOSPITAL_COMMUNITY): Payer: Self-pay

## 2023-10-30 ENCOUNTER — Telehealth: Payer: Self-pay | Admitting: Pharmacy Technician

## 2023-10-30 NOTE — Telephone Encounter (Signed)
Pharmacy Patient Advocate Encounter   Received notification from Pt Calls Messages that prior authorization for wegovy is required/requested.   Insurance verification completed.   The patient is insured through [employer] .   Per test claim: PA required; PA submitted to above mentioned insurance via CoverMyMeds Key/confirmation #/[id number] Status is pending

## 2023-10-31 ENCOUNTER — Other Ambulatory Visit (HOSPITAL_COMMUNITY): Payer: Self-pay

## 2023-10-31 NOTE — Telephone Encounter (Signed)
Pharmacy Patient Advocate Encounter  Received notification from [facility] that Prior Authorization for wegovy has been APPROVED from 10/30/23 to 10/13/24. Ran test claim, Copay is $100.00 one month. This test claim was processed through [facility]- copay amounts may vary at other pharmacies due to pharmacy/plan contracts, or as the patient moves through the different stages of their insurance plan.   PA #/Case ID/Reference #: [id number]

## 2023-11-03 MED ORDER — WEGOVY 0.5 MG/0.5ML ~~LOC~~ SOAJ
0.5000 mg | SUBCUTANEOUS | 1 refills | Status: DC
Start: 2023-11-03 — End: 2023-12-08

## 2023-11-03 MED ORDER — WEGOVY 0.25 MG/0.5ML ~~LOC~~ SOAJ
0.2500 mg | SUBCUTANEOUS | 1 refills | Status: DC
Start: 2023-11-03 — End: 2023-12-15

## 2023-11-03 MED ORDER — WEGOVY 1 MG/0.5ML ~~LOC~~ SOAJ
1.0000 mg | SUBCUTANEOUS | 1 refills | Status: DC
Start: 2023-11-03 — End: 2023-12-08

## 2023-11-03 NOTE — Addendum Note (Signed)
Addended by: Rosalee Kaufman on: 11/03/2023 12:40 PM   Modules accepted: Orders

## 2023-11-04 ENCOUNTER — Encounter (HOSPITAL_COMMUNITY): Payer: Self-pay | Admitting: Cardiology

## 2023-11-04 MED ORDER — CLOPIDOGREL BISULFATE 75 MG PO TABS: 75.0000 mg | ORAL_TABLET | Freq: Every day | ORAL | 3 refills | Status: DC

## 2023-11-04 MED ORDER — HYDROCHLOROTHIAZIDE 25 MG PO TABS: 25.0000 mg | ORAL_TABLET | Freq: Every day | ORAL | 3 refills | Status: DC

## 2023-11-04 MED ORDER — METOPROLOL SUCCINATE ER 100 MG PO TB24: ORAL_TABLET | ORAL | 3 refills | Status: DC

## 2023-11-17 MED ORDER — LOSARTAN POTASSIUM 25 MG PO TABS: 25.0000 mg | ORAL_TABLET | Freq: Every day | ORAL | 3 refills | Status: DC

## 2023-11-17 MED ORDER — ATORVASTATIN CALCIUM 80 MG PO TABS: 80.0000 mg | ORAL_TABLET | Freq: Every day | ORAL | 3 refills | Status: DC

## 2023-11-17 NOTE — Addendum Note (Signed)
Addended by: Sheela Mcculley, Milagros Reap on: 11/17/2023 11:09 AM   Modules accepted: Orders

## 2023-11-24 DIAGNOSIS — J301 Allergic rhinitis due to pollen: Secondary | ICD-10-CM | POA: Diagnosis not present

## 2023-11-24 DIAGNOSIS — J3081 Allergic rhinitis due to animal (cat) (dog) hair and dander: Secondary | ICD-10-CM | POA: Diagnosis not present

## 2023-11-24 DIAGNOSIS — R499 Unspecified voice and resonance disorder: Secondary | ICD-10-CM | POA: Diagnosis not present

## 2023-11-24 DIAGNOSIS — K219 Gastro-esophageal reflux disease without esophagitis: Secondary | ICD-10-CM | POA: Diagnosis not present

## 2023-11-26 DIAGNOSIS — R197 Diarrhea, unspecified: Secondary | ICD-10-CM | POA: Diagnosis not present

## 2023-11-27 ENCOUNTER — Other Ambulatory Visit: Payer: Self-pay

## 2023-11-27 ENCOUNTER — Encounter (HOSPITAL_BASED_OUTPATIENT_CLINIC_OR_DEPARTMENT_OTHER): Payer: Self-pay | Admitting: Emergency Medicine

## 2023-11-27 ENCOUNTER — Emergency Department (HOSPITAL_BASED_OUTPATIENT_CLINIC_OR_DEPARTMENT_OTHER): Payer: Medicare PPO

## 2023-11-27 ENCOUNTER — Inpatient Hospital Stay (HOSPITAL_BASED_OUTPATIENT_CLINIC_OR_DEPARTMENT_OTHER)
Admission: EM | Admit: 2023-11-27 | Discharge: 2023-12-02 | DRG: 372 | Disposition: A | Discharge status: Home / Self Care | Payer: Medicare PPO | Attending: Internal Medicine | Admitting: Internal Medicine

## 2023-11-27 DIAGNOSIS — A020 Salmonella enteritis: Secondary | ICD-10-CM | POA: Diagnosis not present

## 2023-11-27 DIAGNOSIS — E860 Dehydration: Secondary | ICD-10-CM | POA: Diagnosis present

## 2023-11-27 DIAGNOSIS — K529 Noninfective gastroenteritis and colitis, unspecified: Principal | ICD-10-CM | POA: Diagnosis present

## 2023-11-27 DIAGNOSIS — R935 Abnormal findings on diagnostic imaging of other abdominal regions, including retroperitoneum: Secondary | ICD-10-CM | POA: Diagnosis not present

## 2023-11-27 DIAGNOSIS — I472 Ventricular tachycardia, unspecified: Secondary | ICD-10-CM | POA: Diagnosis present

## 2023-11-27 DIAGNOSIS — R9431 Abnormal electrocardiogram [ECG] [EKG]: Secondary | ICD-10-CM | POA: Diagnosis not present

## 2023-11-27 DIAGNOSIS — Z955 Presence of coronary angioplasty implant and graft: Secondary | ICD-10-CM

## 2023-11-27 DIAGNOSIS — Z85038 Personal history of other malignant neoplasm of large intestine: Secondary | ICD-10-CM | POA: Diagnosis not present

## 2023-11-27 DIAGNOSIS — L409 Psoriasis, unspecified: Secondary | ICD-10-CM | POA: Diagnosis present

## 2023-11-27 DIAGNOSIS — Z1152 Encounter for screening for COVID-19: Secondary | ICD-10-CM

## 2023-11-27 DIAGNOSIS — I4729 Other ventricular tachycardia: Secondary | ICD-10-CM | POA: Diagnosis not present

## 2023-11-27 DIAGNOSIS — E785 Hyperlipidemia, unspecified: Secondary | ICD-10-CM | POA: Diagnosis present

## 2023-11-27 DIAGNOSIS — Z79899 Other long term (current) drug therapy: Secondary | ICD-10-CM | POA: Diagnosis not present

## 2023-11-27 DIAGNOSIS — R42 Dizziness and giddiness: Secondary | ICD-10-CM | POA: Diagnosis not present

## 2023-11-27 DIAGNOSIS — Z8249 Family history of ischemic heart disease and other diseases of the circulatory system: Secondary | ICD-10-CM | POA: Diagnosis not present

## 2023-11-27 DIAGNOSIS — Z23 Encounter for immunization: Secondary | ICD-10-CM

## 2023-11-27 DIAGNOSIS — Z87891 Personal history of nicotine dependence: Secondary | ICD-10-CM

## 2023-11-27 DIAGNOSIS — Z8673 Personal history of transient ischemic attack (TIA), and cerebral infarction without residual deficits: Secondary | ICD-10-CM

## 2023-11-27 DIAGNOSIS — R000 Tachycardia, unspecified: Secondary | ICD-10-CM | POA: Diagnosis not present

## 2023-11-27 DIAGNOSIS — I2510 Atherosclerotic heart disease of native coronary artery without angina pectoris: Secondary | ICD-10-CM | POA: Diagnosis not present

## 2023-11-27 DIAGNOSIS — E872 Acidosis, unspecified: Secondary | ICD-10-CM | POA: Diagnosis present

## 2023-11-27 DIAGNOSIS — I10 Essential (primary) hypertension: Secondary | ICD-10-CM | POA: Diagnosis present

## 2023-11-27 DIAGNOSIS — R61 Generalized hyperhidrosis: Secondary | ICD-10-CM | POA: Diagnosis not present

## 2023-11-27 DIAGNOSIS — R519 Headache, unspecified: Secondary | ICD-10-CM | POA: Diagnosis not present

## 2023-11-27 DIAGNOSIS — R6510 Systemic inflammatory response syndrome (SIRS) of non-infectious origin without acute organ dysfunction: Secondary | ICD-10-CM | POA: Diagnosis not present

## 2023-11-27 DIAGNOSIS — Z7982 Long term (current) use of aspirin: Secondary | ICD-10-CM

## 2023-11-27 DIAGNOSIS — R531 Weakness: Secondary | ICD-10-CM | POA: Diagnosis present

## 2023-11-27 DIAGNOSIS — J9811 Atelectasis: Secondary | ICD-10-CM | POA: Diagnosis not present

## 2023-11-27 DIAGNOSIS — K5730 Diverticulosis of large intestine without perforation or abscess without bleeding: Secondary | ICD-10-CM | POA: Diagnosis not present

## 2023-11-27 DIAGNOSIS — Z7902 Long term (current) use of antithrombotics/antiplatelets: Secondary | ICD-10-CM

## 2023-11-27 DIAGNOSIS — E876 Hypokalemia: Secondary | ICD-10-CM | POA: Diagnosis present

## 2023-11-27 DIAGNOSIS — Z7985 Long-term (current) use of injectable non-insulin antidiabetic drugs: Secondary | ICD-10-CM | POA: Diagnosis not present

## 2023-11-27 DIAGNOSIS — R197 Diarrhea, unspecified: Secondary | ICD-10-CM | POA: Diagnosis not present

## 2023-11-27 LAB — COMPREHENSIVE METABOLIC PANEL
ALT: 40 U/L (ref 0–44)
AST: 54 U/L — ABNORMAL HIGH (ref 15–41)
Albumin: 3.7 g/dL (ref 3.5–5.0)
Alkaline Phosphatase: 44 U/L (ref 38–126)
Anion gap: 12 (ref 5–15)
BUN: 24 mg/dL — ABNORMAL HIGH (ref 8–23)
CO2: 28 mmol/L (ref 22–32)
Calcium: 9.1 mg/dL (ref 8.9–10.3)
Chloride: 96 mmol/L — ABNORMAL LOW (ref 98–111)
Creatinine, Ser: 1.23 mg/dL (ref 0.61–1.24)
GFR, Estimated: 60 mL/min — ABNORMAL LOW (ref >60)
Glucose, Bld: 150 mg/dL — ABNORMAL HIGH (ref 70–99)
Potassium: 3.1 mmol/L — ABNORMAL LOW (ref 3.5–5.1)
Sodium: 136 mmol/L (ref 135–145)
Total Bilirubin: 1.9 mg/dL — ABNORMAL HIGH (ref 0.0–1.2)
Total Protein: 6.7 g/dL (ref 6.5–8.1)

## 2023-11-27 LAB — CBC
HCT: 54.8 % — ABNORMAL HIGH (ref 39.0–52.0)
Hemoglobin: 19 g/dL — ABNORMAL HIGH (ref 13.0–17.0)
MCH: 31.8 pg (ref 26.0–34.0)
MCHC: 34.7 g/dL (ref 30.0–36.0)
MCV: 91.8 fL (ref 80.0–100.0)
Platelets: 146 10*3/uL — ABNORMAL LOW (ref 150–400)
RBC: 5.97 MIL/uL — ABNORMAL HIGH (ref 4.22–5.81)
RDW: 13.9 % (ref 11.5–15.5)
WBC: 5 10*3/uL (ref 4.0–10.5)
nRBC: 0 % (ref 0.0–0.2)

## 2023-11-27 LAB — URINALYSIS, ROUTINE W REFLEX MICROSCOPIC
Bacteria, UA: NONE SEEN
Bilirubin Urine: NEGATIVE
Glucose, UA: NEGATIVE mg/dL
Ketones, ur: NEGATIVE mg/dL
Leukocytes,Ua: NEGATIVE
Nitrite: NEGATIVE
Protein, ur: 30 mg/dL — AB
Specific Gravity, Urine: >1.046 — ABNORMAL HIGH (ref 1.005–1.030)
pH: 7 (ref 5.0–8.0)

## 2023-11-27 LAB — RESP PANEL BY RT-PCR (RSV, FLU A&B, COVID)  RVPGX2
Influenza A by PCR: NEGATIVE
Influenza B by PCR: NEGATIVE
Resp Syncytial Virus by PCR: NEGATIVE
SARS Coronavirus 2 by RT PCR: NEGATIVE

## 2023-11-27 LAB — MAGNESIUM: Magnesium: 1.8 mg/dL (ref 1.7–2.4)

## 2023-11-27 LAB — LACTIC ACID, PLASMA
Lactic Acid, Venous: 2.1 mmol/L (ref 0.5–1.9)
Lactic Acid, Venous: 1.7 mmol/L (ref 0.5–1.9)

## 2023-11-27 LAB — LIPASE, BLOOD: Lipase: <10 U/L — ABNORMAL LOW (ref 11–51)

## 2023-11-27 MED ORDER — IOHEXOL 300 MG/ML  SOLN
100.0000 mL | Freq: Once | INTRAMUSCULAR | Status: AC | PRN
  Administered 2023-11-27: 100 mL via INTRAVENOUS

## 2023-11-27 MED ORDER — POTASSIUM CHLORIDE CRYS ER 20 MEQ PO TBCR
40.0000 meq | EXTENDED_RELEASE_TABLET | Freq: Once | ORAL | Status: AC
  Administered 2023-11-27: 40 meq via ORAL
  Filled 2023-11-27: qty 2

## 2023-11-27 MED ORDER — METRONIDAZOLE 500 MG/100ML IV SOLN
500.0000 mg | Freq: Once | INTRAVENOUS | Status: AC
  Administered 2023-11-27: 500 mg via INTRAVENOUS
  Filled 2023-11-27: qty 100

## 2023-11-27 MED ORDER — LACTATED RINGERS IV BOLUS
1000.0000 mL | Freq: Once | INTRAVENOUS | Status: AC
  Administered 2023-11-27: 1000 mL via INTRAVENOUS

## 2023-11-27 MED ORDER — SODIUM CHLORIDE 0.9 % IV SOLN
1.0000 g | Freq: Once | INTRAVENOUS | Status: AC
  Administered 2023-11-27: 1 g via INTRAVENOUS
  Filled 2023-11-27: qty 10

## 2023-11-27 MED ORDER — SODIUM CHLORIDE 0.9 % IV SOLN: Freq: Once | INTRAVENOUS | Status: AC

## 2023-11-27 MED ORDER — LACTATED RINGERS IV BOLUS
500.0000 mL | Freq: Once | INTRAVENOUS | Status: AC
  Administered 2023-11-27: 500 mL via INTRAVENOUS

## 2023-11-27 MED ORDER — ONDANSETRON HCL 4 MG/2ML IJ SOLN
4.0000 mg | Freq: Once | INTRAMUSCULAR | Status: AC
  Administered 2023-11-27: 4 mg via INTRAVENOUS
  Filled 2023-11-27: qty 2

## 2023-11-27 MED ORDER — METOPROLOL SUCCINATE ER 25 MG PO TB24
50.0000 mg | ORAL_TABLET | Freq: Once | ORAL | Status: AC
  Administered 2023-11-27: 50 mg via ORAL
  Filled 2023-11-27: qty 2

## 2023-11-27 NOTE — ED Notes (Signed)
Pt attempted to provide urine sample but unable to at this time. Pt had x1 BM, reports diarrhea.

## 2023-11-27 NOTE — Progress Notes (Signed)
Plan of Care Note for accepted transfer   Patient: Jose Meyer MRN: [id number]   DOA: 11/27/2023  Facility requesting transfer: [facility]. Requesting Provider: Dr. Marlene Bast. Reason for transfer: Diffuse colitis.  Diarrhea.  Dehydration. Facility course: 78 year old male with history of CAD CVA hypertension has been having persistent nausea with diarrhea watery in nature for the last 4 days.  CT scan shows diffuse colitis.  Patient was initially tachycardic improved with fluids and restarting his home dose of metoprolol.  Admitted for further observation.  Stool studies are pending.  Plan of care: The patient is accepted for admission to Telemetry unit, at [facility]..  Author: Eduard Clos, MD 11/27/2023  Check [email or web address] for on-call coverage.  Nursing staff, Please call [facility] Admits & Consults System-Wide number on Amion as soon as patient's arrival, so appropriate admitting provider can evaluate the pt.

## 2023-11-27 NOTE — ED Triage Notes (Signed)
Diarrhea since Monday MD told pt he had elevated heart rate (was being seen for diarrhea).  Headache, unsteady gait,

## 2023-11-27 NOTE — ED Provider Notes (Signed)
 [facility] AT [facility] Provider Note   CSN: [id number] Arrival date & time: 11/27/23  1121     History  Chief Complaint  Patient presents with   Weakness    Jose Meyer is a 78 y.o. male.  This is a 78 year old male presenting emergency department for generalized malaise.  Reports symptoms started on Monday, generalized malaise, decreased appetite.  Some nausea, but no vomiting.  Numerous episodes of watery diarrhea and also with headache.   Weakness      Home Medications Prior to Admission medications   Medication Sig Start Date End Date Taking? Authorizing Provider  aspirin 81 MG chewable tablet Chew 81 mg by mouth daily.   Yes [provider]  atorvastatin (LIPITOR) 80 MG tablet Take 1 tablet (80 mg total) by mouth daily. 11/17/23  Yes Laurey Morale, MD  betamethasone dipropionate 0.05 % lotion Apply 0.05 application topically as needed. For the scalp after shower 09/21/14  Yes [provider]  clopidogrel (PLAVIX) 75 MG tablet Take 1 tablet (75 mg total) by mouth daily. 11/04/23  Yes Laurey Morale, MD  diclofenac Sodium (VOLTAREN) 1 % GEL 1 application   Yes [provider]  ELIDEL 1 % cream Apply 1 application topically as needed. For skin 09/20/14  Yes [provider]  fluticasone (FLONASE) 50 MCG/ACT nasal spray Place 1 spray into both nostrils as needed for allergies or rhinitis.   Yes [provider]  furosemide (LASIX) 20 MG tablet Take 1 tablet (20 mg total) by mouth daily as needed. 04/22/23  Yes Bensimhon, Bevelyn Buckles, MD  hydrochlorothiazide (HYDRODIURIL) 25 MG tablet Take 1 tablet (25 mg total) by mouth daily. 1 tablet in the morning 11/04/23  Yes Laurey Morale, MD  loperamide (IMODIUM A-D) 2 MG tablet Take 2 mg by mouth 4 (four) times daily as needed for diarrhea or loose stools.   Yes [provider]  losartan (COZAAR) 25 MG tablet Take 1 tablet (25 mg total) by mouth  daily. 11/17/23  Yes Laurey Morale, MD  methocarbamol (ROBAXIN) 500 MG tablet TK 1 T PO Q 6 H PRF BACK SPASMS 06/22/19  Yes [provider]  metoprolol succinate (TOPROL-XL) 100 MG 24 hr tablet Take 0.5 tablets (50 mg total) by mouth every morning AND 1 tablet (100 mg total) every evening. Take with or immediately following a meal.. 11/04/23  Yes Laurey Morale, MD  Semaglutide-Weight Management (WEGOVY) 0.25 MG/0.5ML SOAJ Inject 0.25 mg into the skin once a week. FOR 4 WEEKS 11/03/23  Yes Laurey Morale, MD  SYSTANE ULTRA 0.4-0.3 % SOLN  07/26/19  Yes [provider]  Semaglutide-Weight Management (WEGOVY) 0.5 MG/0.5ML SOAJ Inject 0.5 mg into the skin once a week. FOR 4 WEEKS WHEN YOU FINISH THE 0.25 MG 11/03/23   Laurey Morale, MD  Semaglutide-Weight Management [facility]) 1 MG/0.5ML SOAJ Inject 1 mg into the skin once a week. WHEN YOU FINISH THE 0.5 MG 11/03/23   Laurey Morale, MD      Allergies    Patient has no known allergies.    Review of Systems   Review of Systems  Neurological:  Positive for weakness.    Physical Exam Updated Vital Signs BP 105/70   Pulse (!) 113   Temp 98.5 F (36.9 C)   Resp (!) 23   Wt 96.2 kg   SpO2 92%   BMI 28.75 kg/m  Physical Exam Vitals reviewed.  Constitutional:  General: He is not in acute distress.    Appearance: He is not toxic-appearing.  HENT:     Head: Normocephalic.     Nose: Nose normal.     Mouth/Throat:     Mouth: Mucous membranes are dry.  Eyes:     Conjunctiva/sclera: Conjunctivae normal.  Cardiovascular:     Rate and Rhythm: Regular rhythm. Tachycardia present.  Pulmonary:     Effort: Pulmonary effort is normal.     Breath sounds: Normal breath sounds.  Abdominal:     General: Abdomen is flat. There is no distension.     Palpations: Abdomen is soft.     Tenderness: There is abdominal tenderness. There is no guarding or rebound.  Musculoskeletal:        General: Normal range of motion.      Cervical back: Normal range of motion.  Skin:    General: Skin is warm and dry.     Capillary Refill: Capillary refill takes less than 2 seconds.  Neurological:     General: No focal deficit present.     Mental Status: He is alert and oriented to person, place, and time.     Cranial Nerves: No cranial nerve deficit.     Sensory: No sensory deficit.     Motor: No weakness.     Coordination: Coordination normal.  Psychiatric:        Mood and Affect: Mood normal.        Behavior: Behavior normal.     ED Results / Procedures / Treatments   Labs (all labs ordered are listed, but only abnormal results are displayed) Labs Reviewed  CBC - Abnormal; Notable for the following components:      Result Value   RBC 5.97 (*)    Hemoglobin 19.0 (*)    HCT 54.8 (*)    Platelets 146 (*)    All other components within normal limits  COMPREHENSIVE METABOLIC PANEL - Abnormal; Notable for the following components:   Potassium 3.1 (*)    Chloride 96 (*)    Glucose, Bld 150 (*)    BUN 24 (*)    AST 54 (*)    Total Bilirubin 1.9 (*)    GFR, Estimated 60 (*)    All other components within normal limits  LIPASE, BLOOD - Abnormal; Notable for the following components:   Lipase <10 (*)    All other components within normal limits  LACTIC ACID, PLASMA - Abnormal; Notable for the following components:   Lactic Acid, Venous 2.1 (*)    All other components within normal limits  RESP PANEL BY RT-PCR (RSV, FLU A&B, COVID)  RVPGX2  CULTURE, BLOOD (ROUTINE X 2)  CULTURE, BLOOD (ROUTINE X 2)  MAGNESIUM  LACTIC ACID, PLASMA  URINALYSIS, ROUTINE W REFLEX MICROSCOPIC    EKG EKG Interpretation Date/Time:  Thursday November 27 2023 11:52:37 EST Ventricular Rate:  141 PR Interval:  138 QRS Duration:  78 QT Interval:  276 QTC Calculation: 422 R Axis:   -39  Text Interpretation: Sinus tachycardia with Premature atrial complexes Left axis deviation Nonspecific ST abnormality Abnormal ECG When compared  with ECG of 04-Aug-2023 08:50, Premature atrial complexes are now Present Vent. rate has increased BY  76 BPM QRS axis Shifted left T wave inversion no longer evident in Inferior leads Confirmed by Estanislado Pandy [phone]) on 11/27/2023 3:31:50 PM  Radiology No results found.  Procedures Procedures    Medications Ordered in ED Medications  lactated ringers bolus 500 mL (  has no administration in time range)  lactated ringers bolus 1,000 mL (0 mLs Intravenous Stopped 11/27/23 1359)  ondansetron (ZOFRAN) injection 4 mg (4 mg Intravenous Given 11/27/23 1358)  metoprolol succinate (TOPROL-XL) 24 hr tablet 50 mg (50 mg Oral Given 11/27/23 1357)  iohexol (OMNIPAQUE) 300 MG/ML solution 100 mL (100 mLs Intravenous Contrast Given 11/27/23 1412)    ED Course/ Medical Decision Making/ A&P Clinical Course as of 11/27/23 1542  Thu Nov 27, 2023  1157 Normal echo 09/16/22 per chart review.  [TY]  1214 Saw cardiology 115/2025; appears to have started [facility] shortly thereafter per chart review [TY]  1409 Lactic acid, plasma(!!) Elevation.  Suspect secondary to volume status rather than overt infection. [TY]  1410 CBC(!) No leukocytosis to suggest stomach infection. [TY]  1410 Magnesium: 1.8 Not low. [TY]  1410 Resp panel by RT-PCR (RSV, Flu A&B, Covid) Anterior Nasal Swab Flu/COVID/RSV negative [TY]    Clinical Course User Index [TY] Coral Spikes, DO                                 Medical Decision Making This is a 78 year old male presenting emergency department with viral syndrome type complaints with generalized malaise, nausea vomiting diarrhea.  Elevated heart rate.  Appears to be sinus tachycardia.  Hemodynamically stable.  No fever or oxygen requirements.  On exam did have some mild tenderness to right side of abdomen.  Clinically appears dehydrated.  Receiving IV fluids.  He does take metoprolol, but has not taken since Monday secondary to his nausea.  Some minor sign of dehydration on his CMP.   Receiving IV fluids.  Pancreatitis unlikely.  Magnesium not low.  Flu/COVID/RSV ordered given his constipation symptoms which sound viral.  They were negative.  UA chest x-ray pending.  Given degree of tenderness in abdomen will get CT scan to evaluate for acute pathology.  Care signed out to afternoon team disposition pending repeat CT scan and reevaluation and normalization of heart rate.  Amount and/or Complexity of Data Reviewed Independent Historian:     Details: Wife notes he is not taking any of his medications for the past day or 2 External Data Reviewed:     Details: See ED course; recently started on Wegovy Labs: ordered. Decision-making details documented in ED Course. Radiology: ordered.  Risk Prescription drug management.         Final Clinical Impression(s) / ED Diagnoses Final diagnoses:  None    Rx / DC Orders ED Discharge Orders     None         Coral Spikes, DO 11/27/23 1542

## 2023-11-27 NOTE — ED Notes (Signed)
Pt SpO2 83% on room air. SpO2 continuously dropping as pt falls asleep. RT notified.

## 2023-11-27 NOTE — ED Provider Notes (Signed)
  Physical Exam  BP 121/77   Pulse 99   Temp 97.7 F (36.5 C) (Oral)   Resp 10   Wt 96.2 kg   SpO2 95%   BMI 28.75 kg/m   Physical Exam Benign abdominal exam, resting comfortably.  Procedures  Procedures  ED Course / MDM   Clinical Course as of 11/27/23 1701  Thu Nov 27, 2023  1157 Normal echo 09/16/22 per chart review.  [TY]  1214 Saw cardiology 115/2025; appears to have started [facility] shortly thereafter per chart review [TY]  1409 Lactic acid, plasma(!!) Elevation.  Suspect secondary to volume status rather than overt infection. [TY]  1410 CBC(!) No leukocytosis to suggest stomach infection. [TY]  1410 Magnesium: 1.8 Not low. [TY]  1410 Resp panel by RT-PCR (RSV, Flu A&B, Covid) Anterior Nasal Swab Flu/COVID/RSV negative [TY]  1614 Pending CT scan, suspect viral syndrome, hx HTN, CAD, HLD, prior CVA, here with nausea, diarrhea since Monday, weak and shaky, on metop didn't take for several days, got fluids and metop, was sinus tach, belly slightly tender, lactic 2.1 came down, HR improving, follow up CT, HR, CXR.   [JD]    Clinical Course User Index [JD] Laurence Spates, MD [TY] Coral Spikes, DO   Medical Decision Making Amount and/or Complexity of Data Reviewed Labs: ordered. Decision-making details documented in ED Course. Radiology: ordered.  Risk Prescription drug management. Decision regarding hospitalization.   Handoff from prior provider.  Patient is a 78 year old here for not feeling well, diarrhea this week and nausea with decreased p.o. intake.  CT scan shows diffuse colitis.  Plan at handoff was to reassess lactic acid and heart rate which was elevated likely due to missing metoprolol and potential discharge home.  On reassessment he feels well.  Tachycardia is resolved.  On abdominal exam.  CT scan with pancolitis likely infectious given his symptoms but this is already improving and his diarrhea has only happened once today.  No recent antibiotics or  other risk factors for C. difficile especially no leukocytosis.  Lactic acid is normalized.  Will p.o. challenge him here, likely treat with antibiotics.  Unfortunate patient did not tolerate p.o. and has had multiple rounds of diarrhea here.  I am concern for infectious colitis.  Added on GI pathogen panel and C. difficile testing.  He is persistently tachycardic.  Antibiotics ordered, I think he needs admission for rehydration, management of colitis.  Discussed with hospitalist and admitted.  Patient is comfortable with this plan.       Laurence Spates, MD 11/27/23 [id number]

## 2023-11-27 NOTE — ED Notes (Signed)
Pt given crackers and water to PO challenge at this time.

## 2023-11-27 NOTE — ED Notes (Signed)
Blood cultures x2 sets obtained before abx started

## 2023-11-27 NOTE — ED Notes (Signed)
Pt tolerating PO challenge appropriately, has had x2 diarrhea occurrences since eating crackers. Pt reports stool is 'more solid than not' now. EDP notified.

## 2023-11-28 ENCOUNTER — Encounter (HOSPITAL_COMMUNITY): Payer: Self-pay | Admitting: Internal Medicine

## 2023-11-28 DIAGNOSIS — Z79899 Other long term (current) drug therapy: Secondary | ICD-10-CM | POA: Diagnosis not present

## 2023-11-28 DIAGNOSIS — R9431 Abnormal electrocardiogram [ECG] [EKG]: Secondary | ICD-10-CM

## 2023-11-28 DIAGNOSIS — I10 Essential (primary) hypertension: Secondary | ICD-10-CM | POA: Diagnosis present

## 2023-11-28 DIAGNOSIS — A020 Salmonella enteritis: Secondary | ICD-10-CM | POA: Diagnosis present

## 2023-11-28 DIAGNOSIS — Z85038 Personal history of other malignant neoplasm of large intestine: Secondary | ICD-10-CM | POA: Diagnosis not present

## 2023-11-28 DIAGNOSIS — Z7985 Long-term (current) use of injectable non-insulin antidiabetic drugs: Secondary | ICD-10-CM | POA: Diagnosis not present

## 2023-11-28 DIAGNOSIS — I472 Ventricular tachycardia, unspecified: Secondary | ICD-10-CM | POA: Diagnosis present

## 2023-11-28 DIAGNOSIS — K529 Noninfective gastroenteritis and colitis, unspecified: Principal | ICD-10-CM | POA: Diagnosis present

## 2023-11-28 DIAGNOSIS — I4729 Other ventricular tachycardia: Secondary | ICD-10-CM | POA: Diagnosis not present

## 2023-11-28 DIAGNOSIS — L409 Psoriasis, unspecified: Secondary | ICD-10-CM | POA: Diagnosis present

## 2023-11-28 DIAGNOSIS — Z7982 Long term (current) use of aspirin: Secondary | ICD-10-CM | POA: Diagnosis not present

## 2023-11-28 DIAGNOSIS — E876 Hypokalemia: Secondary | ICD-10-CM

## 2023-11-28 DIAGNOSIS — Z7902 Long term (current) use of antithrombotics/antiplatelets: Secondary | ICD-10-CM | POA: Diagnosis not present

## 2023-11-28 DIAGNOSIS — E785 Hyperlipidemia, unspecified: Secondary | ICD-10-CM | POA: Diagnosis present

## 2023-11-28 DIAGNOSIS — Z8673 Personal history of transient ischemic attack (TIA), and cerebral infarction without residual deficits: Secondary | ICD-10-CM | POA: Diagnosis not present

## 2023-11-28 DIAGNOSIS — Z23 Encounter for immunization: Secondary | ICD-10-CM | POA: Diagnosis present

## 2023-11-28 DIAGNOSIS — E860 Dehydration: Secondary | ICD-10-CM | POA: Diagnosis present

## 2023-11-28 DIAGNOSIS — R531 Weakness: Secondary | ICD-10-CM | POA: Diagnosis present

## 2023-11-28 DIAGNOSIS — Z1152 Encounter for screening for COVID-19: Secondary | ICD-10-CM | POA: Diagnosis not present

## 2023-11-28 DIAGNOSIS — I2510 Atherosclerotic heart disease of native coronary artery without angina pectoris: Secondary | ICD-10-CM

## 2023-11-28 DIAGNOSIS — Z8249 Family history of ischemic heart disease and other diseases of the circulatory system: Secondary | ICD-10-CM | POA: Diagnosis not present

## 2023-11-28 DIAGNOSIS — Z87891 Personal history of nicotine dependence: Secondary | ICD-10-CM | POA: Diagnosis not present

## 2023-11-28 DIAGNOSIS — Z955 Presence of coronary angioplasty implant and graft: Secondary | ICD-10-CM | POA: Diagnosis not present

## 2023-11-28 DIAGNOSIS — E872 Acidosis, unspecified: Secondary | ICD-10-CM | POA: Diagnosis present

## 2023-11-28 LAB — BLOOD CULTURE ID PANEL (REFLEXED) - BCID2

## 2023-11-28 LAB — GASTROINTESTINAL PANEL BY PCR, STOOL (REPLACES STOOL CULTURE)

## 2023-11-28 LAB — CBC WITH DIFFERENTIAL/PLATELET
Abs Immature Granulocytes: 0.01 10*3/uL (ref 0.00–0.07)
Basophils Absolute: 0 10*3/uL (ref 0.0–0.1)
Basophils Relative: 1 %
Eosinophils Absolute: 0 10*3/uL (ref 0.0–0.5)
Eosinophils Relative: 0 %
HCT: 46.2 % (ref 39.0–52.0)
Hemoglobin: 16.2 g/dL (ref 13.0–17.0)
Immature Granulocytes: 0 %
Lymphocytes Relative: 19 %
Lymphs Abs: 0.9 10*3/uL (ref 0.7–4.0)
MCH: 32.7 pg (ref 26.0–34.0)
MCHC: 35.1 g/dL (ref 30.0–36.0)
MCV: 93.3 fL (ref 80.0–100.0)
Monocytes Absolute: 1.1 10*3/uL — ABNORMAL HIGH (ref 0.1–1.0)
Monocytes Relative: 24 %
Neutro Abs: 2.6 10*3/uL (ref 1.7–7.7)
Neutrophils Relative %: 56 %
Platelets: 130 10*3/uL — ABNORMAL LOW (ref 150–400)
RBC: 4.95 MIL/uL (ref 4.22–5.81)
RDW: 14.1 % (ref 11.5–15.5)
WBC: 4.7 10*3/uL (ref 4.0–10.5)
nRBC: 0 % (ref 0.0–0.2)

## 2023-11-28 LAB — BASIC METABOLIC PANEL
Anion gap: 13 (ref 5–15)
BUN: 15 mg/dL (ref 8–23)
CO2: 26 mmol/L (ref 22–32)
Calcium: 8.6 mg/dL — ABNORMAL LOW (ref 8.9–10.3)
Chloride: 100 mmol/L (ref 98–111)
Creatinine, Ser: 0.96 mg/dL (ref 0.61–1.24)
GFR, Estimated: >60 mL/min (ref >60)
Glucose, Bld: 95 mg/dL (ref 70–99)
Potassium: 4 mmol/L (ref 3.5–5.1)
Sodium: 139 mmol/L (ref 135–145)

## 2023-11-28 LAB — COMPREHENSIVE METABOLIC PANEL
ALT: 34 U/L (ref 0–44)
AST: 41 U/L (ref 15–41)
Albumin: 2.9 g/dL — ABNORMAL LOW (ref 3.5–5.0)
Alkaline Phosphatase: 35 U/L — ABNORMAL LOW (ref 38–126)
Anion gap: 7 (ref 5–15)
BUN: 20 mg/dL (ref 8–23)
CO2: 29 mmol/L (ref 22–32)
Calcium: 8 mg/dL — ABNORMAL LOW (ref 8.9–10.3)
Chloride: 102 mmol/L (ref 98–111)
Creatinine, Ser: 0.89 mg/dL (ref 0.61–1.24)
GFR, Estimated: >60 mL/min (ref >60)
Glucose, Bld: 109 mg/dL — ABNORMAL HIGH (ref 70–99)
Potassium: 3.1 mmol/L — ABNORMAL LOW (ref 3.5–5.1)
Sodium: 138 mmol/L (ref 135–145)
Total Bilirubin: 1.1 mg/dL (ref 0.0–1.2)
Total Protein: 5.3 g/dL — ABNORMAL LOW (ref 6.5–8.1)

## 2023-11-28 LAB — TROPONIN I (HIGH SENSITIVITY)
Troponin I (High Sensitivity): 18 ng/L — ABNORMAL HIGH (ref <18)
Troponin I (High Sensitivity): 6 ng/L (ref <18)

## 2023-11-28 LAB — C DIFFICILE QUICK SCREEN W PCR REFLEX
C Diff antigen: NEGATIVE
C Diff interpretation: NOT DETECTED
C Diff toxin: NEGATIVE

## 2023-11-28 LAB — MAGNESIUM: Magnesium: 1.8 mg/dL (ref 1.7–2.4)

## 2023-11-28 MED ORDER — SODIUM CHLORIDE 0.9 % IV SOLN
2.0000 g | INTRAVENOUS | Status: DC
  Administered 2023-11-28 – 2023-12-02 (×5): 2 g via INTRAVENOUS
  Filled 2023-11-28 (×5): qty 20

## 2023-11-28 MED ORDER — METOPROLOL TARTRATE 5 MG/5ML IV SOLN: 5.0000 mg | Freq: Four times a day (QID) | INTRAVENOUS | Status: DC | PRN

## 2023-11-28 MED ORDER — INFLUENZA VAC A&B SURF ANT ADJ 0.5 ML IM SUSY
0.5000 mL | PREFILLED_SYRINGE | INTRAMUSCULAR | Status: AC
  Administered 2023-12-02: 0.5 mL via INTRAMUSCULAR
  Filled 2023-11-28: qty 0.5

## 2023-11-28 MED ORDER — POTASSIUM CHLORIDE 10 MEQ/100ML IV SOLN
10.0000 meq | INTRAVENOUS | Status: AC
  Administered 2023-11-28 (×4): 10 meq via INTRAVENOUS
  Filled 2023-11-28 (×3): qty 100

## 2023-11-28 MED ORDER — MAGNESIUM SULFATE 2 GM/50ML IV SOLN
2.0000 g | Freq: Once | INTRAVENOUS | Status: AC
  Administered 2023-11-28: 2 g via INTRAVENOUS
  Filled 2023-11-28: qty 50

## 2023-11-28 MED ORDER — SODIUM CHLORIDE 0.9 % IV SOLN: INTRAVENOUS | Status: AC

## 2023-11-28 MED ORDER — POTASSIUM CHLORIDE CRYS ER 20 MEQ PO TBCR
40.0000 meq | EXTENDED_RELEASE_TABLET | Freq: Once | ORAL | Status: AC
  Administered 2023-11-28: 40 meq via ORAL
  Filled 2023-11-28: qty 2

## 2023-11-28 MED ORDER — ACETAMINOPHEN 650 MG RE SUPP: 650.0000 mg | Freq: Four times a day (QID) | RECTAL | Status: DC | PRN

## 2023-11-28 MED ORDER — METOPROLOL SUCCINATE ER 50 MG PO TB24
100.0000 mg | ORAL_TABLET | Freq: Every day | ORAL | Status: DC
  Administered 2023-11-28 – 2023-12-01 (×3): 100 mg via ORAL
  Filled 2023-11-28: qty 4
  Filled 2023-11-28 (×2): qty 2

## 2023-11-28 MED ORDER — LACTATED RINGERS IV BOLUS
1000.0000 mL | Freq: Once | INTRAVENOUS | Status: AC
  Administered 2023-11-28: 1000 mL via INTRAVENOUS

## 2023-11-28 MED ORDER — METOPROLOL SUCCINATE ER 50 MG PO TB24
50.0000 mg | ORAL_TABLET | Freq: Every day | ORAL | Status: DC
  Administered 2023-11-30 – 2023-12-02 (×3): 50 mg via ORAL
  Filled 2023-11-28 (×4): qty 1

## 2023-11-28 MED ORDER — ACETAMINOPHEN 325 MG PO TABS: 650.0000 mg | ORAL_TABLET | Freq: Four times a day (QID) | ORAL | Status: DC | PRN

## 2023-11-28 MED ORDER — ENOXAPARIN SODIUM 40 MG/0.4ML IJ SOSY
40.0000 mg | PREFILLED_SYRINGE | INTRAMUSCULAR | Status: DC
  Filled 2023-11-28 (×2): qty 0.4

## 2023-11-28 NOTE — Consult Note (Addendum)
 Cardiology Consultation   Patient ID: Jose Meyer MRN: [id number]; DOB: 1946-06-25  Admit date: 11/27/2023 Date of Consult: 11/28/2023  PCP:  Farris Has, MD    HeartCare Providers Cardiologist:  None  Advanced Heart Failure:  Marca Ancona, MD  {  Patient Profile:   Jose Meyer is a 78 y.o. male with a history of CAD with inferior STEMI in 08/2022 s/p DES to proximal RCA (at [facility] in [place], [place]), palpitations with occasional PVCs noted on monitor in 2013, hypertension, hyperlipidemia, CVA in 2000, diverticulosis, psoriasis, and colon cancer s/p right hemicolectomy in 06/2008  who is being seen 11/28/2023 for the evaluation of non-sustained VT at the request of Dr. Artis Flock.  History of Present Illness:   Jose Meyer is a 78 year old male with the above history who is followed by Dr. Shirlee Latch. Patient had a inferior STEMI in 08/2022 while on vacation in [place], [place]. LHC at that time showed 99% stenosis of proximal RCA which was treated with DES. Echo showed LVEF of >70 with no regional wall motion abnormalities but RV was poorly visualized. Last Echo in 09/2022 showed LVEF of 65-70% with normal wall motion, normal RV size and function, and no significant valvular disease.  He was last seen by Dr. Shirlee Latch in 07/2023 at which time he was doing well. He was walking up to 3 miles without chest pain or dyspnea. He was started on Wegovy in 10/2023.  Patient presented to the [facility] ED on 11/27/2023 for further evaluation of weakness and diarrhea. Upon arrival to the ED, EKG showed sinus tachycardia, rate 141 bpm, with non-specific ST changes. CT showed new diffuse colonic wall thickening, especially in the descending and sigmoid colon consistent with colitis. There were diffuse colonic diverticulosis but no evidence of focal inflammation to suggestive diverticulitis. WBC was normal but lactic acid was elevated at 2.1 >> 1.7. C. Diff was negative but GI panel was positive  for Salmonella species. Blood cultures positive for staphylococcus species. He was started on IV fluids and antibiotics with improvement in tachycardia.  While waiting in the ED, he was noted to have a 32 beat run of NSVT. High-sensitivity troponin 18 >> 6. Potassium 3.1 and Magnesium 1.8. Repeat EKG showed sinus tachycardia, rate 107 bpm, with non-specific T wave changes. QTc automatically read as 498 ms but I got 481 ms at most on my personal calculation. He was given more IV fluids and electrolytes were repleted. He was transferred to the [facility] ED with plans for admission. Cardiology was consulted for further evaluation of NSVT.  Patient was in his usual state of health until this past Monday 11/24/2023 when he started having profuse diarrhea about every 20 minutes. He tried to stay well hydrated but it was difficult to do because every time he ate/ drank something, he would have diarrhea. This continued all week and had associated weakness. He describes some lightheadedness with as well but no diarrhea. He talked to his PCP who prescribed Imodium but this did not help at all. He went to see his PCP yesterday and was noted to be tachycardic with heart rates in the 130s. PCP recommended going to the ED. He went out to eat after church on Sunday and thinks this is where he got the Salmonella.   He is doing well from a cardiac standpoint. He denies any chest pain, shortness of breath, orthopnea, or PND. He states he has some chronic ankle edema but none recently. No palpitations. No  lightheadedness/ dizziness prior to onset of diarrhea. No syncope. He has lost 8 lbs in the last week.  He has not been taking his medications this week because of his diarrhea. However, prior to this he was taking all of his medications as prescribed.   He was sleeping at the time of the NSVT episode. Reviewed telemetry and no recurrent NSVT. Heart rates in the 100s to 110s but increase to the 130s with minimal  activity.  Past Medical History:  Diagnosis Date   Colon cancer [facility])    s/p R hemicolectomy 06/2008   Coronary artery disease    Non-Obstructive;  LHC 2000 no sig dz; ETT-Cardiolite 2007 with fixed Inf defect;  LHC 4/07:  EF 60%, CFX 30-40%, mRCA 30-40% (prob false + CLite)   CVA (cerebral infarction)    cerebellar CVA 2000: doc by MRI; Poss TIA 7/10 with vertigo; MRI 7/10 => only small vessel disease; Carotids 9/10: no sig disease;  TEE 9/10: normal LV and RV size and systolic fxn, no PFO, no sig valvular dz, at least Gr III plaque in arch and desc thoracic aorta (no ulceration or mobility)   Diverticulosis    H/O echocardiogram    Echo 9/13: EF 55-60%, mod LVH, grade 1 diast dysfn, mild to mod LAE   Hyperlipidemia    Hypertension    Psoriasis     Past Surgical History:  Procedure Laterality Date   KNEE SURGERY Left 07/2014     Home Medications:  Prior to Admission medications   Medication Sig Start Date End Date Taking? Authorizing Provider  aspirin 81 MG chewable tablet Chew 81 mg by mouth daily.   Yes [provider]  atorvastatin (LIPITOR) 80 MG tablet Take 1 tablet (80 mg total) by mouth daily. 11/17/23  Yes Laurey Morale, MD  betamethasone dipropionate 0.05 % lotion Apply 0.05 application topically as needed. For the scalp after shower 09/21/14  Yes [provider]  clopidogrel (PLAVIX) 75 MG tablet Take 1 tablet (75 mg total) by mouth daily. 11/04/23  Yes Laurey Morale, MD  diclofenac Sodium (VOLTAREN) 1 % GEL 1 application   Yes [provider]  ELIDEL 1 % cream Apply 1 application topically as needed. For skin 09/20/14  Yes [provider]  fluticasone (FLONASE) 50 MCG/ACT nasal spray Place 1 spray into both nostrils as needed for allergies or rhinitis.   Yes [provider]  furosemide (LASIX) 20 MG tablet Take 1 tablet (20 mg total) by mouth daily as needed. 04/22/23  Yes Bensimhon, Bevelyn Buckles, MD  hydrochlorothiazide  (HYDRODIURIL) 25 MG tablet Take 1 tablet (25 mg total) by mouth daily. 1 tablet in the morning 11/04/23  Yes Laurey Morale, MD  loperamide (IMODIUM A-D) 2 MG tablet Take 2 mg by mouth 4 (four) times daily as needed for diarrhea or loose stools.   Yes [provider]  losartan (COZAAR) 25 MG tablet Take 1 tablet (25 mg total) by mouth daily. 11/17/23  Yes Laurey Morale, MD  methocarbamol (ROBAXIN) 500 MG tablet TK 1 T PO Q 6 H PRF BACK SPASMS 06/22/19  Yes [provider]  metoprolol succinate (TOPROL-XL) 100 MG 24 hr tablet Take 0.5 tablets (50 mg total) by mouth every morning AND 1 tablet (100 mg total) every evening. Take with or immediately following a meal.. 11/04/23  Yes Laurey Morale, MD  Semaglutide-Weight Management (WEGOVY) 0.25 MG/0.5ML SOAJ Inject 0.25 mg into the skin once a week. FOR 4 WEEKS  11/03/23  Yes Laurey Morale, MD  SYSTANE ULTRA 0.4-0.3 % SOLN  07/26/19  Yes [provider]  Semaglutide-Weight Management (WEGOVY) 0.5 MG/0.5ML SOAJ Inject 0.5 mg into the skin once a week. FOR 4 WEEKS WHEN YOU FINISH THE 0.25 MG 11/03/23   Laurey Morale, MD  Semaglutide-Weight Management [facility]) 1 MG/0.5ML SOAJ Inject 1 mg into the skin once a week. WHEN YOU FINISH THE 0.5 MG 11/03/23   Laurey Morale, MD    Inpatient Medications: Scheduled Meds:  enoxaparin (LOVENOX) injection  40 mg Subcutaneous Q24H   Continuous Infusions:  sodium chloride 100 mL/hr at 11/28/23 1137   cefTRIAXone (ROCEPHIN)  IV Stopped (11/28/23 1134)   PRN Meds: acetaminophen **OR** acetaminophen, metoprolol tartrate  Allergies:   No Known Allergies  Social History:   Social History   Socioeconomic History   Marital status: Married    Spouse name: Not on file   Number of children: Not on file   Years of education: Not on file   Highest education level: Not on file  Occupational History   Not on file  Tobacco Use   Smoking status: Former    Current packs/day: 0.00     Types: Cigarettes    Quit date: 10/14/1973    Years since quitting: 50.1   Smokeless tobacco: Never  Substance and Sexual Activity   Alcohol use: Yes   Drug use: Never   Sexual activity: Not on file  Other Topics Concern   Not on file  Social History Narrative   Not on file   Social Drivers of Health   Financial Resource Strain: Low Risk  (08/31/2022)   [facility]   Overall Physicist, medical Strain (CARDIA)    Difficulty of Paying Living Expenses: Not hard at all  Food Insecurity: No Food Insecurity (08/31/2022)   [facility] [facility]   Hunger Vital Sign    Worried About Programme researcher, broadcasting/film/video in the Last Year: Never true    [employer] of Food in the Last Year: Never true  Transportation Needs: No Transportation Needs (08/31/2022)   Received from [facility], [facility]   PRAPARE - Administrator, Civil Service (Medical): No    Lack of Transportation (Non-Medical): No  Physical Activity: Not on file  Stress: No Stress Concern Present (08/31/2022)   [facility]   [employer] of Occupational Health - Occupational Stress Questionnaire    Feeling of Stress : Not at all  Social Connections: Not on file  Intimate Partner Violence: Not At Risk (08/30/2022)   [facility] from [facility] [place], [facility] [place] [facility]   Abuse Screen    Feels Unsafe at Home or Work/School: no    Feels Threatened by Someone: no    Does Anyone Try to Keep You From Having Contact with Others or Doing Things Outside Your Home?: no    Physical Signs of Abuse Present: no    Family History:   Family History  Problem Relation Age of Onset   Heart disease Father    Heart attack Father      ROS:   Please see the history of present illness.  Review of Systems  Constitutional:  Negative for fever.  HENT:  Positive for congestion.   Respiratory:  Negative for cough and shortness of breath.   Cardiovascular:  Negative for chest pain, palpitations, leg swelling and PND.  Gastrointestinal:  Positive for diarrhea. Negative for blood in stool and vomiting.  Genitourinary:  Negative for hematuria.  Musculoskeletal:  Negative for myalgias.  Neurological:  Positive for dizziness (lightheadedness) and weakness. Negative for loss of consciousness.  Endo/Heme/Allergies:  Does not bruise/bleed easily.  Psychiatric/Behavioral:  Substance abuse: remote tobacco use.     All other ROS reviewed and negative.     Physical Exam/Data:   Vitals:   11/28/23 0915 11/28/23 1130 11/28/23 1314 11/28/23 1330  BP: 131/76 104/67  115/66  Pulse: (!) 105 (!) 108  (!) 107  Resp: 19 (!) 22  (!) 22  Temp: 97.8 F (36.6 C)  97.6 F (36.4 C)   TempSrc: Oral  Oral   SpO2: 100% 100%  98%  Weight:        Intake/Output Summary (Last 24 hours) at 11/28/2023 1403 Last data filed at 11/27/2023 2203 Gross per 24 hour  Intake 701.33 ml  Output --  Net 701.33 ml      11/27/2023   11:49 AM 10/29/2023   10:12 AM 08/29/2023   10:43 AM  Last 3 Weights  Weight (lbs) 212 lb 217 lb 11.2 oz 210 lb  Weight (kg) 96.163 kg 98.748 kg 95.255 kg     Body mass index is 28.75 kg/m.  General: 78 y.o. Caucasian male resting comfortably in no acute distress. HEENT: Normocephalic and atraumatic. Sclera clear.  Neck: Supple. No JVD. Heart: Tachycardic with normal rhythm.  No murmurs, gallops, or rubs.  Lungs: No increased work of breathing. Clear to ausculation bilaterally. No wheezes, rhonchi, or rales.  Abdomen: Soft, non-distended, and non-tender to palpation.   Extremities: No lower extremity edema.    Skin: Warm and dry. Neuro: Alert and oriented x3. No focal deficits. Psych: Normal affect. Responds  appropriately.   EKG:  The EKG was personally reviewed and demonstrates:  Initial EKG showed sinus tachycardia, rate 141 bpm, with non-specific ST changes. Repeat EKG showed sinus tachycardia, rate 107 bpm, with non-specific T wave changes. Telemetry:  Telemetry was personally reviewed and demonstrates:  Sinus tachycardia with rates mostly in the 100s to 110s but increases to 130s with minimal activity.  Relevant CV Studies:  Echocardiogram 09/16/2022: Impressions:  1. Left ventricular ejection fraction, by estimation, is 65 to 70%. The  left ventricle has normal function. The left ventricle has no regional  wall motion abnormalities. Left ventricular diastolic parameters are  indeterminate.   2. Right ventricular systolic function is normal. The right ventricular  size is normal.   3. Left atrial size was mildly dilated.   4. The mitral valve is grossly normal. Trivial mitral valve  regurgitation. No evidence of mitral stenosis.   5. The aortic valve was not well visualized. Aortic valve regurgitation  is not visualized. No aortic stenosis is present.   Comparison(s): No significant change from prior study.   Conclusion(s)/Recommendation(s): Difficult echo windows, but overall  normal LVEF without severe wall motion abnormalities.    Laboratory Data:  High Sensitivity Troponin:   Recent Labs  Lab 11/28/23 0540 11/28/23 0735  TROPONINIHS 18* 6     Chemistry Recent Labs  Lab 11/27/23 1233 11/28/23 0540  NA 136 138  K 3.1* 3.1*  CL 96* 102  CO2 28 29  GLUCOSE 150* 109*  BUN 24* 20  CREATININE 1.23 0.89  CALCIUM 9.1 8.0*  MG 1.8 1.8  GFRNONAA  60* >60  ANIONGAP 12 7    Recent Labs  Lab 11/27/23 1233 11/28/23 0540  PROT 6.7 5.3*  ALBUMIN 3.7 2.9*  AST 54* 41  ALT 40 34  ALKPHOS 44 35*  BILITOT 1.9* 1.1   Lipids No results for input(s): "CHOL", "TRIG", "HDL", "LABVLDL", "LDLCALC", "CHOLHDL" in the last 168 hours.  Hematology Recent Labs  Lab  11/27/23 1233 11/28/23 0540  WBC 5.0 4.7  RBC 5.97* 4.95  HGB 19.0* 16.2  HCT 54.8* 46.2  MCV 91.8 93.3  MCH 31.8 32.7  MCHC 34.7 35.1  RDW 13.9 14.1  PLT 146* 130*   Thyroid No results for input(s): "TSH", "FREET4" in the last 168 hours.  BNPNo results for input(s): "BNP", "PROBNP" in the last 168 hours.  DDimer No results for input(s): "DDIMER" in the last 168 hours.   Radiology/Studies:  DG Chest Portable 1 View Result Date: 11/27/2023 CLINICAL DATA:  Tachycardia and headache.  SIRS. EXAM: PORTABLE CHEST 1 VIEW COMPARISON:  Chest radiograph dated 10/02/2016. FINDINGS: Minimal left lung base atelectasis. No focal consolidation, pleural effusion, or pneumothorax. Stable cardiac silhouette. No acute osseous pathology. IMPRESSION: No active disease. Electronically Signed   By: Elgie Collard M.D.   On: 11/27/2023 16:55   CT ABDOMEN PELVIS W CONTRAST Result Date: 11/27/2023 CLINICAL DATA:  Bowel obstruction suspected. Diarrhea for 3 days. Tachycardia with headache and unsteady gait. EXAM: CT ABDOMEN AND PELVIS WITH CONTRAST TECHNIQUE: Multidetector CT imaging of the abdomen and pelvis was performed using the standard protocol following bolus administration of intravenous contrast. RADIATION DOSE REDUCTION: This exam was performed according to the departmental dose-optimization program which includes automated exposure control, adjustment of the mA and/or kV according to patient size and/or use of iterative reconstruction technique. CONTRAST:  OMNIPAQUE IOHEXOL 300 MG/ML  SOLN COMPARISON:  Abdominopelvic CT 03/18/2013. FINDINGS: Lower chest: Mild atelectasis or scarring at both lung bases, similar to previous CT. No significant pleural or pericardial effusion. Extensive atherosclerosis of the aorta and coronary arteries with calcifications of the aortic valve. Hepatobiliary: The liver is normal in density without suspicious focal abnormality. No evidence of gallstones, gallbladder wall  thickening or biliary dilatation. Pancreas: Diffuse fatty replacement. No focal abnormality, ductal dilatation or surrounding inflammation. Spleen: Scattered calcified granulomas. No splenomegaly or other focal abnormality. Adrenals/Urinary Tract: Both adrenal glands appear normal. No evidence of urinary tract calculus, suspicious renal lesion or hydronephrosis. Small right renal cysts, similar to previous study. No specific follow-up imaging recommended. The bladder appears unremarkable for its degree of distention. Stomach/Bowel: No enteric contrast administered. The stomach appears unremarkable for its degree of distention. There is no small bowel distension, wall thickening or surrounding inflammation. Postsurgical changes consistent with previous right hemicolectomy and ileocolonic anastomosis. There are moderate diverticular changes throughout the colon. There is new diffuse colonic wall thickening, especially in the descending and sigmoid colon. No focal pericolonic inflammation or extraluminal gas identified. No evidence of bowel obstruction or perforation. Vascular/Lymphatic: There are no enlarged abdominal or pelvic lymph nodes. Aortic and branch vessel atherosclerosis. No evidence of aneurysm or large vessel occlusion. The portal, superior mesenteric and splenic veins are patent. Reproductive: Mild enlargement of the prostate gland, including the median lobe which protrudes into the bladder base. Other: No evidence of abdominal wall mass or hernia. No ascites or pneumoperitoneum. Musculoskeletal: No acute or significant osseous findings. Mild multilevel spondylosis stable asymmetric spurring of the right femoral lesser trochanter which may be posttraumatic. IMPRESSION: 1. New diffuse colonic wall thickening, especially in  the descending and sigmoid colon, consistent with colitis. This could be infectious or inflammatory. Correlate clinically. 2. Diffuse colonic diverticulosis without evidence of focal  inflammation to suggest diverticulitis. No evidence of bowel obstruction or perforation. 3. No other acute findings. 4.  Aortic Atherosclerosis (ICD10-I70.0). Electronically Signed   By: Carey Bullocks M.D.   On: 11/27/2023 16:12     Assessment and Plan:   Non-Sustained VT Patient was admitted for Salmonella gastroenteritis after presenting with diarrhea for the past several days. While waiting in the ED, he had a 32 beat run of NSVT. - Asymptomatic with this.  - Telemetry shows no recurrent VT. Rates in the 100s to 110s. - Will check Echo. - Potassium 3.1 and Magnesium 1.8. Both have been repleted. - On Toprol-XL 50mg  in the AM and 100mg  in the PM at home. However, he has not been taking these consistently due to profuse diarrhea. Will restart.  - Suspect ectopy is all due to dehydration from gastroenteritis. Continue to monitor on telemetry and electrolytes.  CAD History of inferior STEMI in 08/2022 s/p DES to RCA. EKG shows no acute ischemic changes. High-sensitivity troponin 18 >> 6. - No chest pain.  - Continue DAPT with Aspirin and Plavix. Per Dr. Alford Highland last note, plan is for long-term DAPT. - Continue beta-blocker and statin.  Hypertension BP soft but stable. - Continue to hold home Losartan and HCTZ. - Restart home Toprol-XL as above.  Hyperlipidemia LDL 32 in 07/2023. - Continue Lipitor 80mg  daily.   Otherwise, per primary team: - Salmonella gastroenteritis/ colitis - Psoriasis - History of colon cancer - History of TIA  Risk Assessment/Risk Scores:    For questions or updates, please contact [facility] Please consult [email or web address] for contact info under    Signed, Corrin Parker, PA-C  11/28/2023 2:03 PM  I have personally seen and examined this patient. I agree with the assessment and plan as outlined above.  78 yo male with history of CAD, HTN, HLD, CVA and colon cancer admitted with diarrhea and found to have Salmonella infection. 32 beat  run of VT in the ED.  He is being treated with IV fluids and antibiotics with ongoing diarrhea.  He has no other complaints.  Labs reviewed by me EKG reviewed by me and shows sinus tachycardia with non-specific T wave flattening Exam: NAD  CV:RRR no murmurs Lungs: clear bilaterally Ext: no LE edema Plan: NSVT: In setting of dehydration with diarrhea, hypokalemia. Known CAD. Will replace electrolytes, monitor on telemetry and arrange echo tomorrow to assess LV systolic function.   Verne Carrow, MD, [facility] 11/28/2023 4:17 PM

## 2023-11-28 NOTE — ED Notes (Signed)
Report called over to [facility] ED and given to [facility]

## 2023-11-28 NOTE — H&P (Signed)
History and Physical    Patient: Jose Meyer ZOX:[id number] DOB: 09/06/1946 DOA: 11/27/2023 DOS: the patient was seen and examined on 11/28/2023 PCP: Farris Has, MD  Patient coming from:  [facility]  - lives with his wife. Ambulates independently    Chief Complaint: nausea/diarrhea   HPI: Jose Meyer is a 78 y.o. male with medical history significant of CAD, hx of CVA, HTN, HLD, hx of colon cancer s/p right hemicolectomy in 2009 who presented to ED for 4 day history of persistent nausea and watery diarrhea.  He states on Monday night he started to have severe diarrhea and did not get any better. He was having a loose stool every 20 minutes. He tried imodium with no relief. Wednesday he called his PCP and had a virtual visit. Recommended more imodium and this did not help. He was seen in office yesterday and his heart rate was elevated so they sent him to ED. He denies any blood in his stool, but states it is green in color. No vomiting, just nausea. He has some some soreness in his abdomen in his RLQ, but mild rated as a 1/10. No radiation, just occasional sharp pain.   He also started wegovy about 4 weeks ago   He did eat at a restaurant on Sunday and had a sub sandwich with beef, lettuce and green peppers. He has eaten at this place numerous times.   Denies any fever/chills, vision changes, +headaches, chest pain or palpitations, shortness of breath or cough, or leg swelling.   He does not smoke or drink alcohol.   ER Course:  vitals: afebrile, bp: 125/73, HR: 140, RR: 20, oxygen: 96%RA Pertinent labs: hgb: 19>16.2, potassium: 3.1, lactic acid: 2.1>1.7, troponin 18>6 CXR: no active disease CT abdomen/pelvis: New diffuse colonic wall thickening, especially in the descending and sigmoid colon, consistent with colitis. This could be infectious or inflammatory. Correlate clinically. 2. Diffuse colonic diverticulosis without evidence of focal inflammation to suggest diverticulitis. No  evidence of bowel obstruction or perforation. 3. No other acute findings. 4.  Aortic Atherosclerosis  In ED: given 2.5L IVF bolus, potassium, zofran, flagyl and rocephin. Had some VT runs. Cardiology called and recommended electrolyte replacement and check troponin. [facility] asked to admit    Review of Systems: As mentioned in the history of present illness. All other systems reviewed and are negative. Past Medical History:  Diagnosis Date   Colon cancer [facility])    s/p R hemicolectomy 06/2008   Coronary artery disease    Non-Obstructive;  LHC 2000 no sig dz; ETT-Cardiolite 2007 with fixed Inf defect;  LHC 4/07:  EF 60%, CFX 30-40%, mRCA 30-40% (prob false + CLite)   CVA (cerebral infarction)    cerebellar CVA 2000: doc by MRI; Poss TIA 7/10 with vertigo; MRI 7/10 => only small vessel disease; Carotids 9/10: no sig disease;  TEE 9/10: normal LV and RV size and systolic fxn, no PFO, no sig valvular dz, at least Gr III plaque in arch and desc thoracic aorta (no ulceration or mobility)   Diverticulosis    H/O echocardiogram    Echo 9/13: EF 55-60%, mod LVH, grade 1 diast dysfn, mild to mod LAE   Hyperlipidemia    Hypertension    Psoriasis    Past Surgical History:  Procedure Laterality Date   KNEE SURGERY Left 07/2014   Social History:  reports that he quit smoking about 50 years ago. His smoking use included cigarettes. He has never used smokeless tobacco. He reports  current alcohol use. He reports that he does not use drugs.  No Known Allergies  Family History  Problem Relation Age of Onset   Heart disease Father    Heart attack Father     Prior to Admission medications   Medication Sig Start Date End Date Taking? Authorizing Provider  aspirin 81 MG chewable tablet Chew 81 mg by mouth daily.   Yes [provider]  atorvastatin (LIPITOR) 80 MG tablet Take 1 tablet (80 mg total) by mouth daily. 11/17/23  Yes Laurey Morale, MD  betamethasone dipropionate 0.05 % lotion Apply 0.05  application topically as needed. For the scalp after shower 09/21/14  Yes [provider]  clopidogrel (PLAVIX) 75 MG tablet Take 1 tablet (75 mg total) by mouth daily. 11/04/23  Yes Laurey Morale, MD  diclofenac Sodium (VOLTAREN) 1 % GEL 1 application   Yes [provider]  ELIDEL 1 % cream Apply 1 application topically as needed. For skin 09/20/14  Yes [provider]  fluticasone (FLONASE) 50 MCG/ACT nasal spray Place 1 spray into both nostrils as needed for allergies or rhinitis.   Yes [provider]  furosemide (LASIX) 20 MG tablet Take 1 tablet (20 mg total) by mouth daily as needed. 04/22/23  Yes Bensimhon, Bevelyn Buckles, MD  hydrochlorothiazide (HYDRODIURIL) 25 MG tablet Take 1 tablet (25 mg total) by mouth daily. 1 tablet in the morning 11/04/23  Yes Laurey Morale, MD  loperamide (IMODIUM A-D) 2 MG tablet Take 2 mg by mouth 4 (four) times daily as needed for diarrhea or loose stools.   Yes [provider]  losartan (COZAAR) 25 MG tablet Take 1 tablet (25 mg total) by mouth daily. 11/17/23  Yes Laurey Morale, MD  methocarbamol (ROBAXIN) 500 MG tablet TK 1 T PO Q 6 H PRF BACK SPASMS 06/22/19  Yes [provider]  metoprolol succinate (TOPROL-XL) 100 MG 24 hr tablet Take 0.5 tablets (50 mg total) by mouth every morning AND 1 tablet (100 mg total) every evening. Take with or immediately following a meal.. 11/04/23  Yes Laurey Morale, MD  Semaglutide-Weight Management (WEGOVY) 0.25 MG/0.5ML SOAJ Inject 0.25 mg into the skin once a week. FOR 4 WEEKS 11/03/23  Yes Laurey Morale, MD  SYSTANE ULTRA 0.4-0.3 % SOLN  07/26/19  Yes [provider]  Semaglutide-Weight Management (WEGOVY) 0.5 MG/0.5ML SOAJ Inject 0.5 mg into the skin once a week. FOR 4 WEEKS WHEN YOU FINISH THE 0.25 MG 11/03/23   Laurey Morale, MD  Semaglutide-Weight Management [facility]) 1 MG/0.5ML SOAJ Inject 1 mg into the skin once a week. WHEN YOU FINISH THE 0.5 MG 11/03/23    Laurey Morale, MD    Physical Exam: Vitals:   11/28/23 0500 11/28/23 0530 11/28/23 0600 11/28/23 0915  BP: 112/72 98/68 119/79 131/76  Pulse: (!) 113 (!) 111 (!) 114 (!) 105  Resp: 16 20 (!) 24 19  Temp:    97.8 F (36.6 C)  TempSrc:    Oral  SpO2: 91% 91% 92% 100%  Weight:       General:  Appears calm and comfortable and is in NAD Eyes:  PERRL, EOMI, normal lids, iris ENT:  grossly normal hearing, lips & tongue, mmm; appropriate dentition Neck:  no LAD, masses or thyromegaly; no carotid bruits Cardiovascular:  RRR, no m/r/g. No LE edema.  Respiratory:   CTA bilaterally with no wheezes/rales/rhonchi.  Normal respiratory effort. Abdomen:  soft, NT, ND, NABS. No rebound  or guarding  Back:   normal alignment, no CVAT Skin:  no rash or induration seen on limited exam. + skin tenting. Capillary refill less than 2 seconds  Musculoskeletal:  grossly normal tone BUE/BLE, good ROM, no bony abnormality Lower extremity:  No LE edema.  Limited foot exam with no ulcerations.  2+ distal pulses. Psychiatric:  grossly normal mood and affect, speech fluent and appropriate, AOx3 Neurologic:  CN 2-12 grossly intact, moves all extremities in coordinated fashion, sensation intact   Radiological Exams on Admission: Independently reviewed - see discussion in A/P where applicable  DG Chest Portable 1 View Result Date: 11/27/2023 CLINICAL DATA:  Tachycardia and headache.  SIRS. EXAM: PORTABLE CHEST 1 VIEW COMPARISON:  Chest radiograph dated 10/02/2016. FINDINGS: Minimal left lung base atelectasis. No focal consolidation, pleural effusion, or pneumothorax. Stable cardiac silhouette. No acute osseous pathology. IMPRESSION: No active disease. Electronically Signed   By: Elgie Collard M.D.   On: 11/27/2023 16:55   CT ABDOMEN PELVIS W CONTRAST Result Date: 11/27/2023 CLINICAL DATA:  Bowel obstruction suspected. Diarrhea for 3 days. Tachycardia with headache and unsteady gait. EXAM: CT ABDOMEN AND PELVIS  WITH CONTRAST TECHNIQUE: Multidetector CT imaging of the abdomen and pelvis was performed using the standard protocol following bolus administration of intravenous contrast. RADIATION DOSE REDUCTION: This exam was performed according to the departmental dose-optimization program which includes automated exposure control, adjustment of the mA and/or kV according to patient size and/or use of iterative reconstruction technique. CONTRAST:  OMNIPAQUE IOHEXOL 300 MG/ML  SOLN COMPARISON:  Abdominopelvic CT 03/18/2013. FINDINGS: Lower chest: Mild atelectasis or scarring at both lung bases, similar to previous CT. No significant pleural or pericardial effusion. Extensive atherosclerosis of the aorta and coronary arteries with calcifications of the aortic valve. Hepatobiliary: The liver is normal in density without suspicious focal abnormality. No evidence of gallstones, gallbladder wall thickening or biliary dilatation. Pancreas: Diffuse fatty replacement. No focal abnormality, ductal dilatation or surrounding inflammation. Spleen: Scattered calcified granulomas. No splenomegaly or other focal abnormality. Adrenals/Urinary Tract: Both adrenal glands appear normal. No evidence of urinary tract calculus, suspicious renal lesion or hydronephrosis. Small right renal cysts, similar to previous study. No specific follow-up imaging recommended. The bladder appears unremarkable for its degree of distention. Stomach/Bowel: No enteric contrast administered. The stomach appears unremarkable for its degree of distention. There is no small bowel distension, wall thickening or surrounding inflammation. Postsurgical changes consistent with previous right hemicolectomy and ileocolonic anastomosis. There are moderate diverticular changes throughout the colon. There is new diffuse colonic wall thickening, especially in the descending and sigmoid colon. No focal pericolonic inflammation or extraluminal gas identified. No evidence of  bowel obstruction or perforation. Vascular/Lymphatic: There are no enlarged abdominal or pelvic lymph nodes. Aortic and branch vessel atherosclerosis. No evidence of aneurysm or large vessel occlusion. The portal, superior mesenteric and splenic veins are patent. Reproductive: Mild enlargement of the prostate gland, including the median lobe which protrudes into the bladder base. Other: No evidence of abdominal wall mass or hernia. No ascites or pneumoperitoneum. Musculoskeletal: No acute or significant osseous findings. Mild multilevel spondylosis stable asymmetric spurring of the right femoral lesser trochanter which may be posttraumatic. IMPRESSION: 1. New diffuse colonic wall thickening, especially in the descending and sigmoid colon, consistent with colitis. This could be infectious or inflammatory. Correlate clinically. 2. Diffuse colonic diverticulosis without evidence of focal inflammation to suggest diverticulitis. No evidence of bowel obstruction or perforation. 3. No other acute findings. 4.  Aortic Atherosclerosis (ICD10-I70.0). Electronically Signed  By: Carey Bullocks M.D.   On: 11/27/2023 16:12    EKG: Independently reviewed.  Sinus tachycardia with rate 1070; nonspecific ST changes with no evidence of acute ischemia Prolonged QT   Labs on Admission: I have personally reviewed the available labs and imaging studies at the time of the admission.  Pertinent labs:   hgb: 19>16.2,  potassium: 3.1,  lactic acid: 2.1>1.7,  troponin 18>6  Assessment and Plan: Principal Problem:   Salmonella gastroenteritis Active Problems:   Ventricular tachycardia, nonsustained (HCC)   Prolonged QT interval   Hypokalemia   Essential hypertension   Personal history of transient ischemic attack (TIA), and cerebral infarction without residual deficits    Assessment and Plan: * Salmonella gastroenteritis 78 year old presenting with 4 day history of intractable, watery diarrhea, nausea and mild  abdominal pain found to have salmonella gastroenteritis with sepsis criteria  -obs to tele -bolused 2.5L in ED, continue IVF  -lactic acidosis resolved  -CT scan with diffuse colonic wall thickening, especially in descending and sigmoid colon, consistent with colitis. -GI PCR stool: +salmonella -C.diff pending  -enteric precautions -continue with rocephin  -continue with IVF   Ventricular tachycardia, nonsustained (HCC) Likely secondary to electrolyte derangement/qt prolongation. Asymptomatic  Replete electrolytes, continue monitoring No more episodes reported since last night with 10 second run Troponin 18>6 Repeat EKG pending   Prolonged QT interval Optimize electrolytes Keep on telemetry Avoid qt prolonging drugs  Repeat ekg in AM    Hypokalemia Secondary to exogenous losses in setting of salmonella gastroenteritis Repleted in ED Repeat potassium level today Trend  Magnesium wnl   Essential hypertension Blood pressure have been soft to normotensive  Hold his hydrochlorothiazide and cozaar Will start back his toprol tonight if bp remain stable   Personal history of transient ischemic attack (TIA), and cerebral infarction without residual deficits Continue ASA/plavix  Continue lipitor     Advance Care Planning:   Code Status: Not on file   Consults: none   DVT Prophylaxis: lovenox   Family Communication: wife at bedside   Severity of Illness: The appropriate patient status for this patient is OBSERVATION. Observation status is judged to be reasonable and necessary in order to provide the required intensity of service to ensure the patient's safety. The patient's presenting symptoms, physical exam findings, and initial radiographic and laboratory data in the context of their medical condition is felt to place them at decreased risk for further clinical deterioration. Furthermore, it is anticipated that the patient will be medically stable for discharge from the  hospital within 2 midnights of admission.   Author: Orland Mustard, MD 11/28/2023 11:24 AM  For on call review [email or web address].

## 2023-11-28 NOTE — ED Provider Notes (Signed)
Patient holding for admission +18 hours.  He is being admitted with colitis and diarrhea.  Approximately 5:30 AM patient was noted to have a run of ventricular tachycardia of 32 beats that spontaneously resolved.  He was asymptomatic.  Denies chest pain or shortness of breath.  Feels back to baseline now. Repeat EKG unchanged. Borderline prolonged QT.  Labs to be repeated. Potassium was 3.1 yesterday afternoon. will give empiric potassium and magnesium.  Per cardiology, Dr. Shirlee Latch  " Inferior STEMI in 11/23 treated with DES to 99% stenosis proximal RCA, nonobstructive disease in LAD and LCx.  Based on history, suspect he may have had some RV involvement (hypotension initially requiring IV fluid then hypoxemic and treated with Lasix). Echo at [facility] in 11/23 showed EF > 70% with no WMAs, but RV not well-visualized.  Echo here in 12/23 showed EF 65-70%, normal RV.  No exertional symptoms. "    Patient remains asymptomatic.  Heart rate has normalized to the 110s narrow complex. Updated Dr. Toniann Fail.  Will change his bed to [facility] from [place] long.  Will replace potassium and magnesium as needed.  Will give additional fluids he does appear to be dehydrated.  Ejection fraction is normal.  Discussed with cardiology Dr. Geraldo Pitter.  He agrees with volume resuscitation and electrolyte replacement at this point.  Would not initiate amiodarone unless he has recurrent episodes of VT.  Would not beta-blocker at this time.  Will cancel his home metoprolol.  Patient appears dry and needs additional volume.  Repeat labs still with persistent hypokalemia 3.1.  Additional 40 mill equivalents p.o. and 40 mEq IV ordered.  2 g of mag ordered empirically.  Blood pressure mental status remained stable.  Will initiate ED to ED transfer to expedite cardiology and hospitalist evaluation.  Discussed with Dr. Pilar Plate who accepts to [facility].  .Critical Care  Performed by: Glynn Octave, MD Authorized by: Glynn Octave, MD   Critical care provider statement:    Critical care time (minutes):  45   Critical care time was exclusive of:  Separately billable procedures and treating other patients   Critical care was necessary to treat or prevent imminent or life-threatening deterioration of the following conditions: Ventricular tachycardia.   Critical care was time spent personally by me on the following activities:  Development of treatment plan with patient or surrogate, discussions with consultants, evaluation of patient's response to treatment, examination of patient, ordering and review of laboratory studies, ordering and review of radiographic studies, ordering and performing treatments and interventions, pulse oximetry, re-evaluation of patient's condition, review of old charts, blood draw for specimens and obtaining history from patient or surrogate   I assumed direction of critical care for this patient from another provider in my specialty: no     Care discussed with: admitting provider       Glynn Octave, MD 11/28/23 [phone]

## 2023-11-28 NOTE — Assessment & Plan Note (Signed)
Continue ASA/plavix  Continue lipitor

## 2023-11-28 NOTE — Assessment & Plan Note (Signed)
Secondary to exogenous losses in setting of salmonella gastroenteritis Repleted in ED Repeat potassium level today Trend  Magnesium wnl

## 2023-11-28 NOTE — ED Notes (Signed)
Pt's wife Corrie Dandy) home phone # [phone], would like to be called with any updates.

## 2023-11-28 NOTE — Assessment & Plan Note (Signed)
Blood pressure have been soft to normotensive  Hold his hydrochlorothiazide and cozaar Will start back his toprol tonight if bp remain stable

## 2023-11-28 NOTE — Assessment & Plan Note (Addendum)
Likely secondary to electrolyte derangement/qt prolongation. Asymptomatic  Replete electrolytes, continue monitoring No more episodes reported since last night with 10 second run Troponin 18>6 Repeat EKG pending

## 2023-11-28 NOTE — ED Notes (Signed)
Pt given recliner to sit in as he was very uncomfortable on stretcher

## 2023-11-28 NOTE — Assessment & Plan Note (Signed)
Optimize electrolytes Keep on telemetry Avoid qt prolonging drugs  Repeat ekg in AM

## 2023-11-28 NOTE — ED Notes (Signed)
-  Called [facility] at 627am for transportation to [facility] ED - Dr. Pilar Plate accepting. Transport will not be until after 7am.

## 2023-11-28 NOTE — Progress Notes (Signed)
PHARMACY - PHYSICIAN COMMUNICATION CRITICAL VALUE ALERT - BLOOD CULTURE IDENTIFICATION (BCID)  Jose Meyer is an 78 y.o. male who presented to [facility] on 11/27/2023 with a chief complaint of N/V/D  Assessment:  BCID received for GPC in clusters identified as Staph spp. in 1 of 4 anaerobic bottles. Patient presenting for salmonella gastroenteritis, suspect likely contaminant.  Name of physician (or Provider) Contacted: Dr Artis Flock  Current antibiotics: ceftriaxone, metronidazole  Changes to prescribed antibiotics recommended:  No changes based on results of this BCID  Results for orders placed or performed during the hospital encounter of 11/27/23  Blood Culture ID Panel (Reflexed) (Collected: 11/27/2023 12:56 PM)  Result Value Ref Range   Enterococcus faecalis NOT DETECTED NOT DETECTED   Enterococcus Faecium NOT DETECTED NOT DETECTED   Listeria monocytogenes NOT DETECTED NOT DETECTED   Staphylococcus species DETECTED (A) NOT DETECTED   Staphylococcus aureus (BCID) NOT DETECTED NOT DETECTED   Staphylococcus epidermidis NOT DETECTED NOT DETECTED   Staphylococcus lugdunensis NOT DETECTED NOT DETECTED   Streptococcus species NOT DETECTED NOT DETECTED   Streptococcus agalactiae NOT DETECTED NOT DETECTED   Streptococcus pneumoniae NOT DETECTED NOT DETECTED   Streptococcus pyogenes NOT DETECTED NOT DETECTED   A.calcoaceticus-baumannii NOT DETECTED NOT DETECTED   Bacteroides fragilis NOT DETECTED NOT DETECTED   Enterobacterales NOT DETECTED NOT DETECTED   Enterobacter cloacae complex NOT DETECTED NOT DETECTED   Escherichia coli NOT DETECTED NOT DETECTED   Klebsiella aerogenes NOT DETECTED NOT DETECTED   Klebsiella oxytoca NOT DETECTED NOT DETECTED   Klebsiella pneumoniae NOT DETECTED NOT DETECTED   Proteus species NOT DETECTED NOT DETECTED   Salmonella species NOT DETECTED NOT DETECTED   Serratia marcescens NOT DETECTED NOT DETECTED   Haemophilus influenzae NOT DETECTED NOT  DETECTED   Neisseria meningitidis NOT DETECTED NOT DETECTED   Pseudomonas aeruginosa NOT DETECTED NOT DETECTED   Stenotrophomonas maltophilia NOT DETECTED NOT DETECTED   Candida albicans NOT DETECTED NOT DETECTED   Candida auris NOT DETECTED NOT DETECTED   Candida glabrata NOT DETECTED NOT DETECTED   Candida krusei NOT DETECTED NOT DETECTED   Candida parapsilosis NOT DETECTED NOT DETECTED   Candida tropicalis NOT DETECTED NOT DETECTED   Cryptococcus neoformans/gattii NOT DETECTED NOT DETECTED    Knute Neu 11/28/2023  12:16 PM

## 2023-11-28 NOTE — ED Notes (Signed)
Called [facility] to confirm that transport planned-in route 07:38

## 2023-11-28 NOTE — ED Notes (Signed)
Cardiology at bedside.

## 2023-11-28 NOTE — ED Triage Notes (Signed)
Pt transfer [facility], came in for diarrhea and had a run of Vtach, ,low potassium. Axox4.

## 2023-11-28 NOTE — Assessment & Plan Note (Signed)
78 year old presenting with 4 day history of intractable, watery diarrhea, nausea and mild abdominal pain found to have salmonella gastroenteritis with sepsis criteria  -obs to tele -bolused 2.5L in ED, continue IVF  -lactic acidosis resolved  -CT scan with diffuse colonic wall thickening, especially in descending and sigmoid colon, consistent with colitis. -GI PCR stool: +salmonella -C.diff pending  -enteric precautions -continue with rocephin  -continue with IVF

## 2023-11-29 ENCOUNTER — Inpatient Hospital Stay (HOSPITAL_COMMUNITY): Payer: Medicare PPO

## 2023-11-29 DIAGNOSIS — E876 Hypokalemia: Secondary | ICD-10-CM

## 2023-11-29 DIAGNOSIS — I4729 Other ventricular tachycardia: Secondary | ICD-10-CM | POA: Diagnosis not present

## 2023-11-29 DIAGNOSIS — A020 Salmonella enteritis: Secondary | ICD-10-CM | POA: Diagnosis not present

## 2023-11-29 DIAGNOSIS — I10 Essential (primary) hypertension: Secondary | ICD-10-CM

## 2023-11-29 DIAGNOSIS — I2510 Atherosclerotic heart disease of native coronary artery without angina pectoris: Secondary | ICD-10-CM

## 2023-11-29 DIAGNOSIS — Z8673 Personal history of transient ischemic attack (TIA), and cerebral infarction without residual deficits: Secondary | ICD-10-CM

## 2023-11-29 DIAGNOSIS — R9431 Abnormal electrocardiogram [ECG] [EKG]: Secondary | ICD-10-CM

## 2023-11-29 LAB — ECHOCARDIOGRAM COMPLETE
AR max vel: 2.41 cm2
AV Area VTI: 2.35 cm2
AV Area mean vel: 2.28 cm2
AV Mean grad: 8 mm[Hg]
AV Peak grad: 14.9 mm[Hg]
Ao pk vel: 1.93 m/s
Area-P 1/2: 3.03 cm2
S' Lateral: 2.8 cm
Weight: 3378.51 [oz_av]

## 2023-11-29 LAB — BASIC METABOLIC PANEL
Anion gap: 7 (ref 5–15)
BUN: 13 mg/dL (ref 8–23)
CO2: 22 mmol/L (ref 22–32)
Calcium: 7.8 mg/dL — ABNORMAL LOW (ref 8.9–10.3)
Chloride: 107 mmol/L (ref 98–111)
Creatinine, Ser: 0.79 mg/dL (ref 0.61–1.24)
GFR, Estimated: >60 mL/min (ref >60)
Glucose, Bld: 102 mg/dL — ABNORMAL HIGH (ref 70–99)
Potassium: 3.7 mmol/L (ref 3.5–5.1)
Sodium: 136 mmol/L (ref 135–145)

## 2023-11-29 LAB — CBC
HCT: 40.5 % (ref 39.0–52.0)
Hemoglobin: 13.7 g/dL (ref 13.0–17.0)
MCH: 31.9 pg (ref 26.0–34.0)
MCHC: 33.8 g/dL (ref 30.0–36.0)
MCV: 94.4 fL (ref 80.0–100.0)
Platelets: 137 10*3/uL — ABNORMAL LOW (ref 150–400)
RBC: 4.29 MIL/uL (ref 4.22–5.81)
RDW: 14.3 % (ref 11.5–15.5)
WBC: 5.4 10*3/uL (ref 4.0–10.5)
nRBC: 0 % (ref 0.0–0.2)

## 2023-11-29 LAB — MAGNESIUM: Magnesium: 2 mg/dL (ref 1.7–2.4)

## 2023-11-29 NOTE — Plan of Care (Signed)
  Problem: Education: Goal: Knowledge of General Education information will improve Description: Including pain rating scale, medication(s)/side effects and non-pharmacologic comfort measures Outcome: Progressing   Problem: Health Behavior/Discharge Planning: Goal: Ability to manage health-related needs will improve Outcome: Progressing   Problem: Clinical Measurements: Goal: Ability to maintain clinical measurements within normal limits will improve Outcome: Progressing Goal: Will remain free from infection Outcome: Progressing Goal: Diagnostic test results will improve Outcome: Progressing Goal: Respiratory complications will improve Outcome: Progressing Goal: Cardiovascular complication will be avoided Outcome: Progressing   Problem: Activity: Goal: Risk for activity intolerance will decrease Outcome: Progressing   Problem: Nutrition: Goal: Adequate nutrition will be maintained Outcome: Progressing   Problem: Coping: Goal: Level of anxiety will decrease Outcome: Progressing   Problem: Elimination: Goal: Will not experience complications related to urinary retention Outcome: Progressing   Problem: Pain Managment: Goal: General experience of comfort will improve and/or be controlled Outcome: Progressing   Problem: Safety: Goal: Ability to remain free from injury will improve Outcome: Progressing   Problem: Skin Integrity: Goal: Risk for impaired skin integrity will decrease Outcome: Progressing

## 2023-11-29 NOTE — Plan of Care (Signed)
  Problem: Clinical Measurements: Goal: Ability to maintain clinical measurements within normal limits will improve Outcome: Progressing   Problem: Pain Managment: Goal: General experience of comfort will improve and/or be controlled Outcome: Progressing   Problem: Safety: Goal: Ability to remain free from injury will improve Outcome: Progressing

## 2023-11-29 NOTE — Progress Notes (Signed)
 Chart reviewed. Awaiting echocardiogram. Will evaluate patient once echocardiogram results if he remains stable. Parke Poisson, MD

## 2023-11-29 NOTE — Progress Notes (Signed)
 Echocardiogram 2D Echocardiogram has been performed.  Jose Meyer 11/29/2023, 4:48 PM

## 2023-11-29 NOTE — Progress Notes (Signed)
 Progress Note   Patient: Jose Meyer FIE:[id number] DOB: May 28, 1946 DOA: 11/27/2023     1 DOS: the patient was seen and examined on 11/29/2023   Brief hospital course: Jose Meyer is a 78 y.o. male with medical history significant of CAD, hx of CVA, HTN, HLD, hx of colon cancer s/p right hemicolectomy in 2009 who presented to ED for 4 day history of persistent nausea and watery diarrhea.  He states on Monday night he started to have severe diarrhea. Patient tested positive for Salmonella  Assessment and Plan: * Salmonella gastroenteritis Presented with intractable, watery diarrhea, nausea and mild abdominal pain found to have salmonella gastroenteritis with sepsis criteria  CT scan with diffuse colonic wall thickening, especially in descending and sigmoid colon, consistent with colitis. GI PCR stool: +salmonella Continue enteric precautions. Continue gentle IVF  Lactic acidosis resolved  Continue with rocephin  Able to tolerate liquid diet.  Ventricular tachycardia, nonsustained (HCC) Secondary to electrolyte derangement/qt prolongation. Asymptomatic  Replete electrolytes, continue monitoring Continue telemetry. Cardiology evaluation appreciated. Echo shows EF 60%, grade 1 dd.  Prolonged QT interval Optimize electrolytes Keep on telemetry Avoid qt prolonging drugs  Repeat daily EKG.  Hypokalemia Secondary to exogenous losses in setting of salmonella gastroenteritis Repleted in ED Repeat potassium 3.7 Magnesium wnl   Essential hypertension Blood pressure lower side.  Hold hydrochlorothiazide and cozaar Resume Metoprolol if BP improves.  Personal history of transient ischemic attack (TIA), and cerebral infarction without residual deficits Continue ASA/plavix  Continue lipitor.    Out of bed to chair. Incentive spirometry. Nursing supportive care. Fall, aspiration precautions. DVT prophylaxis   Code Status: Full Code  Subjective: Patient is seen and  examined today morning. He is lying in bed. Abdomen tender. Able to tolerate liquids.  Physical Exam: Vitals:   11/28/23 2349 11/29/23 0111 11/29/23 0410 11/29/23 0810  BP: 113/72  110/65 (!) 86/49  Pulse: 90  88 79  Resp: 18  18 18   Temp: 98.8 F (37.1 C)  98.2 F (36.8 C) 97.9 F (36.6 C)  TempSrc: Oral  Oral   SpO2: 97%  96% 95%  Weight:  95.8 kg      General - Elderly Caucasian male, no acute distress HEENT - PERRLA, EOMI, atraumatic head, non tender sinuses. Lung - Clear, no rales, rhonchi, wheezes. Heart - S1, S2 heard, no murmurs, rubs, no pedal edema. Abdomen - Soft, epigastric tender, bowel sounds good Neuro - Alert, awake and oriented x 3, non focal exam. Skin - Warm and dry.  Data Reviewed:      Latest Ref Rng & Units 11/29/2023    6:55 AM 11/28/2023    5:40 AM 11/27/2023   12:33 PM  CBC  WBC 4.0 - 10.5 K/uL 5.4  4.7  5.0   Hemoglobin 13.0 - 17.0 g/dL 16.6  06.3  01.6   Hematocrit 39.0 - 52.0 % 40.5  46.2  54.8   Platelets 150 - 400 K/uL 137  130  146       Latest Ref Rng & Units 11/29/2023    6:55 AM 11/28/2023   12:40 PM 11/28/2023    5:40 AM  BMP  Glucose 70 - 99 mg/dL 010  95  932   BUN 8 - 23 mg/dL 13  15  20    Creatinine 0.61 - 1.24 mg/dL 3.55  7.32  2.02   Sodium 135 - 145 mmol/L 136  139  138   Potassium 3.5 - 5.1 mmol/L 3.7  4.0  3.1  Chloride 98 - 111 mmol/L 107  100  102   CO2 22 - 32 mmol/L 22  26  29    Calcium 8.9 - 10.3 mg/dL 7.8  8.6  8.0    ECHOCARDIOGRAM COMPLETE Result Date: 11/29/2023    ECHOCARDIOGRAM REPORT   Patient Name:   Jose Meyer Date of Exam: 11/29/2023 Medical Rec #:  [id number]         Height:       72.0 in Accession #:    [id number]        Weight:       211.2 lb Date of Birth:  04-Feb-1946         BSA:          2.180 m Patient Age:    77 years          BP:           110/65 mmHg Patient Gender: M                 HR:           80 bpm. Exam Location:  Inpatient Procedure: 2D Echo, Cardiac Doppler and Color Doppler (Both  Spectral and Color            Flow Doppler were utilized during procedure). Indications:    Ventricular Tachycardia I47.2  History:        Patient has prior history of Echocardiogram examinations, most                 recent 09/16/2022. Angina and CAD, Stroke,                 Arrythmias:Tachycardia; Risk Factors:Hypertension and                 Dyslipidemia.  Sonographer:    Lucendia Herrlich RCS Referring Phys: [id number] Lynda Rainwater A ACHARYA IMPRESSIONS  1. Left ventricular ejection fraction, by estimation, is 65 to 70%. The left ventricle has normal function. The left ventricle has no regional wall motion abnormalities. There is moderate left ventricular hypertrophy. Left ventricular diastolic parameters are consistent with Grade I diastolic dysfunction (impaired relaxation).  2. Right ventricular systolic function is normal. The right ventricular size is normal. Tricuspid regurgitation signal is inadequate for assessing PA pressure.  3. The mitral valve is normal in structure. No evidence of mitral valve regurgitation. No evidence of mitral stenosis.  4. The aortic valve is tricuspid. There is severe calcifcation of the aortic valve. There is severe thickening of the aortic valve. Aortic valve regurgitation is not visualized. No aortic stenosis is present. FINDINGS  Left Ventricle: Left ventricular ejection fraction, by estimation, is 65 to 70%. The left ventricle has normal function. The left ventricle has no regional wall motion abnormalities. Strain imaging was not performed. The left ventricular internal cavity  size was normal in size. There is moderate left ventricular hypertrophy. Left ventricular diastolic parameters are consistent with Grade I diastolic dysfunction (impaired relaxation). Normal left ventricular filling pressure. Right Ventricle: The right ventricular size is normal. Right vetricular wall thickness was not well visualized. Right ventricular systolic function is normal. Tricuspid regurgitation  signal is inadequate for assessing PA pressure. Left Atrium: Left atrial size was normal in size. Right Atrium: Right atrial size was normal in size. Pericardium: There is no evidence of pericardial effusion. Mitral Valve: The mitral valve is normal in structure. No evidence of mitral valve regurgitation. No evidence of mitral valve stenosis. Tricuspid Valve: The tricuspid valve is normal  in structure. Tricuspid valve regurgitation is not demonstrated. No evidence of tricuspid stenosis. Aortic Valve: The aortic valve is tricuspid. There is severe calcifcation of the aortic valve. There is severe thickening of the aortic valve. There is severe aortic valve annular calcification. Aortic valve regurgitation is not visualized. No aortic stenosis is present. Aortic valve mean gradient measures 8.0 mmHg. Aortic valve peak gradient measures 14.9 mmHg. Aortic valve area, by VTI measures 2.35 cm. Pulmonic Valve: The pulmonic valve was not well visualized. Pulmonic valve regurgitation is not visualized. No evidence of pulmonic stenosis. Aorta: The aortic root is normal in size and structure. IAS/Shunts: No atrial level shunt detected by color flow Doppler. Additional Comments: 3D imaging was not performed.  LEFT VENTRICLE PLAX 2D LVIDd:         4.30 cm   Diastology LVIDs:         2.80 cm   LV e' medial:    11.30 cm/s LV PW:         1.40 cm   LV E/e' medial:  6.4 LV IVS:        1.40 cm   LV e' lateral:   9.90 cm/s LVOT diam:     2.40 cm   LV E/e' lateral: 7.3 LV SV:         86 LV SV Index:   39 LVOT Area:     4.52 cm  RIGHT VENTRICLE RV S prime:     14.90 cm/s TAPSE (M-mode): 2.3 cm LEFT ATRIUM             Index        RIGHT ATRIUM           Index LA diam:        4.30 cm 1.97 cm/m   RA Area:     11.40 cm LA Vol (A2C):   56.3 ml 25.80 ml/m  RA Volume:   21.70 ml  9.95 ml/m LA Vol (A4C):   72.5 ml 33.25 ml/m LA Biplane Vol: 72.5 ml 33.25 ml/m  AORTIC VALVE AV Area (Vmax):    2.41 cm AV Area (Vmean):   2.28 cm AV Area  (VTI):     2.35 cm AV Vmax:           193.00 cm/s AV Vmean:          127.000 cm/s AV VTI:            0.365 m AV Peak Grad:      14.9 mmHg AV Mean Grad:      8.0 mmHg LVOT Vmax:         103.00 cm/s LVOT Vmean:        63.900 cm/s LVOT VTI:          0.190 m LVOT/AV VTI ratio: 0.52  AORTA Ao Root diam: 3.80 cm MITRAL VALVE MV Area (PHT): 3.03 cm    SHUNTS MV Decel Time: 250 msec    Systemic VTI:  0.19 m MV E velocity: 72.60 cm/s  Systemic Diam: 2.40 cm MV A velocity: 86.50 cm/s MV E/A ratio:  0.84 Dina Rich MD Electronically signed by Dina Rich MD Signature Date/Time: 11/29/2023/4:58:14 PM    Final    Family Communication: Discussed with patient, he understand and agree. All questions answereed.  Disposition: Status is: Inpatient Remains inpatient appropriate because: diarrhea, abdominal discomfort.  Planned Discharge Destination: Home     Time spent: 38 minutes  Author: Marcelino Duster, MD 11/29/2023 5:45 PM Secure chat 7am to  7pm For on call review [email or web address].

## 2023-11-30 DIAGNOSIS — E876 Hypokalemia: Secondary | ICD-10-CM | POA: Diagnosis not present

## 2023-11-30 DIAGNOSIS — A020 Salmonella enteritis: Secondary | ICD-10-CM | POA: Diagnosis not present

## 2023-11-30 DIAGNOSIS — I10 Essential (primary) hypertension: Secondary | ICD-10-CM | POA: Diagnosis not present

## 2023-11-30 DIAGNOSIS — I4729 Other ventricular tachycardia: Secondary | ICD-10-CM | POA: Diagnosis not present

## 2023-11-30 LAB — CULTURE, BLOOD (ROUTINE X 2): Special Requests: ADEQUATE

## 2023-11-30 MED ORDER — CLOPIDOGREL BISULFATE 75 MG PO TABS
75.0000 mg | ORAL_TABLET | Freq: Every day | ORAL | Status: DC
  Administered 2023-11-30 – 2023-12-02 (×3): 75 mg via ORAL
  Filled 2023-11-30 (×3): qty 1

## 2023-11-30 MED ORDER — ASPIRIN 81 MG PO CHEW
81.0000 mg | CHEWABLE_TABLET | Freq: Every day | ORAL | Status: DC
  Administered 2023-11-30 – 2023-12-02 (×3): 81 mg via ORAL
  Filled 2023-11-30 (×3): qty 1

## 2023-11-30 MED ORDER — ATORVASTATIN CALCIUM 80 MG PO TABS
80.0000 mg | ORAL_TABLET | Freq: Every day | ORAL | Status: DC
  Administered 2023-11-30 – 2023-12-02 (×3): 80 mg via ORAL
  Filled 2023-11-30 (×3): qty 1

## 2023-11-30 NOTE — Progress Notes (Signed)
 Tele reviewed, no recurrent NSVT. Echo grossly normal. Cardiology will sign off, call back with concerns. Will request cardiology follow up 4-6 weeks.  Parke Poisson, MD

## 2023-11-30 NOTE — Progress Notes (Signed)
 Progress Note   Patient: Jose Meyer [id number]:[id number] DOB: January 17, 1946 DOA: 11/27/2023     2 DOS: the patient was seen and examined on 11/30/2023   Brief hospital course: Jose Meyer is a 78 y.o. male with medical history significant of CAD, hx of CVA, HTN, HLD, hx of colon cancer s/p right hemicolectomy in 2009 who presented to ED for 4 day history of persistent nausea and watery diarrhea.  He states on Monday night he started to have severe diarrhea. Patient tested positive for Salmonella  Assessment and Plan: * Salmonella gastroenteritis Presented with intractable, watery diarrhea, nausea and mild abdominal pain found to have salmonella gastroenteritis with sepsis criteria  CT scan with diffuse colonic wall thickening, especially in descending and sigmoid colon, consistent with colitis. Continue enteric precautions. He is eating fair, stopped IVF  Continue with rocephin daily.  Able to tolerate liquid diet, advance to soft diet.  Ventricular tachycardia, nonsustained (HCC) Secondary to electrolyte derangement/ QT prolongation. Asymptomatic  Replete electrolytes, continue monitoring Continue telemetry. Cardiology follow up appreciated. Echo shows EF 60%, grade 1 dd. No further work up needed.  Prolonged QT interval Optimize electrolytes. Continue telemetry monitoring. Avoid qt prolonging drugs   Hypokalemia Secondary to GI losses in setting of severe diarrhea. Repeat potassium improved. Magnesium wnl   Essential hypertension Blood better today. Continue metoprolol. Hold hydrochlorothiazide and cozaar  Personal history of transient ischemic attack (TIA), and cerebral infarction without residual deficits Continue ASA/plavix  Continue lipitor.    Out of bed to chair. Incentive spirometry. Nursing supportive care. Fall, aspiration precautions. DVT prophylaxis   Code Status: Full Code  Subjective: Patient is seen and examined today morning. States diarrhea  better today still loose but yellowish brown. Able to tolerate liquids. Denies abdominal discomfort.  Physical Exam: Vitals:   11/29/23 2056 11/30/23 0029 11/30/23 0336 11/30/23 0719  BP: 106/68 104/65 101/60 120/72  Pulse: 97 93 89 98  Resp:  18 17 18   Temp: 98.1 F (36.7 C) 98 F (36.7 C) 98.9 F (37.2 C) 98.5 F (36.9 C)  TempSrc: Oral     SpO2: 96% 95% 93% 93%  Weight:        General - Elderly Caucasian male, no acute distress HEENT - PERRLA, EOMI, atraumatic head, non tender sinuses. Lung - Clear, no rales, rhonchi, wheezes. Heart - S1, S2 heard, no murmurs, rubs, no pedal edema. Abdomen - Soft, non tender, distended, bowel sounds good Neuro - Alert, awake and oriented x 3, non focal exam. Skin - Warm and dry.  Data Reviewed:      Latest Ref Rng & Units 11/29/2023    6:55 AM 11/28/2023    5:40 AM 11/27/2023   12:33 PM  CBC  WBC 4.0 - 10.5 K/uL 5.4  4.7  5.0   Hemoglobin 13.0 - 17.0 g/dL 57.8  46.9  62.9   Hematocrit 39.0 - 52.0 % 40.5  46.2  54.8   Platelets 150 - 400 K/uL 137  130  146       Latest Ref Rng & Units 11/29/2023    6:55 AM 11/28/2023   12:40 PM 11/28/2023    5:40 AM  BMP  Glucose 70 - 99 mg/dL 528  95  413   BUN 8 - 23 mg/dL 13  15  20    Creatinine 0.61 - 1.24 mg/dL 2.44  0.10  2.72   Sodium 135 - 145 mmol/L 136  139  138   Potassium 3.5 - 5.1 mmol/L 3.7  4.0  3.1   Chloride 98 - 111 mmol/L 107  100  102   CO2 22 - 32 mmol/L 22  26  29    Calcium 8.9 - 10.3 mg/dL 7.8  8.6  8.0    ECHOCARDIOGRAM COMPLETE Result Date: 11/29/2023    ECHOCARDIOGRAM REPORT   Patient Name:   Jose Meyer Date of Exam: 11/29/2023 Medical Rec #:  [id number]         Height:       72.0 in Accession #:    [id number]        Weight:       211.2 lb Date of Birth:  09/12/1946         BSA:          2.180 m Patient Age:    77 years          BP:           110/65 mmHg Patient Gender: M                 HR:           80 bpm. Exam Location:  Inpatient Procedure: 2D Echo, Cardiac  Doppler and Color Doppler (Both Spectral and Color            Flow Doppler were utilized during procedure). Indications:    Ventricular Tachycardia I47.2  History:        Patient has prior history of Echocardiogram examinations, most                 recent 09/16/2022. Angina and CAD, Stroke,                 Arrythmias:Tachycardia; Risk Factors:Hypertension and                 Dyslipidemia.  Sonographer:    Lucendia Herrlich RCS Referring Phys: [id number] Lynda Rainwater A ACHARYA IMPRESSIONS  1. Left ventricular ejection fraction, by estimation, is 65 to 70%. The left ventricle has normal function. The left ventricle has no regional wall motion abnormalities. There is moderate left ventricular hypertrophy. Left ventricular diastolic parameters are consistent with Grade I diastolic dysfunction (impaired relaxation).  2. Right ventricular systolic function is normal. The right ventricular size is normal. Tricuspid regurgitation signal is inadequate for assessing PA pressure.  3. The mitral valve is normal in structure. No evidence of mitral valve regurgitation. No evidence of mitral stenosis.  4. The aortic valve is tricuspid. There is severe calcifcation of the aortic valve. There is severe thickening of the aortic valve. Aortic valve regurgitation is not visualized. No aortic stenosis is present. FINDINGS  Left Ventricle: Left ventricular ejection fraction, by estimation, is 65 to 70%. The left ventricle has normal function. The left ventricle has no regional wall motion abnormalities. Strain imaging was not performed. The left ventricular internal cavity  size was normal in size. There is moderate left ventricular hypertrophy. Left ventricular diastolic parameters are consistent with Grade I diastolic dysfunction (impaired relaxation). Normal left ventricular filling pressure. Right Ventricle: The right ventricular size is normal. Right vetricular wall thickness was not well visualized. Right ventricular systolic function is  normal. Tricuspid regurgitation signal is inadequate for assessing PA pressure. Left Atrium: Left atrial size was normal in size. Right Atrium: Right atrial size was normal in size. Pericardium: There is no evidence of pericardial effusion. Mitral Valve: The mitral valve is normal in structure. No evidence of mitral valve regurgitation. No evidence of mitral valve stenosis. Tricuspid Valve:  The tricuspid valve is normal in structure. Tricuspid valve regurgitation is not demonstrated. No evidence of tricuspid stenosis. Aortic Valve: The aortic valve is tricuspid. There is severe calcifcation of the aortic valve. There is severe thickening of the aortic valve. There is severe aortic valve annular calcification. Aortic valve regurgitation is not visualized. No aortic stenosis is present. Aortic valve mean gradient measures 8.0 mmHg. Aortic valve peak gradient measures 14.9 mmHg. Aortic valve area, by VTI measures 2.35 cm. Pulmonic Valve: The pulmonic valve was not well visualized. Pulmonic valve regurgitation is not visualized. No evidence of pulmonic stenosis. Aorta: The aortic root is normal in size and structure. IAS/Shunts: No atrial level shunt detected by color flow Doppler. Additional Comments: 3D imaging was not performed.  LEFT VENTRICLE PLAX 2D LVIDd:         4.30 cm   Diastology LVIDs:         2.80 cm   LV e' medial:    11.30 cm/s LV PW:         1.40 cm   LV E/e' medial:  6.4 LV IVS:        1.40 cm   LV e' lateral:   9.90 cm/s LVOT diam:     2.40 cm   LV E/e' lateral: 7.3 LV SV:         86 LV SV Index:   39 LVOT Area:     4.52 cm  RIGHT VENTRICLE RV S prime:     14.90 cm/s TAPSE (M-mode): 2.3 cm LEFT ATRIUM             Index        RIGHT ATRIUM           Index LA diam:        4.30 cm 1.97 cm/m   RA Area:     11.40 cm LA Vol (A2C):   56.3 ml 25.80 ml/m  RA Volume:   21.70 ml  9.95 ml/m LA Vol (A4C):   72.5 ml 33.25 ml/m LA Biplane Vol: 72.5 ml 33.25 ml/m  AORTIC VALVE AV Area (Vmax):    2.41 cm AV  Area (Vmean):   2.28 cm AV Area (VTI):     2.35 cm AV Vmax:           193.00 cm/s AV Vmean:          127.000 cm/s AV VTI:            0.365 m AV Peak Grad:      14.9 mmHg AV Mean Grad:      8.0 mmHg LVOT Vmax:         103.00 cm/s LVOT Vmean:        63.900 cm/s LVOT VTI:          0.190 m LVOT/AV VTI ratio: 0.52  AORTA Ao Root diam: 3.80 cm MITRAL VALVE MV Area (PHT): 3.03 cm    SHUNTS MV Decel Time: 250 msec    Systemic VTI:  0.19 m MV E velocity: 72.60 cm/s  Systemic Diam: 2.40 cm MV A velocity: 86.50 cm/s MV E/A ratio:  0.84 Dina Rich MD Electronically signed by Dina Rich MD Signature Date/Time: 11/29/2023/4:58:14 PM    Final    Family Communication: Discussed with patient, he understand and agree. All questions answereed.  Disposition: Status is: Inpatient Remains inpatient appropriate because: diarrhea, advance diet.  Planned Discharge Destination: Home     Time spent: 37 minutes  Author: Marcelino Duster, MD 11/30/2023 10:40  AM Secure chat 7am to 7pm For on call review [email or web address]

## 2023-11-30 NOTE — Plan of Care (Signed)

## 2023-11-30 NOTE — Plan of Care (Signed)
  Problem: Education: Goal: Knowledge of General Education information will improve Description: Including pain rating scale, medication(s)/side effects and non-pharmacologic comfort measures Outcome: Progressing   Problem: Clinical Measurements: Goal: Cardiovascular complication will be avoided Outcome: Progressing   Problem: Activity: Goal: Risk for activity intolerance will decrease Outcome: Progressing   Problem: Pain Managment: Goal: General experience of comfort will improve and/or be controlled Outcome: Progressing

## 2023-12-01 ENCOUNTER — Ambulatory Visit: Payer: Medicare PPO | Admitting: Podiatry

## 2023-12-01 ENCOUNTER — Telehealth (HOSPITAL_COMMUNITY): Payer: Self-pay | Admitting: Family Medicine

## 2023-12-01 DIAGNOSIS — I10 Essential (primary) hypertension: Secondary | ICD-10-CM | POA: Diagnosis not present

## 2023-12-01 DIAGNOSIS — E876 Hypokalemia: Secondary | ICD-10-CM | POA: Diagnosis not present

## 2023-12-01 DIAGNOSIS — A020 Salmonella enteritis: Secondary | ICD-10-CM | POA: Diagnosis not present

## 2023-12-01 DIAGNOSIS — I4729 Other ventricular tachycardia: Secondary | ICD-10-CM | POA: Diagnosis not present

## 2023-12-01 MED ORDER — FUROSEMIDE 20 MG PO TABS
20.0000 mg | ORAL_TABLET | Freq: Once | ORAL | Status: AC
  Administered 2023-12-01: 20 mg via ORAL
  Filled 2023-12-01: qty 1

## 2023-12-01 NOTE — Plan of Care (Signed)

## 2023-12-01 NOTE — Progress Notes (Signed)
 Progress Note   Patient: Jose Meyer DOB: [birth year] DOA: 11/27/2023     3 DOS: the patient was seen and examined on 12/01/2023   Brief hospital course: Jose Meyer is a 78 y.o. male with medical history significant of CAD, hx of CVA, HTN, HLD, hx of colon cancer s/p right hemicolectomy in 2009 who presented to ED for 4 day history of persistent nausea and watery diarrhea.  He states on Monday night he started to have severe diarrhea. Patient tested positive for Salmonella  Assessment and Plan: * Salmonella gastroenteritis Presented with intractable, watery diarrhea, nausea and mild abdominal pain found to have salmonella gastroenteritis with sepsis criteria  CT scan with diffuse colonic wall thickening, especially in descending and sigmoid colon, consistent with colitis. Continue enteric precautions. He is eating fair, stopped IVF  Continue with rocephin daily.  Able to tolerate liquid diet, advance to soft diet.  Ventricular tachycardia, nonsustained (HCC) Secondary to electrolyte derangement/ QT prolongation. Asymptomatic  Replete electrolytes, continue monitoring Continue telemetry. Cardiology follow up appreciated. Echo shows EF 60%, grade 1 dd. No further work up needed.  Prolonged QT interval Optimize electrolytes. Continue telemetry monitoring. Avoid qt prolonging drugs   Hypokalemia Secondary to GI losses in setting of severe diarrhea. Repeat potassium improved.  Essential hypertension Blood better today. Continue metoprolol. Hold hydrochlorothiazide and cozaar. Lower extremity swelling noted, Lasix 20mg  one time ordered.  Personal history of transient ischemic attack (TIA), and cerebral infarction without residual deficits Continue ASA/plavix  Continue lipitor.    Out of bed to chair. Incentive spirometry. Nursing supportive care. Fall, aspiration precautions. DVT prophylaxis   Code Status: Full Code  Subjective: Patient is seen  and examined today morning.continues to have loose stools. Able to tolerate soft diet. Denies abdominal discomfort.  Physical Exam: Vitals:   11/30/23 1540 11/30/23 1921 12/01/23 0332 12/01/23 0900  BP: 115/62 117/72 128/79 102/69  Pulse: 86 98 81 74  Resp: 18 18 17    Temp:  98.7 F (37.1 C) 98.6 F (37 C) 98.6 F (37 C)  TempSrc:  Oral Oral Oral  SpO2: 97% 98% 93% 95%  Weight:        General - Elderly Caucasian male, no acute distress HEENT - PERRLA, EOMI, atraumatic head, non tender sinuses. Lung - Clear, no rales, rhonchi, wheezes. Heart - S1, S2 heard, no murmurs, rubs, trace pedal edema. Abdomen - Soft, non tender, distended, bowel sounds good Neuro - Alert, awake and oriented x 3, non focal exam. Skin - Warm and dry.  Data Reviewed:      Latest Ref Rng & Units 11/29/2023    6:55 AM 11/28/2023    5:40 AM 11/27/2023   12:33 PM  CBC  WBC 4.0 - 10.5 K/uL 5.4  4.7  5.0   Hemoglobin 13.0 - 17.0 g/dL 54.2  70.6  23.7   Hematocrit 39.0 - 52.0 % 40.5  46.2  54.8   Platelets 150 - 400 K/uL 137  130  146       Latest Ref Rng & Units 11/29/2023    6:55 AM 11/28/2023   12:40 PM 11/28/2023    5:40 AM  BMP  Glucose 70 - 99 mg/dL 628  95  315   BUN 8 - 23 mg/dL 13  15  20    Creatinine 0.61 - 1.24 mg/dL 1.76  1.60  7.37   Sodium 135 - 145 mmol/L 136  139  138   Potassium 3.5 - 5.1 mmol/L 3.7  4.0  3.1   Chloride 98 - 111 mmol/L 107  100  102   CO2 22 - 32 mmol/L 22  26  29    Calcium 8.9 - 10.3 mg/dL 7.8  8.6  8.0    ECHOCARDIOGRAM COMPLETE Result Date: 11/29/2023    ECHOCARDIOGRAM REPORT   Patient Name:   Jose Meyer Date of Exam: 11/29/2023 Medical Rec #:  [id number]         Height:       72.0 in Accession #:    [id number]        Weight:       211.2 lb Date of Birth:  1946-09-15         BSA:          2.180 m Patient Age:    77 years          BP:           110/65 mmHg Patient Gender: M                 HR:           80 bpm. Exam Location:  Inpatient Procedure: 2D Echo,  Cardiac Doppler and Color Doppler (Both Spectral and Color            Flow Doppler were utilized during procedure). Indications:    Ventricular Tachycardia I47.2  History:        Patient has prior history of Echocardiogram examinations, most                 recent 09/16/2022. Angina and CAD, Stroke,                 Arrythmias:Tachycardia; Risk Factors:Hypertension and                 Dyslipidemia.  Sonographer:    Lucendia Herrlich RCS Referring Phys: [id number] Lynda Rainwater A ACHARYA IMPRESSIONS  1. Left ventricular ejection fraction, by estimation, is 65 to 70%. The left ventricle has normal function. The left ventricle has no regional wall motion abnormalities. There is moderate left ventricular hypertrophy. Left ventricular diastolic parameters are consistent with Grade I diastolic dysfunction (impaired relaxation).  2. Right ventricular systolic function is normal. The right ventricular size is normal. Tricuspid regurgitation signal is inadequate for assessing PA pressure.  3. The mitral valve is normal in structure. No evidence of mitral valve regurgitation. No evidence of mitral stenosis.  4. The aortic valve is tricuspid. There is severe calcifcation of the aortic valve. There is severe thickening of the aortic valve. Aortic valve regurgitation is not visualized. No aortic stenosis is present. FINDINGS  Left Ventricle: Left ventricular ejection fraction, by estimation, is 65 to 70%. The left ventricle has normal function. The left ventricle has no regional wall motion abnormalities. Strain imaging was not performed. The left ventricular internal cavity  size was normal in size. There is moderate left ventricular hypertrophy. Left ventricular diastolic parameters are consistent with Grade I diastolic dysfunction (impaired relaxation). Normal left ventricular filling pressure. Right Ventricle: The right ventricular size is normal. Right vetricular wall thickness was not well visualized. Right ventricular systolic  function is normal. Tricuspid regurgitation signal is inadequate for assessing PA pressure. Left Atrium: Left atrial size was normal in size. Right Atrium: Right atrial size was normal in size. Pericardium: There is no evidence of pericardial effusion. Mitral Valve: The mitral valve is normal in structure. No evidence of mitral valve regurgitation. No evidence of mitral valve stenosis. Tricuspid Valve: The tricuspid  valve is normal in structure. Tricuspid valve regurgitation is not demonstrated. No evidence of tricuspid stenosis. Aortic Valve: The aortic valve is tricuspid. There is severe calcifcation of the aortic valve. There is severe thickening of the aortic valve. There is severe aortic valve annular calcification. Aortic valve regurgitation is not visualized. No aortic stenosis is present. Aortic valve mean gradient measures 8.0 mmHg. Aortic valve peak gradient measures 14.9 mmHg. Aortic valve area, by VTI measures 2.35 cm. Pulmonic Valve: The pulmonic valve was not well visualized. Pulmonic valve regurgitation is not visualized. No evidence of pulmonic stenosis. Aorta: The aortic root is normal in size and structure. IAS/Shunts: No atrial level shunt detected by color flow Doppler. Additional Comments: 3D imaging was not performed.  LEFT VENTRICLE PLAX 2D LVIDd:         4.30 cm   Diastology LVIDs:         2.80 cm   LV e' medial:    11.30 cm/s LV PW:         1.40 cm   LV E/e' medial:  6.4 LV IVS:        1.40 cm   LV e' lateral:   9.90 cm/s LVOT diam:     2.40 cm   LV E/e' lateral: 7.3 LV SV:         86 LV SV Index:   39 LVOT Area:     4.52 cm  RIGHT VENTRICLE RV S prime:     14.90 cm/s TAPSE (M-mode): 2.3 cm LEFT ATRIUM             Index        RIGHT ATRIUM           Index LA diam:        4.30 cm 1.97 cm/m   RA Area:     11.40 cm LA Vol (A2C):   56.3 ml 25.80 ml/m  RA Volume:   21.70 ml  9.95 ml/m LA Vol (A4C):   72.5 ml 33.25 ml/m LA Biplane Vol: 72.5 ml 33.25 ml/m  AORTIC VALVE AV Area (Vmax):     2.41 cm AV Area (Vmean):   2.28 cm AV Area (VTI):     2.35 cm AV Vmax:           193.00 cm/s AV Vmean:          127.000 cm/s AV VTI:            0.365 m AV Peak Grad:      14.9 mmHg AV Mean Grad:      8.0 mmHg LVOT Vmax:         103.00 cm/s LVOT Vmean:        63.900 cm/s LVOT VTI:          0.190 m LVOT/AV VTI ratio: 0.52  AORTA Ao Root diam: 3.80 cm MITRAL VALVE MV Area (PHT): 3.03 cm    SHUNTS MV Decel Time: 250 msec    Systemic VTI:  0.19 m MV E velocity: 72.60 cm/s  Systemic Diam: 2.40 cm MV A velocity: 86.50 cm/s MV E/A ratio:  0.84 Dina Rich MD Electronically signed by Dina Rich MD Signature Date/Time: 11/29/2023/4:58:14 PM    Final    Family Communication: Discussed with patient, he understand and agree. All questions answereed.  Disposition: Status is: Inpatient Remains inpatient appropriate because: loose stools, advance diet.  Planned Discharge Destination: Home     Time spent: 38 minutes  Author: Marcelino Duster, MD 12/01/2023 1:54 PM  Secure chat 7am to 7pm For on call review [email or web address].

## 2023-12-02 DIAGNOSIS — R9431 Abnormal electrocardiogram [ECG] [EKG]: Secondary | ICD-10-CM | POA: Diagnosis not present

## 2023-12-02 DIAGNOSIS — K529 Noninfective gastroenteritis and colitis, unspecified: Secondary | ICD-10-CM | POA: Diagnosis not present

## 2023-12-02 DIAGNOSIS — I4729 Other ventricular tachycardia: Secondary | ICD-10-CM | POA: Diagnosis not present

## 2023-12-02 DIAGNOSIS — A020 Salmonella enteritis: Secondary | ICD-10-CM | POA: Diagnosis not present

## 2023-12-02 LAB — CULTURE, BLOOD (ROUTINE X 2)
Culture: NO GROWTH
Special Requests: ADEQUATE

## 2023-12-02 MED ORDER — TAMSULOSIN HCL 0.4 MG PO CAPS: 0.4000 mg | ORAL_CAPSULE | Freq: Every day | ORAL | 3 refills | Status: DC

## 2023-12-02 MED ORDER — TAMSULOSIN HCL 0.4 MG PO CAPS
0.4000 mg | ORAL_CAPSULE | Freq: Every day | ORAL | Status: DC
  Administered 2023-12-02: 0.4 mg via ORAL
  Filled 2023-12-02: qty 1

## 2023-12-02 NOTE — Discharge Summary (Signed)
 Physician Discharge Summary   Patient: Jose Meyer MRN: [id number] DOB: 09/14/1946  Admit date:     11/27/2023  Discharge date: {dischdate:26783}  Discharge Physician: Jose Meyer   PCP: Jose Has, MD   Recommendations at discharge:  {Tip this will not be part of the note when signed- Example include specific recommendations for outpatient follow-up, pending tests to follow-up on. (Optional):26781}  ***  Discharge Diagnoses: Principal Problem:   Salmonella gastroenteritis Active Problems:   Ventricular tachycardia, nonsustained (HCC)   Prolonged QT interval   Hypokalemia   Essential hypertension   Personal history of transient ischemic attack (TIA), and cerebral infarction without residual deficits   Colitis   Coronary artery disease involving native coronary artery of native heart without angina pectoris   NSVT (nonsustained ventricular tachycardia) (HCC)  Resolved Problems:   * No resolved hospital problems. *  Hospital Course: No notes on file  Assessment and Plan: * Salmonella gastroenteritis 78 year old presenting with 4 day history of intractable, watery diarrhea, nausea and mild abdominal pain found to have salmonella gastroenteritis with sepsis criteria  -obs to tele -bolused 2.5L in ED, continue IVF  -lactic acidosis resolved  -CT scan with diffuse colonic wall thickening, especially in descending and sigmoid colon, consistent with colitis. -GI PCR stool: +salmonella -C.diff pending  -enteric precautions -continue with rocephin  -continue with IVF   Ventricular tachycardia, nonsustained (HCC) Likely secondary to electrolyte derangement/qt prolongation. Asymptomatic  Replete electrolytes, continue monitoring No more episodes reported since last night with 10 second run Troponin 18>6 Repeat EKG pending   Prolonged QT interval Optimize electrolytes Keep on telemetry Avoid qt prolonging drugs  Repeat ekg in AM     Hypokalemia Secondary to exogenous losses in setting of salmonella gastroenteritis Repleted in ED Repeat potassium level today Trend  Magnesium wnl   Essential hypertension Blood pressure have been soft to normotensive  Hold his hydrochlorothiazide and cozaar Will start back his toprol tonight if bp remain stable   Personal history of transient ischemic attack (TIA), and cerebral infarction without residual deficits Continue ASA/plavix  Continue lipitor       {Tip this will not be part of the note when signed Body mass index is 28.64 kg/m. , ,  (Optional):26781}  {(NOTE) Pain control PDMP Statment (Optional):26782} Consultants: *** Procedures performed: ***  Disposition: {Plan; Disposition:26390} Diet recommendation:  Discharge Diet Orders (From admission, onward)     Start     Ordered   12/02/23 0000  Diet - low sodium heart healthy        12/02/23 1031           {Diet_Plan:26776} DISCHARGE MEDICATION: Allergies as of 12/02/2023   No Known Allergies      Medication List     TAKE these medications    aspirin 81 MG chewable tablet Chew 81 mg by mouth daily.   atorvastatin 80 MG tablet Commonly known as: LIPITOR Take 1 tablet (80 mg total) by mouth daily.   betamethasone dipropionate 0.05 % lotion Apply 0.05 application topically as needed. For the scalp after shower   clopidogrel 75 MG tablet Commonly known as: PLAVIX Take 1 tablet (75 mg total) by mouth daily.   diclofenac Sodium 1 % Gel Commonly known as: VOLTAREN 1 application   Elidel 1 % cream Generic drug: pimecrolimus Apply 1 application topically as needed. For skin   fluticasone 50 MCG/ACT nasal spray Commonly known as: FLONASE Place 1 spray into both nostrils as needed for allergies or rhinitis.  furosemide 20 MG tablet Commonly known as: LASIX Take 1 tablet (20 mg total) by mouth daily as needed.   hydrochlorothiazide 25 MG tablet Commonly known as: HYDRODIURIL Take 1  tablet (25 mg total) by mouth daily. 1 tablet in the morning   loperamide 2 MG tablet Commonly known as: IMODIUM A-D Take 2 mg by mouth 4 (four) times daily as needed for diarrhea or loose stools.   losartan 25 MG tablet Commonly known as: COZAAR Take 1 tablet (25 mg total) by mouth daily.   methocarbamol 500 MG tablet Commonly known as: ROBAXIN TK 1 T PO Q 6 H PRF BACK SPASMS   metoprolol succinate 100 MG 24 hr tablet Commonly known as: TOPROL-XL Take 0.5 tablets (50 mg total) by mouth every morning AND 1 tablet (100 mg total) every evening. Take with or immediately following a meal..   Systane Ultra 0.4-0.3 % Soln Generic drug: Polyethyl Glycol-Propyl Glycol   tamsulosin 0.4 MG Caps capsule Commonly known as: FLOMAX Take 1 capsule (0.4 mg total) by mouth daily.   Wegovy 0.25 MG/0.5ML Soaj Generic drug: Semaglutide-Weight Management Inject 0.25 mg into the skin once a week. FOR 4 WEEKS   Wegovy 0.5 MG/0.5ML Soaj Generic drug: Semaglutide-Weight Management Inject 0.5 mg into the skin once a week. FOR 4 WEEKS WHEN YOU FINISH THE 0.25 MG   Wegovy 1 MG/0.5ML Soaj Generic drug: Semaglutide-Weight Management Inject 1 mg into the skin once a week. WHEN YOU FINISH THE 0.5 MG        Follow-up Information     Jose Morale, MD Follow up.   Specialty: Cardiology Why: The office will call Contact information: [address] [place] [address] [phone]                Discharge Exam: Ceasar Mons Weights   11/27/23 1149 11/29/23 0111  Weight: 96.2 kg 95.8 kg   ***  Condition at discharge: {DC Condition:26389}  The results of significant diagnostics from this hospitalization (including imaging, microbiology, ancillary and laboratory) are listed below for reference.   Imaging Studies: ECHOCARDIOGRAM COMPLETE Result Date: 11/29/2023    ECHOCARDIOGRAM REPORT   Patient Name:   Jose Meyer Date of Exam: 11/29/2023 Medical Rec #:  [id number]         Height:        72.0 in Accession #:    [id number]        Weight:       211.2 lb Date of Birth:  September 21, 1946         BSA:          2.180 m Patient Age:    77 years          BP:           110/65 mmHg Patient Gender: M                 HR:           80 bpm. Exam Location:  Inpatient Procedure: 2D Echo, Cardiac Doppler and Color Doppler (Both Spectral and Color            Flow Doppler were utilized during procedure). Indications:    Ventricular Tachycardia I47.2  History:        Patient Meyer prior history of Echocardiogram examinations, most                 recent 09/16/2022. Angina and CAD, Stroke,  Arrythmias:Tachycardia; Risk Factors:Hypertension and                 Dyslipidemia.  Sonographer:    Lucendia Herrlich RCS Referring Phys: [id number] Lynda Rainwater A ACHARYA IMPRESSIONS  1. Left ventricular ejection fraction, by estimation, is 65 to 70%. The left ventricle Meyer normal function. The left ventricle Meyer no regional wall motion abnormalities. There is moderate left ventricular hypertrophy. Left ventricular diastolic parameters are consistent with Grade I diastolic dysfunction (impaired relaxation).  2. Right ventricular systolic function is normal. The right ventricular size is normal. Tricuspid regurgitation signal is inadequate for assessing PA pressure.  3. The mitral valve is normal in structure. No evidence of mitral valve regurgitation. No evidence of mitral stenosis.  4. The aortic valve is tricuspid. There is severe calcifcation of the aortic valve. There is severe thickening of the aortic valve. Aortic valve regurgitation is not visualized. No aortic stenosis is present. FINDINGS  Left Ventricle: Left ventricular ejection fraction, by estimation, is 65 to 70%. The left ventricle Meyer normal function. The left ventricle Meyer no regional wall motion abnormalities. Strain imaging was not performed. The left ventricular internal cavity  size was normal in size. There is moderate left ventricular hypertrophy. Left  ventricular diastolic parameters are consistent with Grade I diastolic dysfunction (impaired relaxation). Normal left ventricular filling pressure. Right Ventricle: The right ventricular size is normal. Right vetricular wall thickness was not well visualized. Right ventricular systolic function is normal. Tricuspid regurgitation signal is inadequate for assessing PA pressure. Left Atrium: Left atrial size was normal in size. Right Atrium: Right atrial size was normal in size. Pericardium: There is no evidence of pericardial effusion. Mitral Valve: The mitral valve is normal in structure. No evidence of mitral valve regurgitation. No evidence of mitral valve stenosis. Tricuspid Valve: The tricuspid valve is normal in structure. Tricuspid valve regurgitation is not demonstrated. No evidence of tricuspid stenosis. Aortic Valve: The aortic valve is tricuspid. There is severe calcifcation of the aortic valve. There is severe thickening of the aortic valve. There is severe aortic valve annular calcification. Aortic valve regurgitation is not visualized. No aortic stenosis is present. Aortic valve mean gradient measures 8.0 mmHg. Aortic valve peak gradient measures 14.9 mmHg. Aortic valve area, by VTI measures 2.35 cm. Pulmonic Valve: The pulmonic valve was not well visualized. Pulmonic valve regurgitation is not visualized. No evidence of pulmonic stenosis. Aorta: The aortic root is normal in size and structure. IAS/Shunts: No atrial level shunt detected by color flow Doppler. Additional Comments: 3D imaging was not performed.  LEFT VENTRICLE PLAX 2D LVIDd:         4.30 cm   Diastology LVIDs:         2.80 cm   LV e' medial:    11.30 cm/s LV PW:         1.40 cm   LV E/e' medial:  6.4 LV IVS:        1.40 cm   LV e' lateral:   9.90 cm/s LVOT diam:     2.40 cm   LV E/e' lateral: 7.3 LV SV:         86 LV SV Index:   39 LVOT Area:     4.52 cm  RIGHT VENTRICLE RV S prime:     14.90 cm/s TAPSE (M-mode): 2.3 cm LEFT ATRIUM              Index        RIGHT ATRIUM  Index LA diam:        4.30 cm 1.97 cm/m   RA Area:     11.40 cm LA Vol (A2C):   56.3 ml 25.80 ml/m  RA Volume:   21.70 ml  9.95 ml/m LA Vol (A4C):   72.5 ml 33.25 ml/m LA Biplane Vol: 72.5 ml 33.25 ml/m  AORTIC VALVE AV Area (Vmax):    2.41 cm AV Area (Vmean):   2.28 cm AV Area (VTI):     2.35 cm AV Vmax:           193.00 cm/s AV Vmean:          127.000 cm/s AV VTI:            0.365 m AV Peak Grad:      14.9 mmHg AV Mean Grad:      8.0 mmHg LVOT Vmax:         103.00 cm/s LVOT Vmean:        63.900 cm/s LVOT VTI:          0.190 m LVOT/AV VTI ratio: 0.52  AORTA Ao Root diam: 3.80 cm MITRAL VALVE MV Area (PHT): 3.03 cm    SHUNTS MV Decel Time: 250 msec    Systemic VTI:  0.19 m MV E velocity: 72.60 cm/s  Systemic Diam: 2.40 cm MV A velocity: 86.50 cm/s MV E/A ratio:  0.84 Dina Rich MD Electronically signed by Dina Rich MD Signature Date/Time: 11/29/2023/4:58:14 PM    Final    DG Chest Portable 1 View Result Date: 11/27/2023 CLINICAL DATA:  Tachycardia and headache.  SIRS. EXAM: PORTABLE CHEST 1 VIEW COMPARISON:  Chest radiograph dated 10/02/2016. FINDINGS: Minimal left lung base atelectasis. No focal consolidation, pleural effusion, or pneumothorax. Stable cardiac silhouette. No acute osseous pathology. IMPRESSION: No active disease. Electronically Signed   By: Elgie Collard M.D.   On: 11/27/2023 16:55   CT ABDOMEN PELVIS W CONTRAST Result Date: 11/27/2023 CLINICAL DATA:  Bowel obstruction suspected. Diarrhea for 3 days. Tachycardia with headache and unsteady gait. EXAM: CT ABDOMEN AND PELVIS WITH CONTRAST TECHNIQUE: Multidetector CT imaging of the abdomen and pelvis was performed using the standard protocol following bolus administration of intravenous contrast. RADIATION DOSE REDUCTION: This exam was performed according to the departmental dose-optimization program which includes automated exposure control, adjustment of the mA and/or kV according  to patient size and/or use of iterative reconstruction technique. CONTRAST:  OMNIPAQUE IOHEXOL 300 MG/ML  SOLN COMPARISON:  Abdominopelvic CT 03/18/2013. FINDINGS: Lower chest: Mild atelectasis or scarring at both lung bases, similar to previous CT. No significant pleural or pericardial effusion. Extensive atherosclerosis of the aorta and coronary arteries with calcifications of the aortic valve. Hepatobiliary: The liver is normal in density without suspicious focal abnormality. No evidence of gallstones, gallbladder wall thickening or biliary dilatation. Pancreas: Diffuse fatty replacement. No focal abnormality, ductal dilatation or surrounding inflammation. Spleen: Scattered calcified granulomas. No splenomegaly or other focal abnormality. Adrenals/Urinary Tract: Both adrenal glands appear normal. No evidence of urinary tract calculus, suspicious renal lesion or hydronephrosis. Small right renal cysts, similar to previous study. No specific follow-up imaging recommended. The bladder appears unremarkable for its degree of distention. Stomach/Bowel: No enteric contrast administered. The stomach appears unremarkable for its degree of distention. There is no small bowel distension, wall thickening or surrounding inflammation. Postsurgical changes consistent with previous right hemicolectomy and ileocolonic anastomosis. There are moderate diverticular changes throughout the colon. There is new diffuse colonic wall thickening, especially in the descending and  sigmoid colon. No focal pericolonic inflammation or extraluminal gas identified. No evidence of bowel obstruction or perforation. Vascular/Lymphatic: There are no enlarged abdominal or pelvic lymph nodes. Aortic and branch vessel atherosclerosis. No evidence of aneurysm or large vessel occlusion. The portal, superior mesenteric and splenic veins are patent. Reproductive: Mild enlargement of the prostate gland, including the median lobe which protrudes into  the bladder base. Other: No evidence of abdominal wall mass or hernia. No ascites or pneumoperitoneum. Musculoskeletal: No acute or significant osseous findings. Mild multilevel spondylosis stable asymmetric spurring of the right femoral lesser trochanter which may be posttraumatic. IMPRESSION: 1. New diffuse colonic wall thickening, especially in the descending and sigmoid colon, consistent with colitis. This could be infectious or inflammatory. Correlate clinically. 2. Diffuse colonic diverticulosis without evidence of focal inflammation to suggest diverticulitis. No evidence of bowel obstruction or perforation. 3. No other acute findings. 4.  Aortic Atherosclerosis (ICD10-I70.0). Electronically Signed   By: Carey Bullocks M.D.   On: 11/27/2023 16:12    Microbiology: Results for orders placed or performed during the hospital encounter of 11/27/23  Resp panel by RT-PCR (RSV, Flu A&B, Covid) Anterior Nasal Swab     Status: None   Collection Time: 11/27/23 12:33 PM   Specimen: Anterior Nasal Swab  Result Value Ref Range Status   SARS Coronavirus 2 by RT PCR NEGATIVE NEGATIVE Final    Comment: (NOTE) SARS-CoV-2 target nucleic acids are NOT DETECTED.  The SARS-CoV-2 RNA is generally detectable in upper respiratory specimens during the acute phase of infection. The lowest concentration of SARS-CoV-2 viral copies this assay can detect is 138 copies/mL. A negative result does not preclude SARS-Cov-2 infection and should not be used as the sole basis for treatment or other patient management decisions. A negative result may occur with  improper specimen collection/handling, submission of specimen other than nasopharyngeal swab, presence of viral mutation(s) within the areas targeted by this assay, and inadequate number of viral copies(<138 copies/mL). A negative result must be combined with clinical observations, patient history, and epidemiological information. The expected result is  Negative.  Fact Sheet for Patients:  BloggerCourse.com  Fact Sheet for Healthcare Providers:  SeriousBroker.it  This test is no t yet approved or cleared by the [place] FDA and  Meyer been authorized for detection and/or diagnosis of SARS-CoV-2 by FDA under an Emergency Use Authorization (EUA). This EUA will remain  in effect (meaning this test can be used) for the duration of the COVID-19 declaration under Section 564(b)(1) of the Act, 21 U.S.C.section 360bbb-3(b)(1), unless the authorization is terminated  or revoked sooner.       Influenza A by PCR NEGATIVE NEGATIVE Final   Influenza B by PCR NEGATIVE NEGATIVE Final    Comment: (NOTE) The Xpert Xpress SARS-CoV-2/FLU/RSV plus assay is intended as an aid in the diagnosis of influenza from Nasopharyngeal swab specimens and should not be used as a sole basis for treatment. Nasal washings and aspirates are unacceptable for Xpert Xpress SARS-CoV-2/FLU/RSV testing.  Fact Sheet for Patients: BloggerCourse.com  Fact Sheet for Healthcare Providers: SeriousBroker.it  This test is not yet approved or cleared by the [place] FDA and Meyer been authorized for detection and/or diagnosis of SARS-CoV-2 by FDA under an Emergency Use Authorization (EUA). This EUA will remain in effect (meaning this test can be used) for the duration of the COVID-19 declaration under Section 564(b)(1) of the Act, 21 U.S.C. section 360bbb-3(b)(1), unless the authorization is terminated or revoked.     Resp Syncytial  Virus by PCR NEGATIVE NEGATIVE Final    Comment: (NOTE) Fact Sheet for Patients: BloggerCourse.com  Fact Sheet for Healthcare Providers: SeriousBroker.it  This test is not yet approved or cleared by the [place] FDA and Meyer been authorized for detection and/or diagnosis of  SARS-CoV-2 by FDA under an Emergency Use Authorization (EUA). This EUA will remain in effect (meaning this test can be used) for the duration of the COVID-19 declaration under Section 564(b)(1) of the Act, 21 U.S.C. section 360bbb-3(b)(1), unless the authorization is terminated or revoked.  Performed at [facility], [address] [place], [place] [address]   Culture, blood (routine x 2)     Status: None   Collection Time: 11/27/23 12:45 PM   Specimen: BLOOD  Result Value Ref Range Status   Specimen Description   Final    BLOOD RIGHT ANTECUBITAL Performed at [facility], [address] [place], [place] [address]    Special Requests   Final    BOTTLES DRAWN AEROBIC AND ANAEROBIC Blood Culture adequate volume Performed at [facility], [address] [place], [place] [address]    Culture   Final    NO GROWTH 5 DAYS Performed at [facility], [address], [place], [place] [address]    Report Status 12/02/2023 FINAL  Final  Culture, blood (routine x 2)     Status: Abnormal   Collection Time: 11/27/23 12:56 PM   Specimen: BLOOD  Result Value Ref Range Status   Specimen Description   Final    BLOOD LEFT ANTECUBITAL Performed at [facility], [address], [place], [place] [address]    Special Requests   Final    BOTTLES DRAWN AEROBIC AND ANAEROBIC Blood Culture adequate volume Performed at [facility], [address], [place], [place] [address]    Culture  Setup Time   Final    GRAM POSITIVE COCCI IN CLUSTERS ANAEROBIC BOTTLE ONLY CRITICAL RESULT CALLED TO, READ BACK BY AND VERIFIED WITH: PHARMD JENNY V. [id number] FCP    Culture (A)  Final    STAPHYLOCOCCUS CAPITIS THE SIGNIFICANCE OF ISOLATING THIS ORGANISM FROM A SINGLE SET OF BLOOD CULTURES WHEN MULTIPLE SETS ARE DRAWN IS UNCERTAIN. PLEASE NOTIFY THE MICROBIOLOGY DEPARTMENT WITHIN ONE WEEK IF SPECIATION AND  SENSITIVITIES ARE REQUIRED. Performed at [facility], [address], [place], [place] [address]    Report Status 11/30/2023 FINAL  Final  Blood Culture ID Panel (Reflexed)     Status: Abnormal   Collection Time: 11/27/23 12:56 PM  Result Value Ref Range Status   Enterococcus faecalis NOT DETECTED NOT DETECTED Final   Enterococcus Faecium NOT DETECTED NOT DETECTED Final   Listeria monocytogenes NOT DETECTED NOT DETECTED Final   Staphylococcus species DETECTED (A) NOT DETECTED Final    Comment: CRITICAL RESULT CALLED TO, READ BACK BY AND VERIFIED WITH: PHARMD JENNY V. [id number] FCP    Staphylococcus aureus (BCID) NOT DETECTED NOT DETECTED Final   Staphylococcus epidermidis NOT DETECTED NOT DETECTED Final   Staphylococcus lugdunensis NOT DETECTED NOT DETECTED Final   Streptococcus species NOT DETECTED NOT DETECTED Final   Streptococcus agalactiae NOT DETECTED NOT DETECTED Final   Streptococcus pneumoniae NOT DETECTED NOT DETECTED Final   Streptococcus pyogenes NOT DETECTED NOT DETECTED Final   A.calcoaceticus-baumannii NOT DETECTED NOT DETECTED Final   Bacteroides fragilis NOT DETECTED NOT DETECTED Final   Enterobacterales NOT DETECTED NOT DETECTED Final   Enterobacter cloacae complex NOT DETECTED NOT DETECTED  Final   Escherichia coli NOT DETECTED NOT DETECTED Final   Klebsiella aerogenes NOT DETECTED NOT DETECTED Final   Klebsiella oxytoca NOT DETECTED NOT DETECTED Final   Klebsiella pneumoniae NOT DETECTED NOT DETECTED Final   Proteus species NOT DETECTED NOT DETECTED Final   Salmonella species NOT DETECTED NOT DETECTED Final   Serratia marcescens NOT DETECTED NOT DETECTED Final   Haemophilus influenzae NOT DETECTED NOT DETECTED Final   Neisseria meningitidis NOT DETECTED NOT DETECTED Final   Pseudomonas aeruginosa NOT DETECTED NOT DETECTED Final   Stenotrophomonas maltophilia NOT DETECTED NOT DETECTED Final   Candida albicans NOT DETECTED NOT DETECTED Final   Candida  auris NOT DETECTED NOT DETECTED Final   Candida glabrata NOT DETECTED NOT DETECTED Final   Candida krusei NOT DETECTED NOT DETECTED Final   Candida parapsilosis NOT DETECTED NOT DETECTED Final   Candida tropicalis NOT DETECTED NOT DETECTED Final   Cryptococcus neoformans/gattii NOT DETECTED NOT DETECTED Final    Comment: Performed at [facility], [address] [place], [place] [address]  Gastrointestinal Panel by PCR , Stool     Status: Abnormal   Collection Time: 11/27/23  7:48 PM   Specimen: Stool  Result Value Ref Range Status   Campylobacter species NOT DETECTED NOT DETECTED Final   Plesimonas shigelloides NOT DETECTED NOT DETECTED Final   Salmonella species DETECTED (A) NOT DETECTED Final    Comment: RESULT CALLED TO, READ BACK BY AND VERIFIED WITH: KRISTA CHAPLIN AT 1025 11/28/23.PMF    Yersinia enterocolitica NOT DETECTED NOT DETECTED Final   Vibrio species NOT DETECTED NOT DETECTED Final   Vibrio cholerae NOT DETECTED NOT DETECTED Final   Enteroaggregative E coli (EAEC) NOT DETECTED NOT DETECTED Final   Enteropathogenic E coli (EPEC) NOT DETECTED NOT DETECTED Final   Enterotoxigenic E coli (ETEC) NOT DETECTED NOT DETECTED Final   Shiga like toxin producing E coli (STEC) NOT DETECTED NOT DETECTED Final   Shigella/Enteroinvasive E coli (EIEC) NOT DETECTED NOT DETECTED Final   Cryptosporidium NOT DETECTED NOT DETECTED Final   Cyclospora cayetanensis NOT DETECTED NOT DETECTED Final   Entamoeba histolytica NOT DETECTED NOT DETECTED Final   Giardia lamblia NOT DETECTED NOT DETECTED Final   Adenovirus F40/41 NOT DETECTED NOT DETECTED Final   Astrovirus NOT DETECTED NOT DETECTED Final   Norovirus GI/GII NOT DETECTED NOT DETECTED Final   Rotavirus A NOT DETECTED NOT DETECTED Final   Sapovirus (I, II, IV, and V) NOT DETECTED NOT DETECTED Final    Comment: Performed at [facility], [address] [place], [place] [address]  C Difficile Quick Screen w PCR reflex      Status: None   Collection Time: 11/27/23  7:48 PM   Specimen: STOOL  Result Value Ref Range Status   C Diff antigen NEGATIVE NEGATIVE Final   C Diff toxin NEGATIVE NEGATIVE Final   C Diff interpretation No C. difficile detected.  Final    Comment: Performed at [facility], [address] [place], [place] [address]    Labs: CBC: Recent Labs  Lab 11/27/23 1233 11/28/23 0540 11/29/23 0655  WBC 5.0 4.7 5.4  NEUTROABS  --  2.6  --   HGB 19.0* 16.2 13.7  HCT 54.8* 46.2 40.5  MCV 91.8 93.3 94.4  PLT 146* 130* 137*   Basic Metabolic Panel: Recent Labs  Lab 11/27/23 1233 11/28/23 0540 11/28/23 1240 11/29/23 0655  NA 136 138 139 136  K 3.1* 3.1* 4.0 3.7  CL 96*  102 100 107  CO2 28 29 26 22   GLUCOSE 150* 109* 95 102*  BUN 24* 20 15 13   CREATININE 1.23 0.89 0.96 0.79  CALCIUM 9.1 8.0* 8.6* 7.8*  MG 1.8 1.8  --  2.0   Liver Function Tests: Recent Labs  Lab 11/27/23 1233 11/28/23 0540  AST 54* 41  ALT 40 34  ALKPHOS 44 35*  BILITOT 1.9* 1.1  PROT 6.7 5.3*  ALBUMIN 3.7 2.9*   CBG: No results for input(s): "GLUCAP" in the last 168 hours.  Discharge time spent: {LESS THAN/GREATER ZOXW:96045} 30 minutes.  Signed: Marcelino Duster, MD [facility] 12/02/2023

## 2023-12-02 NOTE — Plan of Care (Signed)

## 2023-12-08 ENCOUNTER — Ambulatory Visit: Payer: Medicare PPO | Admitting: Podiatry

## 2023-12-08 ENCOUNTER — Encounter: Payer: Self-pay | Admitting: Podiatry

## 2023-12-08 VITALS — Ht 72.0 in | Wt 211.2 lb

## 2023-12-08 DIAGNOSIS — B351 Tinea unguium: Secondary | ICD-10-CM

## 2023-12-08 DIAGNOSIS — D689 Coagulation defect, unspecified: Secondary | ICD-10-CM

## 2023-12-08 DIAGNOSIS — M79674 Pain in right toe(s): Secondary | ICD-10-CM | POA: Diagnosis not present

## 2023-12-08 DIAGNOSIS — M79675 Pain in left toe(s): Secondary | ICD-10-CM

## 2023-12-08 NOTE — Progress Notes (Signed)
  Subjective:  Patient ID: Jose Meyer, male    DOB: 1946-03-09,  MRN: [id number]  78 y.o. male presents to clinic with  at risk foot care with h/o clotting disorder and painful mycotic toenails x 10 which interfere with daily activities. Pain is relieved with periodic professional debridement. Patient has been hospitalized since our last visit. States he had food poisoning and is still recovering. Chief Complaint  Patient presents with   Nail Problem    Pt is here for [facility] PCP is Dr Kateri Plummer and LOV was in September.   New problem(s): None   PCP is Farris Has, MD.  No Known Allergies  Review of Systems: Negative except as noted in the HPI.   Objective:  Jose Meyer is a pleasant 78 y.o. male in NAD. AAO x 3.  Vascular Examination: Vascular status intact b/l with palpable pedal pulses. CFT immediate b/l. No edema. No pain with calf compression b/l. Skin temperature gradient WNL b/l. No cyanosis or clubbing noted b/l LE.  Neurological Examination: Sensation grossly intact b/l with 10 gram monofilament. Vibratory sensation intact b/l.   Dermatological Examination: Pedal skin with normal turgor, texture and tone b/l. Toenails 1-5 b/l thick, discolored, elongated with subungual debris and pain on dorsal palpation. No corns, calluses nor porokeratotic lesions noted. Incurvated nailplate right great toe medial border(s) with tenderness to palpation. No erythema, no edema, no drainage noted.   Musculoskeletal Examination: Muscle strength 5/5 to b/l LE. Hammertoe deformity noted 1-5 b/l.  Radiographs: None  Last A1c:       No data to display           Assessment:   1. Pain due to onychomycosis of toenails of both feet   2. Clotting disorder [facility])    Plan:  Patient was evaluated and treated. All patient's and/or POA's questions/concerns addressed on today's visit. Mycotic toenails 1-5 debrided in length and girth without incident.  No invasive procedure(s)  performed. Offending nail border debrided and curretaged medial border right hallux utilizing sterile nail nipper and currette. Border cleansed with alcohol and triple antibiotic ointment applied. No further treatment required by patient/caregiver. Call office if there are any concerns.  Continue soft, supportive shoe gear daily. Report any pedal injuries to medical professional. Patient/POA to call should there be question/concern in the interim.  Return in about 3 months (around 03/06/2024).  [name], DPM      [place] LOCATION: [address] [place] [address]                   Office [phone]   [facility] LOCATION: [address] [place], [place] [address] Office [phone]

## 2023-12-09 DIAGNOSIS — I10 Essential (primary) hypertension: Secondary | ICD-10-CM | POA: Diagnosis not present

## 2023-12-09 DIAGNOSIS — R7303 Prediabetes: Secondary | ICD-10-CM | POA: Diagnosis not present

## 2023-12-09 DIAGNOSIS — A020 Salmonella enteritis: Secondary | ICD-10-CM | POA: Diagnosis not present

## 2023-12-09 DIAGNOSIS — R195 Other fecal abnormalities: Secondary | ICD-10-CM | POA: Diagnosis not present

## 2023-12-09 DIAGNOSIS — Z09 Encounter for follow-up examination after completed treatment for conditions other than malignant neoplasm: Secondary | ICD-10-CM | POA: Diagnosis not present

## 2023-12-12 ENCOUNTER — Telehealth (HOSPITAL_COMMUNITY): Payer: Self-pay

## 2023-12-12 NOTE — Telephone Encounter (Signed)
 Called and left patient a voice message to confirm/remind patient of their appointment at the [facility] on 12/15/23.   And to bring in all medications and/or complete list.

## 2023-12-14 NOTE — Progress Notes (Signed)
 Patient ID: Jose Meyer, male   DOB: Jul 25, 1946, 78 y.o.   MRN: [id number]     ADVANCED HF CLINIC NOTE  PCP: Dr. Shaune Pollack Cardiology: Dr. Shirlee Latch  Chief Complaint: Heart Failure Follow up  78 y.o. with past medical history of nonobstructive CAD, HTN, hyperlipidemia, HTN, CVA, and colon cancer s/p R hemicolectomy in 2009.    TEE in 9/10 showing no PFO, normal LV and RV systolic function, grade III plaque in the aortic arch/descending thoracic aorta. Patient has known nonobstructive CAD from cath in 2007.  Echo in 9/13 showed normal EF.    Patient had an inferior STEMI in 11/23 while on vacation in [place], [place].  He was taken to [facility] where inferior STEMI was noted.  He had cath with 99% stenosis proximal RCA treated with DES. Echo showed EF >70% with no WMAs, poor RV visualization.   Echo in 12/23 showed EF 65-70%, no wall motion abnormalities, normal RV.      *** 08/04/23: He returns today for followup of CAD.  Weight up 4 lbs.  SBP < 130 when he checks at home.  Feels good overall, walking for up to 3 miles without exertional dyspnea or chest pain.  No lightheadedness or palpitations.  Having trouble with left rotator cuff arthritis, will likely need eventual surgery.   Admitted 11/27/23 for Salmonella gastritis, during admission developed VT d/t possible electrolyte imbalance (remained asymptomatic). Echo 65-70%, G1DD, normal RV.   Today he returns for HF follow up. Overall feeling fine. Denies increasing SOB, palpitations, abnormal bleeding, CP, dizziness, edema, or PND/Orthopnea. Appetite ok. No fever or chills. Weight at home 170 pounds. Taking all medications.      ECG (personally reviewed): NSR, normal   Past Medical History: 1. Hyperlipidemia 2. Cerebellar CVA 2000: documented by MRI.  Possible TIA 7/10 with vertigo.  MRI 7/10 showed only small vessel disease.  Possible TIA 7/10.  Carotid dopplers (9/10) with no significant disease.  TEE (9/10): Normal LV and RV size  and systolic function.  No PFO.  No significant valvular disease.  At least grade III plaque in the arch and descending thoracic aorta with no ulceration or mobility.  3. HTN 4. History of colon CA s/p R hemicolectomy in 9/09.  5. psoriasis 6. diverticulosis 7. CAD: nonobstructive.  LHC in 2000 without significant disease.  ETT-cardiolite 2007 with fixed inferior defect.  LHC (4/07) showed EF 60%, 30-40% CFX stenosis, 30-40% mRCA stenosis.  Probably false positive cardiolite.  Echo (9/13): EF 55-60%, mild LVH, grade I diastolic dysfunction, mild to moderate LAE. - Cardiolite (2/18): Medium-sized, fixed inferolateral perfusion defect (likely artifact given normal wall motion), no ischemia, EF 64%.  - Inferior STEMI (11/23) with 99% proximal RCA treated with DES. Echo in 11/23 with EF > 70%, no WMAs, RV poorly visualized.  8. Palpitations: PVCs.  30 day event monitor in 9/13 with occasional PVCs but no more significant arrhythmias.   9. Chronic low back pain.  10. Plantar fasciitis 11. Psoriasis 12. Basal cell carcinoma  Family History: No premature CAD.   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 Lives in [place].  No smoking since 1975.  Occasional ETOH.  Married with 2 children [facility], cardiologist in [place]).  Enjoys woodworking.  ROS: All systems reviewed and negative except as per HPI.   Current Outpatient Medications  Medication Sig Dispense Refill   aspirin 81 MG chewable tablet Chew 81 mg by mouth daily.  atorvastatin (LIPITOR) 80 MG tablet Take 1 tablet (80 mg total) by mouth daily. 90 tablet 3   betamethasone dipropionate 0.05 % lotion Apply 0.05 application topically as needed. For the scalp after shower  0   clopidogrel (PLAVIX) 75 MG tablet Take 1 tablet (75 mg total) by mouth daily. 90 tablet 3   ELIDEL 1 % cream Apply 1 application topically as needed. For skin  0   fluticasone (FLONASE) 50 MCG/ACT nasal  spray Place 1 spray into both nostrils as needed for allergies or rhinitis.     furosemide (LASIX) 20 MG tablet Take 1 tablet (20 mg total) by mouth daily as needed. 30 tablet 11   hydrochlorothiazide (HYDRODIURIL) 25 MG tablet Take 1 tablet (25 mg total) by mouth daily. 1 tablet in the morning 90 tablet 3   losartan (COZAAR) 25 MG tablet Take 1 tablet (25 mg total) by mouth daily. 90 tablet 3   methocarbamol (ROBAXIN) 500 MG tablet TK 1 T PO Q 6 H PRF BACK SPASMS     metoprolol succinate (TOPROL-XL) 100 MG 24 hr tablet Take 0.5 tablets (50 mg total) by mouth every morning AND 1 tablet (100 mg total) every evening. Take with or immediately following a meal.. 135 tablet 3   Semaglutide-Weight Management (WEGOVY) 0.25 MG/0.5ML SOAJ Inject 0.25 mg into the skin once a week. FOR 4 WEEKS 2 mL 1   SYSTANE ULTRA 0.4-0.3 % SOLN      tamsulosin (FLOMAX) 0.4 MG CAPS capsule Take 1 capsule (0.4 mg total) by mouth daily. 30 capsule 3   No current facility-administered medications for this visit.    There were no vitals taken for this visit. General: NAD Neck: No JVD, no thyromegaly or thyroid nodule.  Lungs: Clear to auscultation bilaterally with normal respiratory effort. CV: Nondisplaced PMI.  Heart regular S1/S2, no S3/S4, no murmur.  1+ right ankle edema (chronic asymmetry).  No carotid bruit.  Normal pedal pulses.  Abdomen: Soft, nontender, no hepatosplenomegaly, no distention.  Skin: Intact without lesions or rashes.  Neurologic: Alert and oriented x 3.  Psych: Normal affect. Extremities: No clubbing or cyanosis.  HEENT: Normal.   Assessment/Plan: 1. CAD: Inferior STEMI in 11/23 treated with DES to 99% stenosis proximal RCA, nonobstructive disease in LAD and LCx.  Based on history, suspect he may have had some RV involvement (hypotension initially requiring IV fluid then hypoxemic and treated with Lasix). Echo at [facility] in 11/23 showed EF > 70% with no WMAs, but RV not well-visualized.  Echo here  in 12/23 showed EF 65-70%, normal RV.  No exertional symptoms.   - Continue Plavix 75 mg daily - continue ASA 81 (he was on ASA 81 when he had his MI, so long-term regimen will be ASA 81 + Plavix).  - Continue atorvastatin 80 daily - Continue exercise.   2. Hyperlipidemia: Goal LDL < 55. Great LDL in 10/24 (32).    3. HTN: BP is well-controlled, SBP 110s-120s.  - Continue Toprol XL - Continue losartan 25 daily - Continue HCTZ 25 mg daily. BMET today.   4. CVA: H/o CVA/TIA in the remote past. No recent neurological symptoms.   Followup 6 months.   [place] Maddy Graham, NP 12/14/2023

## 2023-12-15 ENCOUNTER — Encounter (HOSPITAL_COMMUNITY): Payer: Self-pay

## 2023-12-15 ENCOUNTER — Ambulatory Visit (HOSPITAL_COMMUNITY)
Admit: 2023-12-15 | Discharge: 2023-12-15 | Disposition: A | Discharge status: Home / Self Care | Payer: Medicare PPO | Attending: Cardiology | Admitting: Cardiology

## 2023-12-15 VITALS — BP 84/62 | HR 90 | Ht 72.0 in | Wt 200.0 lb

## 2023-12-15 DIAGNOSIS — N401 Enlarged prostate with lower urinary tract symptoms: Secondary | ICD-10-CM | POA: Diagnosis not present

## 2023-12-15 DIAGNOSIS — I4729 Other ventricular tachycardia: Secondary | ICD-10-CM

## 2023-12-15 DIAGNOSIS — E785 Hyperlipidemia, unspecified: Secondary | ICD-10-CM | POA: Diagnosis not present

## 2023-12-15 DIAGNOSIS — R3912 Poor urinary stream: Secondary | ICD-10-CM | POA: Diagnosis not present

## 2023-12-15 DIAGNOSIS — Z7902 Long term (current) use of antithrombotics/antiplatelets: Secondary | ICD-10-CM | POA: Diagnosis not present

## 2023-12-15 DIAGNOSIS — Z79899 Other long term (current) drug therapy: Secondary | ICD-10-CM | POA: Diagnosis not present

## 2023-12-15 DIAGNOSIS — Z8673 Personal history of transient ischemic attack (TIA), and cerebral infarction without residual deficits: Secondary | ICD-10-CM | POA: Diagnosis not present

## 2023-12-15 DIAGNOSIS — Z85038 Personal history of other malignant neoplasm of large intestine: Secondary | ICD-10-CM | POA: Diagnosis not present

## 2023-12-15 DIAGNOSIS — I252 Old myocardial infarction: Secondary | ICD-10-CM | POA: Diagnosis not present

## 2023-12-15 DIAGNOSIS — I959 Hypotension, unspecified: Secondary | ICD-10-CM

## 2023-12-15 DIAGNOSIS — Z7982 Long term (current) use of aspirin: Secondary | ICD-10-CM | POA: Insufficient documentation

## 2023-12-15 DIAGNOSIS — I2510 Atherosclerotic heart disease of native coronary artery without angina pectoris: Secondary | ICD-10-CM | POA: Diagnosis not present

## 2023-12-15 DIAGNOSIS — I10 Essential (primary) hypertension: Secondary | ICD-10-CM | POA: Diagnosis not present

## 2023-12-15 LAB — BASIC METABOLIC PANEL
Anion gap: 11 (ref 5–15)
BUN: 14 mg/dL (ref 8–23)
CO2: 31 mmol/L (ref 22–32)
Calcium: 8.8 mg/dL — ABNORMAL LOW (ref 8.9–10.3)
Chloride: 98 mmol/L (ref 98–111)
Creatinine, Ser: 1.11 mg/dL (ref 0.61–1.24)
GFR, Estimated: >60 mL/min (ref >60)
Glucose, Bld: 102 mg/dL — ABNORMAL HIGH (ref 70–99)
Potassium: 3.6 mmol/L (ref 3.5–5.1)
Sodium: 140 mmol/L (ref 135–145)

## 2023-12-15 LAB — MAGNESIUM: Magnesium: 2.2 mg/dL (ref 1.7–2.4)

## 2023-12-15 NOTE — Patient Instructions (Signed)
 Stop Losartan. Labs today - will call you if abnormal. Return to Heart Failure Clinic in 4 weeks for nurse visit with blood pressure check. Return to see Dr. Shirlee Latch in 6 months. Please call us in August to schedule this appointment. Please call us at [phone] if any questions or concerns prior to your next visit.

## 2023-12-23 DIAGNOSIS — R195 Other fecal abnormalities: Secondary | ICD-10-CM | POA: Diagnosis not present

## 2023-12-23 DIAGNOSIS — A020 Salmonella enteritis: Secondary | ICD-10-CM | POA: Diagnosis not present

## 2023-12-23 DIAGNOSIS — I10 Essential (primary) hypertension: Secondary | ICD-10-CM | POA: Diagnosis not present

## 2023-12-23 DIAGNOSIS — R7303 Prediabetes: Secondary | ICD-10-CM | POA: Diagnosis not present

## 2023-12-30 ENCOUNTER — Ambulatory Visit: Admitting: Podiatry

## 2023-12-30 ENCOUNTER — Encounter: Payer: Self-pay | Admitting: Podiatry

## 2023-12-30 DIAGNOSIS — M5416 Radiculopathy, lumbar region: Secondary | ICD-10-CM

## 2023-12-30 NOTE — Progress Notes (Signed)
   Chief Complaint  Patient presents with   Foot Pain    "I'm having tingling, particularly my left foot, at night.  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  I've been taking several antibiotics." N - tingling L - left D - 2-3 days O - suddenly, gotten better C - tingling A - none T- compression hose up to my knee    HPI: 78 y.o. male presenting today for idiopathic onset of numbness with tingling sensation to the left foot.  Onset about 2-3 days ago.  He says he woke up in the morning with the numbness in the toes to the left foot.  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  He says there has been improvement over the last few days as well  Past Medical History:  Diagnosis Date   Colon cancer [facility])    s/p R hemicolectomy 06/2008   Coronary artery disease    Non-Obstructive;  LHC 2000 no sig dz; ETT-Cardiolite 2007 with fixed Inf defect;  LHC 4/07:  EF 60%, CFX 30-40%, mRCA 30-40% (prob false + CLite)   CVA (cerebral infarction)    cerebellar CVA 2000: doc by MRI; Poss TIA 7/10 with vertigo; MRI 7/10 => only small vessel disease; Carotids 9/10: no sig disease;  TEE 9/10: normal LV and RV size and systolic fxn, no PFO, no sig valvular dz, at least Gr III plaque in arch and desc thoracic aorta (no ulceration or mobility)   Diverticulosis    H/O echocardiogram    Echo 9/13: EF 55-60%, mod LVH, grade 1 diast dysfn, mild to mod LAE   Hyperlipidemia    Hypertension    Psoriasis     Past Surgical History:  Procedure Laterality Date   KNEE SURGERY Left 07/2014    No Known Allergies   Physical Exam: General: The patient is alert and oriented x3 in no acute distress.  Dermatology: Skin is warm, dry and supple bilateral lower extremities.   Vascular: Palpable pedal pulses bilaterally. Capillary refill within normal limits.  No appreciable edema.  No erythema.  Neurological: Grossly intact via light touch  Musculoskeletal Exam: No pedal  deformities noted.  Muscle strength 5/5 all compartments.  No tenderness with palpation   Assessment/Plan of Care: 1.  Acute exacerbation of possible lumbar radiculopathy affecting the left lower extremity  -Patient evaluated -Over the past 2-3 days there has been some improvement.  Continue monitoring for now -Patient has a muscle relaxer at home.  He will take as needed. -Return to clinic as needed       Felecia Shelling, DPM [facility]  Dr. Felecia Shelling, DPM    [address] [place], [place] [address]                Office [phone]  Fax [phone]

## 2024-01-06 ENCOUNTER — Encounter (HOSPITAL_COMMUNITY): Payer: Self-pay | Admitting: Cardiology

## 2024-01-06 DIAGNOSIS — I5022 Chronic systolic (congestive) heart failure: Secondary | ICD-10-CM

## 2024-01-07 DIAGNOSIS — M1712 Unilateral primary osteoarthritis, left knee: Secondary | ICD-10-CM | POA: Diagnosis not present

## 2024-01-08 ENCOUNTER — Ambulatory Visit (HOSPITAL_COMMUNITY)
Admission: RE | Admit: 2024-01-08 | Discharge: 2024-01-08 | Disposition: A | Discharge status: Home / Self Care | Source: Ambulatory Visit | Attending: Internal Medicine | Admitting: Internal Medicine

## 2024-01-08 DIAGNOSIS — I5022 Chronic systolic (congestive) heart failure: Secondary | ICD-10-CM | POA: Diagnosis not present

## 2024-01-08 LAB — BASIC METABOLIC PANEL WITH GFR
Anion gap: 11 (ref 5–15)
BUN: 28 mg/dL — ABNORMAL HIGH (ref 8–23)
CO2: 28 mmol/L (ref 22–32)
Calcium: 9.3 mg/dL (ref 8.9–10.3)
Chloride: 102 mmol/L (ref 98–111)
Creatinine, Ser: 1.01 mg/dL (ref 0.61–1.24)
GFR, Estimated: >60 mL/min (ref >60)
Glucose, Bld: 170 mg/dL — ABNORMAL HIGH (ref 70–99)
Potassium: 3.9 mmol/L (ref 3.5–5.1)
Sodium: 141 mmol/L (ref 135–145)

## 2024-01-12 ENCOUNTER — Ambulatory Visit (HOSPITAL_COMMUNITY)
Admission: RE | Admit: 2024-01-12 | Discharge: 2024-01-12 | Disposition: A | Discharge status: Home / Self Care | Source: Ambulatory Visit | Attending: Internal Medicine | Admitting: Internal Medicine

## 2024-01-12 DIAGNOSIS — Z0131 Encounter for examination of blood pressure with abnormal findings: Secondary | ICD-10-CM | POA: Diagnosis not present

## 2024-01-12 MED ORDER — LOSARTAN POTASSIUM 25 MG PO TABS: 12.5000 mg | ORAL_TABLET | Freq: Every day | ORAL | 3 refills | Status: AC

## 2024-01-12 NOTE — Patient Instructions (Signed)
 Good to see you today!  Restart losartan 12.5 mg ( 1/2 tablet) daily  Check b/p daily and notify clinic if systolic b/p less than 90  If you have any questions or concerns before your next appointment please send [place] a message through [place] or call our office at [phone].    TO LEAVE A MESSAGE FOR THE NURSE SELECT OPTION 2, PLEASE LEAVE A MESSAGE INCLUDING: YOUR NAME DATE OF BIRTH CALL BACK NUMBER REASON FOR CALL**this is important as we prioritize the call backs  YOU WILL RECEIVE A CALL BACK THE SAME DAY AS LONG AS YOU CALL BEFORE 4:00 PM At the [facility], you and your health needs are our priority. As part of our continuing mission to provide you with exceptional heart care, we have created designated Provider Care Teams. These Care Teams include your primary Cardiologist (physician) and Advanced Practice Providers (APPs- Physician Assistants and Nurse Practitioners) who all work together to provide you with the care you need, when you need it.   You may see any of the following providers on your designated Care Team at your next follow up: Dr Arvilla Meres Dr Marca Ancona Dr. Dorthula Nettles Dr. Clearnce Hasten Amy Filbert Schilder, NP Robbie Lis, [place] [facility] [place], [place] Brynda Peon, NP [place] Lee, NP Clarisa Kindred, NP Karle Plumber, PharmD Enos Fling, PharmD   Please be sure to bring in all your medications bottles to every appointment.    Thank you for choosing [facility]

## 2024-01-14 DIAGNOSIS — N401 Enlarged prostate with lower urinary tract symptoms: Secondary | ICD-10-CM | POA: Diagnosis not present

## 2024-01-14 DIAGNOSIS — R3912 Poor urinary stream: Secondary | ICD-10-CM | POA: Diagnosis not present

## 2024-01-21 ENCOUNTER — Ambulatory Visit: Payer: Medicare PPO

## 2024-01-22 ENCOUNTER — Ambulatory Visit: Payer: Medicare PPO

## 2024-01-27 DIAGNOSIS — J432 Centrilobular emphysema: Secondary | ICD-10-CM | POA: Diagnosis not present

## 2024-01-27 DIAGNOSIS — I25119 Atherosclerotic heart disease of native coronary artery with unspecified angina pectoris: Secondary | ICD-10-CM | POA: Diagnosis not present

## 2024-01-27 DIAGNOSIS — Z0000 Encounter for general adult medical examination without abnormal findings: Secondary | ICD-10-CM | POA: Diagnosis not present

## 2024-01-27 DIAGNOSIS — R7303 Prediabetes: Secondary | ICD-10-CM | POA: Diagnosis not present

## 2024-01-27 DIAGNOSIS — E7800 Pure hypercholesterolemia, unspecified: Secondary | ICD-10-CM | POA: Diagnosis not present

## 2024-01-27 DIAGNOSIS — I10 Essential (primary) hypertension: Secondary | ICD-10-CM | POA: Diagnosis not present

## 2024-01-27 DIAGNOSIS — R609 Edema, unspecified: Secondary | ICD-10-CM | POA: Diagnosis not present

## 2024-02-02 ENCOUNTER — Encounter (HOSPITAL_COMMUNITY): Payer: Self-pay | Admitting: Cardiology

## 2024-02-02 ENCOUNTER — Ambulatory Visit (HOSPITAL_COMMUNITY)
Admission: RE | Admit: 2024-02-02 | Discharge: 2024-02-02 | Disposition: A | Discharge status: Home / Self Care | Source: Ambulatory Visit | Attending: Cardiology | Admitting: Cardiology

## 2024-02-02 VITALS — BP 124/70 | HR 71 | Wt 207.0 lb

## 2024-02-02 DIAGNOSIS — Z8673 Personal history of transient ischemic attack (TIA), and cerebral infarction without residual deficits: Secondary | ICD-10-CM | POA: Diagnosis not present

## 2024-02-02 DIAGNOSIS — I2510 Atherosclerotic heart disease of native coronary artery without angina pectoris: Secondary | ICD-10-CM | POA: Insufficient documentation

## 2024-02-02 DIAGNOSIS — Z79899 Other long term (current) drug therapy: Secondary | ICD-10-CM | POA: Insufficient documentation

## 2024-02-02 DIAGNOSIS — Z7982 Long term (current) use of aspirin: Secondary | ICD-10-CM | POA: Diagnosis not present

## 2024-02-02 DIAGNOSIS — Z955 Presence of coronary angioplasty implant and graft: Secondary | ICD-10-CM | POA: Diagnosis not present

## 2024-02-02 DIAGNOSIS — Z7902 Long term (current) use of antithrombotics/antiplatelets: Secondary | ICD-10-CM | POA: Insufficient documentation

## 2024-02-02 DIAGNOSIS — I252 Old myocardial infarction: Secondary | ICD-10-CM | POA: Insufficient documentation

## 2024-02-02 DIAGNOSIS — I10 Essential (primary) hypertension: Secondary | ICD-10-CM | POA: Diagnosis not present

## 2024-02-02 DIAGNOSIS — E785 Hyperlipidemia, unspecified: Secondary | ICD-10-CM | POA: Diagnosis not present

## 2024-02-02 DIAGNOSIS — R197 Diarrhea, unspecified: Secondary | ICD-10-CM | POA: Insufficient documentation

## 2024-02-02 DIAGNOSIS — E878 Other disorders of electrolyte and fluid balance, not elsewhere classified: Secondary | ICD-10-CM | POA: Diagnosis not present

## 2024-02-02 DIAGNOSIS — I5022 Chronic systolic (congestive) heart failure: Secondary | ICD-10-CM | POA: Diagnosis not present

## 2024-02-02 NOTE — Patient Instructions (Signed)
 There has been no changes to your medications.  Blood work in 2 months.  Your physician recommends that you schedule a follow-up appointment in: 6 months ( October) ** PLEASE CALL THE OFFICE IN JULY TO ARRANGE YOUR FOLLOW UP APPOINTMENT.**  If you have any questions or concerns before your next appointment please send us  a message through [place] or call our office at [phone].    TO LEAVE A MESSAGE FOR THE NURSE SELECT OPTION 2, PLEASE LEAVE A MESSAGE INCLUDING: YOUR NAME DATE OF BIRTH CALL BACK NUMBER REASON FOR CALL**this is important as we prioritize the call backs  YOU WILL RECEIVE A CALL BACK THE SAME DAY AS LONG AS YOU CALL BEFORE 4:00 PM  At the [facility], you and your health needs are our priority. As part of our continuing mission to provide you with exceptional heart care, we have created designated Provider Care Teams. These Care Teams include your primary Cardiologist (physician) and Advanced Practice Providers (APPs- Physician Assistants and Nurse Practitioners) who all work together to provide you with the care you need, when you need it.   You may see any of the following providers on your designated Care Team at your next follow up: Dr Jules Oar Dr Peder Bourdon Dr. Alwin Baars Dr. Arta Lark Amy Marijane Shoulders, NP Ruddy Corral, [place] [facility] [place], [place] Dennise Fitz, NP [place] Lee, NP Shawnee Dellen, NP Luster Salters, PharmD Bevely Brush, PharmD   Please be sure to bring in all your medications bottles to every appointment.    Thank you for choosing [facility]

## 2024-02-02 NOTE — Progress Notes (Signed)
 Patient ID: Jose Meyer, male   DOB: [birth year], 78 y.o.   MRN: [id number]     ADVANCED HF CLINIC NOTE  PCP: Dr. Senaida Dama Cardiology: Dr. Mitzie Anda  Chief Complaint: CAD  78 y.o. with past medical history of nonobstructive CAD, HTN, hyperlipidemia, HTN, CVA, and colon cancer s/p R hemicolectomy in 2009.    TEE in 9/10 showing no PFO, normal LV and RV systolic function, grade III plaque in the aortic arch/descending thoracic aorta. Patient has known nonobstructive CAD from cath in 2007.  Echo in 9/13 showed normal EF.    Patient had an inferior STEMI in 11/23 while on vacation in [place], [place].  He was taken to [facility] where inferior STEMI was noted.  He had cath with 99% stenosis proximal RCA treated with DES. Echo showed EF >70% with no WMAs, poor RV visualization.   Echo in 12/23 showed EF 65-70%, no wall motion abnormalities, normal RV.    Admitted 11/27/23 for Salmonella gastroenteritis, during admission developed VT, this was suspected to be due to electrolyte imbalance with profound diarrhea (low K, low Mg). Echo showed 65-70%, G1DD, normal RV, aortic valve sclerosis.  He returns for followup of CAD.  Weight up about 7 lbs.  No palpitations or lightheadedness.  No chest pain.  No stroke-like symptoms.  No chest pain.  No exertional dyspnea.    ECG (personally reviewed): NSR, normal  Labs (10/24): LDL 32 Labs (3/25): K 3.9, creatinine 1.01 Labs (4/25): LDL 32, K 4, creatinine 0.82, hgb 14, AST/ALT normal, tbili 1.2  Past Medical History: 1. Hyperlipidemia 2. Cerebellar CVA 2000: documented by MRI.  Possible TIA 7/10 with vertigo.  MRI 7/10 showed only small vessel disease.  Possible TIA 7/10.  Carotid dopplers (9/10) with no significant disease.  TEE (9/10): Normal LV and RV size and systolic function.  No PFO.  No significant valvular disease.  At least grade III plaque in the arch and descending thoracic aorta with no ulceration or mobility.  3. HTN 4. History of colon CA s/p  R hemicolectomy in 9/09.  5. psoriasis 6. diverticulosis 7. CAD: nonobstructive.  LHC in 2000 without significant disease.  ETT-cardiolite 2007 with fixed inferior defect.  LHC (4/07) showed EF 60%, 30-40% CFX stenosis, 30-40% mRCA stenosis.  Probably false positive cardiolite.  Echo (9/13): EF 55-60%, mild LVH, grade I diastolic dysfunction, mild to moderate LAE. - Cardiolite (2/18): Medium-sized, fixed inferolateral perfusion defect (likely artifact given normal wall motion), no ischemia, EF 64%.  - Inferior STEMI (11/23) with 99% proximal RCA treated with DES. Echo in 11/23 with EF > 70%, no WMAs, RV poorly visualized.  - Echo (2/25): EF 65-70%, normal RV, aortic valve sclerosis.  8. Palpitations: PVCs.  30 day event monitor in 9/13 with occasional PVCs but no more significant arrhythmias.   9. Chronic low back pain.  10. Plantar fasciitis 11. Psoriasis 12. Basal cell carcinoma 13. Salmonella gastroenteritis 2/25  Family History: No premature CAD.   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  No smoking since 1975.  Occasional ETOH.  Married with 2 children [facility], cardiologist in [place]).  Enjoys woodworking.  ROS: All systems reviewed and negative except as per HPI.   Current Outpatient Medications  Medication Sig Dispense Refill   aspirin  81 MG chewable tablet Chew 81 mg by mouth daily.     atorvastatin  (LIPITOR) 80 MG tablet Take 1 tablet (80 mg total) by mouth daily. 90  tablet 3   betamethasone  dipropionate 0.05 % lotion Apply 0.05 application topically as needed. For the scalp after shower  0   clopidogrel  (PLAVIX ) 75 MG tablet Take 1 tablet (75 mg total) by mouth daily. 90 tablet 3   ELIDEL 1 % cream Apply 1 application topically as needed. For skin  0   fluticasone (FLONASE) 50 MCG/ACT nasal spray Place 1 spray into both nostrils as needed for allergies or rhinitis.     furosemide  (LASIX ) 20  MG tablet Take 1 tablet (20 mg total) by mouth daily as needed. 30 tablet 11   hydrochlorothiazide  (HYDRODIURIL ) 25 MG tablet Take 1 tablet (25 mg total) by mouth daily. 1 tablet in the morning 90 tablet 3   losartan  (COZAAR ) 25 MG tablet Take 0.5 tablets (12.5 mg total) by mouth daily. 90 tablet 3   methocarbamol (ROBAXIN) 500 MG tablet TK 1 T PO Q 6 H PRF BACK SPASMS     metoprolol  succinate (TOPROL -XL) 100 MG 24 hr tablet Take 0.5 tablets (50 mg total) by mouth every morning AND 1 tablet (100 mg total) every evening. Take with or immediately following a meal.. 135 tablet 3   silodosin (RAPAFLO) 8 MG CAPS capsule Take 8 mg by mouth daily with breakfast.     SYSTANE ULTRA 0.4-0.3 % SOLN      No current facility-administered medications for this encounter.    BP 124/70   Pulse 71   Wt 93.9 kg (207 lb)   SpO2 96%   BMI 28.07 kg/m   Filed Weights   02/02/24 1006  Weight: 93.9 kg (207 lb)    General: NAD Neck: No JVD, no thyromegaly or thyroid nodule.  Lungs: Clear to auscultation bilaterally with normal respiratory effort. CV: Nondisplaced PMI.  Heart regular S1/S2, no S3/S4, 1/6 SEM RUSB.  Trace right ankle edema.  No carotid bruit.  Normal pedal pulses.  Abdomen: Soft, nontender, no hepatosplenomegaly, no distention.  Skin: Intact without lesions or rashes.  Neurologic: Alert and oriented x 3.  Psych: Normal affect. Extremities: No clubbing or cyanosis.  HEENT: Normal.   Assessment/Plan: 1. CAD: Inferior STEMI in 11/23 treated with DES to 99% stenosis proximal RCA, nonobstructive disease in LAD and LCx.  Based on history, suspect he may have had some RV involvement (hypotension initially requiring IV fluid then hypoxemic and treated with Lasix ). Echo at [facility] in 11/23 showed EF > 70% with no WMAs, but RV not well-visualized.  Echo in 2/25  showed EF 65-70%, normal RV.  No exertional chest pain or dyspnea.  - Continue Plavix  75 mg daily - Continue ASA 81 (he was on ASA 81 when he  had his MI, so long-term regimen will be ASA 81 + Plavix ).  - Continue atorvastatin  80 daily - Hold off for now on SGLT2 inhibitor given bacteruria and BPH, follows with urology.  2. Hyperlipidemia: Goal LDL < 55. Great LDL in 4/25 (32).   3. HTN: BP controlled.  - Continue Toprol  XL 50 qam/100 qpm - Continue losartan  12.5 daily.  - Continue hydrochlorothiazide  25 mg daily.  BMET recently was stable.  4. CVA: H/o CVA/TIA in the remote past. No recent neurological symptoms. 5. VT: Asymptomatic and self-limited. Noted in setting of electrolyte derangement with profuse Salmonella diarrhea.  No palpitations or lightheadedness.   Followup 6 months with Dr. Mitzie Anda  I spent 21 minutes reviewing records, interviewing/examining patient, and managing orders.    Peder Bourdon,  02/02/2024

## 2024-02-04 DIAGNOSIS — D2272 Melanocytic nevi of left lower limb, including hip: Secondary | ICD-10-CM | POA: Diagnosis not present

## 2024-02-04 DIAGNOSIS — L578 Other skin changes due to chronic exposure to nonionizing radiation: Secondary | ICD-10-CM | POA: Diagnosis not present

## 2024-02-04 DIAGNOSIS — C44712 Basal cell carcinoma of skin of right lower limb, including hip: Secondary | ICD-10-CM | POA: Diagnosis not present

## 2024-02-04 DIAGNOSIS — Z85828 Personal history of other malignant neoplasm of skin: Secondary | ICD-10-CM | POA: Diagnosis not present

## 2024-02-04 DIAGNOSIS — L821 Other seborrheic keratosis: Secondary | ICD-10-CM | POA: Diagnosis not present

## 2024-02-04 DIAGNOSIS — D2271 Melanocytic nevi of right lower limb, including hip: Secondary | ICD-10-CM | POA: Diagnosis not present

## 2024-02-04 DIAGNOSIS — D0439 Carcinoma in situ of skin of other parts of face: Secondary | ICD-10-CM | POA: Diagnosis not present

## 2024-02-04 DIAGNOSIS — L219 Seborrheic dermatitis, unspecified: Secondary | ICD-10-CM | POA: Diagnosis not present

## 2024-02-04 DIAGNOSIS — D225 Melanocytic nevi of trunk: Secondary | ICD-10-CM | POA: Diagnosis not present

## 2024-02-04 DIAGNOSIS — D485 Neoplasm of uncertain behavior of skin: Secondary | ICD-10-CM | POA: Diagnosis not present

## 2024-02-04 DIAGNOSIS — C44719 Basal cell carcinoma of skin of left lower limb, including hip: Secondary | ICD-10-CM | POA: Diagnosis not present

## 2024-02-04 DIAGNOSIS — Z86018 Personal history of other benign neoplasm: Secondary | ICD-10-CM | POA: Diagnosis not present

## 2024-02-27 ENCOUNTER — Ambulatory Visit: Payer: Medicare PPO | Admitting: Podiatry

## 2024-03-02 DIAGNOSIS — L570 Actinic keratosis: Secondary | ICD-10-CM | POA: Diagnosis not present

## 2024-03-02 DIAGNOSIS — C44329 Squamous cell carcinoma of skin of other parts of face: Secondary | ICD-10-CM | POA: Diagnosis not present

## 2024-03-03 DIAGNOSIS — M1712 Unilateral primary osteoarthritis, left knee: Secondary | ICD-10-CM | POA: Diagnosis not present

## 2024-03-09 DIAGNOSIS — Z4802 Encounter for removal of sutures: Secondary | ICD-10-CM | POA: Diagnosis not present

## 2024-03-09 DIAGNOSIS — D485 Neoplasm of uncertain behavior of skin: Secondary | ICD-10-CM | POA: Diagnosis not present

## 2024-03-10 DIAGNOSIS — M1712 Unilateral primary osteoarthritis, left knee: Secondary | ICD-10-CM | POA: Diagnosis not present

## 2024-03-15 ENCOUNTER — Encounter: Payer: Self-pay | Admitting: Podiatry

## 2024-03-15 ENCOUNTER — Ambulatory Visit: Payer: Medicare PPO | Admitting: Podiatry

## 2024-03-15 VITALS — Ht 72.0 in | Wt 207.0 lb

## 2024-03-15 DIAGNOSIS — M79675 Pain in left toe(s): Secondary | ICD-10-CM

## 2024-03-15 DIAGNOSIS — D689 Coagulation defect, unspecified: Secondary | ICD-10-CM | POA: Diagnosis not present

## 2024-03-15 DIAGNOSIS — M79674 Pain in right toe(s): Secondary | ICD-10-CM | POA: Diagnosis not present

## 2024-03-15 DIAGNOSIS — B351 Tinea unguium: Secondary | ICD-10-CM

## 2024-03-16 ENCOUNTER — Other Ambulatory Visit (HOSPITAL_COMMUNITY)

## 2024-03-16 DIAGNOSIS — L918 Other hypertrophic disorders of the skin: Secondary | ICD-10-CM | POA: Diagnosis not present

## 2024-03-16 DIAGNOSIS — D485 Neoplasm of uncertain behavior of skin: Secondary | ICD-10-CM | POA: Diagnosis not present

## 2024-03-17 ENCOUNTER — Ambulatory Visit (HOSPITAL_COMMUNITY): Payer: Self-pay | Admitting: Cardiology

## 2024-03-17 ENCOUNTER — Ambulatory Visit (HOSPITAL_COMMUNITY)
Admission: RE | Admit: 2024-03-17 | Discharge: 2024-03-17 | Disposition: A | Discharge status: Home / Self Care | Source: Ambulatory Visit | Attending: Cardiology | Admitting: Cardiology

## 2024-03-17 DIAGNOSIS — M1712 Unilateral primary osteoarthritis, left knee: Secondary | ICD-10-CM | POA: Diagnosis not present

## 2024-03-17 DIAGNOSIS — I5022 Chronic systolic (congestive) heart failure: Secondary | ICD-10-CM | POA: Diagnosis not present

## 2024-03-17 LAB — COMPREHENSIVE METABOLIC PANEL WITH GFR
ALT: 23 U/L (ref 0–44)
AST: 23 U/L (ref 15–41)
Albumin: 3.4 g/dL — ABNORMAL LOW (ref 3.5–5.0)
Alkaline Phosphatase: 47 U/L (ref 38–126)
Anion gap: 10 (ref 5–15)
BUN: 23 mg/dL (ref 8–23)
CO2: 29 mmol/L (ref 22–32)
Calcium: 9.3 mg/dL (ref 8.9–10.3)
Chloride: 102 mmol/L (ref 98–111)
Creatinine, Ser: 0.94 mg/dL (ref 0.61–1.24)
GFR, Estimated: >60 mL/min (ref >60)
Glucose, Bld: 105 mg/dL — ABNORMAL HIGH (ref 70–99)
Potassium: 4 mmol/L (ref 3.5–5.1)
Sodium: 141 mmol/L (ref 135–145)
Total Bilirubin: 1.1 mg/dL (ref 0.0–1.2)
Total Protein: 6.1 g/dL — ABNORMAL LOW (ref 6.5–8.1)

## 2024-03-18 NOTE — Progress Notes (Signed)
  Subjective:  Patient ID: Jose Meyer, male    DOB: [birth year],  MRN: [id number]  78 y.o. male presents at risk foot care with h/o clotting disorder and painful thick toenails that are difficult to trim. Pain interferes with ambulation. Aggravating factors include wearing enclosed shoe gear. Pain is relieved with periodic professional debridement.  Chief Complaint  Patient presents with   Nail Problem    Pt is here for [facility] PCP is Dr Maryrose Soja and LOV was in March.   New problem(s): None   PCP is Ronna Coho, MD.  No Known Allergies  Review of Systems: Negative except as noted in the HPI.   Objective:  Jose Meyer is a pleasant 78 y.o. male in NAD. AAO x 3.  Vascular Examination: Vascular status intact b/l with palpable pedal pulses. CFT immediate b/l. Pedal hair present. No edema. No pain with calf compression b/l. Skin temperature gradient WNL b/l. No varicosities noted. No cyanosis or clubbing noted.  Neurological Examination: Sensation grossly intact b/l with 10 gram monofilament. Vibratory sensation intact b/l.  Dermatological Examination: Pedal skin with normal turgor, texture and tone b/l. No open wounds nor interdigital macerations noted. Toenails 1-5 b/l thick, discolored, elongated with subungual debris and pain on dorsal palpation. No hyperkeratotic lesions noted b/l.   Musculoskeletal Examination: Muscle strength 5/5 to b/l LE.  No pain, crepitus noted b/l. No gross pedal deformities. Patient ambulates independently without assistive aids.   Radiographs: None  Last A1c:       No data to display          Assessment:   1. Pain due to onychomycosis of toenails of both feet   2. Clotting disorder [facility])    Plan:  Consent given for treatment. Patient examined. All patient's and/or POA's questions/concerns addressed on today's visit.Toenails 1-5 debrided in length and girth without incident. Continue soft, supportive shoe gear daily. Report any pedal  injuries to medical professional. Call office if there are any questions/concerns. -Patient/POA to call should there be question/concern in the interim.  Return in about 3 months (around 06/15/2024).  Jose Meyer, DPM      [place] LOCATION: [address] [place] [address]                   Office [phone]   [facility] LOCATION: [address] [place], [place] [address] Office [phone]

## 2024-04-19 DIAGNOSIS — N401 Enlarged prostate with lower urinary tract symptoms: Secondary | ICD-10-CM | POA: Diagnosis not present

## 2024-04-19 DIAGNOSIS — R3912 Poor urinary stream: Secondary | ICD-10-CM | POA: Diagnosis not present

## 2024-04-30 DIAGNOSIS — C44712 Basal cell carcinoma of skin of right lower limb, including hip: Secondary | ICD-10-CM | POA: Diagnosis not present

## 2024-04-30 DIAGNOSIS — C44719 Basal cell carcinoma of skin of left lower limb, including hip: Secondary | ICD-10-CM | POA: Diagnosis not present

## 2024-05-06 DIAGNOSIS — D485 Neoplasm of uncertain behavior of skin: Secondary | ICD-10-CM | POA: Diagnosis not present

## 2024-05-06 DIAGNOSIS — L570 Actinic keratosis: Secondary | ICD-10-CM | POA: Diagnosis not present

## 2024-05-06 DIAGNOSIS — D044 Carcinoma in situ of skin of scalp and neck: Secondary | ICD-10-CM | POA: Diagnosis not present

## 2024-05-12 ENCOUNTER — Encounter: Payer: Self-pay | Admitting: Pharmacist Clinician (PhC)/ Clinical Pharmacy Specialist

## 2024-05-12 ENCOUNTER — Encounter (HOSPITAL_COMMUNITY): Payer: Self-pay | Admitting: Cardiology

## 2024-05-12 MED ORDER — WEGOVY 0.25 MG/0.5ML ~~LOC~~ SOAJ: 0.2500 mg | SUBCUTANEOUS | 0 refills | Status: DC

## 2024-05-12 NOTE — Telephone Encounter (Signed)
 Wegovy  restarted by [facility] PharmD Any objections?

## 2024-05-27 DIAGNOSIS — C4442 Squamous cell carcinoma of skin of scalp and neck: Secondary | ICD-10-CM | POA: Diagnosis not present

## 2024-05-28 ENCOUNTER — Ambulatory Visit: Payer: Medicare PPO | Admitting: Podiatry

## 2024-06-17 DIAGNOSIS — L570 Actinic keratosis: Secondary | ICD-10-CM | POA: Diagnosis not present

## 2024-06-17 DIAGNOSIS — D485 Neoplasm of uncertain behavior of skin: Secondary | ICD-10-CM | POA: Diagnosis not present

## 2024-06-17 DIAGNOSIS — C44719 Basal cell carcinoma of skin of left lower limb, including hip: Secondary | ICD-10-CM | POA: Diagnosis not present

## 2024-06-20 ENCOUNTER — Encounter (HOSPITAL_BASED_OUTPATIENT_CLINIC_OR_DEPARTMENT_OTHER): Payer: Self-pay | Admitting: Pharmacist Clinician (PhC)/ Clinical Pharmacy Specialist

## 2024-06-21 ENCOUNTER — Ambulatory Visit: Payer: Medicare PPO | Admitting: Podiatry

## 2024-06-21 DIAGNOSIS — D689 Coagulation defect, unspecified: Secondary | ICD-10-CM | POA: Diagnosis not present

## 2024-06-21 DIAGNOSIS — B351 Tinea unguium: Secondary | ICD-10-CM

## 2024-06-21 DIAGNOSIS — M79675 Pain in left toe(s): Secondary | ICD-10-CM | POA: Diagnosis not present

## 2024-06-21 DIAGNOSIS — M79674 Pain in right toe(s): Secondary | ICD-10-CM | POA: Diagnosis not present

## 2024-06-22 NOTE — Progress Notes (Unsigned)
  Subjective:  Patient ID: Jose Meyer, male    DOB: 04-13-1946,  MRN: [id number]  Jose Meyer presents to clinic today for {jgcomplaint:23593}  Chief Complaint  Patient presents with   Nail Problem    Thick painful toenails, 3 month follow up   New problem(s): None. {jgcomplaint:23593}  PCP is Kip Righter, MD.  No Known Allergies  Review of Systems: Negative except as noted in the HPI.  Objective: No changes noted in today's physical examination. There were no vitals filed for this visit. Jose Meyer is a pleasant 78 y.o. male in NAD. AAO x 3.  Vascular Examination: Vascular status intact b/l with palpable pedal pulses. CFT immediate b/l. Pedal hair present. No edema. No pain with calf compression b/l. Skin temperature gradient WNL b/l. No varicosities noted. No cyanosis or clubbing noted.  Neurological Examination: Sensation grossly intact b/l with 10 gram monofilament. Vibratory sensation intact b/l.  Dermatological Examination: Pedal skin with normal turgor, texture and tone b/l. No open wounds nor interdigital macerations noted. Toenails 1-5 b/l thick, discolored, elongated with subungual debris and pain on dorsal palpation. No hyperkeratotic lesions noted b/l.   Musculoskeletal Examination: Muscle strength 5/5 to b/l LE.  No pain, crepitus noted b/l. No gross pedal deformities. Patient ambulates independently without assistive aids.   Radiographs: None  Assessment/Plan: 1. Pain due to onychomycosis of toenails of both feet   2. Clotting disorder [facility])     Patient was evaluated and treated. All patient's and/or POA's questions/concerns addressed on today's visit. Mycotic toenails 1-5 debrided in length and girth without incident. Continue soft, supportive shoe gear daily. Report any pedal injuries to medical professional. Call office if there are any quesitons/concerns. -Patient/POA to call should there be question/concern in the interim.   Return in  about 3 months (around 09/20/2024).  Delon LITTIE Merlin, DPM      [place] LOCATION: [address], [place] [address]                   Office [phone]   [facility] LOCATION: [address] [place], [place] [address] Office [phone]

## 2024-06-23 ENCOUNTER — Encounter: Payer: Self-pay | Admitting: Podiatry

## 2024-07-02 DIAGNOSIS — H35373 Puckering of macula, bilateral: Secondary | ICD-10-CM | POA: Diagnosis not present

## 2024-07-02 DIAGNOSIS — H25813 Combined forms of age-related cataract, bilateral: Secondary | ICD-10-CM | POA: Diagnosis not present

## 2024-07-03 ENCOUNTER — Encounter (HOSPITAL_COMMUNITY): Payer: Self-pay | Admitting: Cardiology

## 2024-07-06 NOTE — Telephone Encounter (Signed)
 Application printed and left for provider

## 2024-07-13 DIAGNOSIS — C44719 Basal cell carcinoma of skin of left lower limb, including hip: Secondary | ICD-10-CM | POA: Diagnosis not present

## 2024-07-19 ENCOUNTER — Ambulatory Visit (HOSPITAL_COMMUNITY): Payer: Self-pay | Admitting: Cardiology

## 2024-07-19 ENCOUNTER — Encounter (HOSPITAL_COMMUNITY): Payer: Self-pay | Admitting: Cardiology

## 2024-07-19 ENCOUNTER — Ambulatory Visit (HOSPITAL_COMMUNITY)
Admission: RE | Admit: 2024-07-19 | Discharge: 2024-07-19 | Disposition: A | Discharge status: Home / Self Care | Source: Ambulatory Visit | Attending: Cardiology | Admitting: Cardiology

## 2024-07-19 VITALS — BP 110/70 | HR 81 | Wt 207.0 lb

## 2024-07-19 DIAGNOSIS — Z8673 Personal history of transient ischemic attack (TIA), and cerebral infarction without residual deficits: Secondary | ICD-10-CM | POA: Insufficient documentation

## 2024-07-19 DIAGNOSIS — E878 Other disorders of electrolyte and fluid balance, not elsewhere classified: Secondary | ICD-10-CM | POA: Insufficient documentation

## 2024-07-19 DIAGNOSIS — Z7902 Long term (current) use of antithrombotics/antiplatelets: Secondary | ICD-10-CM | POA: Insufficient documentation

## 2024-07-19 DIAGNOSIS — I10 Essential (primary) hypertension: Secondary | ICD-10-CM | POA: Diagnosis not present

## 2024-07-19 DIAGNOSIS — I2510 Atherosclerotic heart disease of native coronary artery without angina pectoris: Secondary | ICD-10-CM

## 2024-07-19 DIAGNOSIS — Z7982 Long term (current) use of aspirin: Secondary | ICD-10-CM | POA: Insufficient documentation

## 2024-07-19 DIAGNOSIS — I5022 Chronic systolic (congestive) heart failure: Secondary | ICD-10-CM

## 2024-07-19 DIAGNOSIS — A029 Salmonella infection, unspecified: Secondary | ICD-10-CM | POA: Insufficient documentation

## 2024-07-19 DIAGNOSIS — R011 Cardiac murmur, unspecified: Secondary | ICD-10-CM | POA: Insufficient documentation

## 2024-07-19 DIAGNOSIS — I252 Old myocardial infarction: Secondary | ICD-10-CM | POA: Insufficient documentation

## 2024-07-19 DIAGNOSIS — Z955 Presence of coronary angioplasty implant and graft: Secondary | ICD-10-CM | POA: Insufficient documentation

## 2024-07-19 DIAGNOSIS — E785 Hyperlipidemia, unspecified: Secondary | ICD-10-CM | POA: Insufficient documentation

## 2024-07-19 DIAGNOSIS — Z79899 Other long term (current) drug therapy: Secondary | ICD-10-CM | POA: Insufficient documentation

## 2024-07-19 LAB — LIPID PANEL
Cholesterol: 87 mg/dL (ref 0–200)
HDL: 41 mg/dL (ref >40)
LDL Cholesterol: 31 mg/dL (ref 0–99)
Total CHOL/HDL Ratio: 2.1 ratio
Triglycerides: 75 mg/dL (ref <150)
VLDL: 15 mg/dL (ref 0–40)

## 2024-07-19 LAB — BASIC METABOLIC PANEL WITH GFR
Anion gap: 9 (ref 5–15)
BUN: 24 mg/dL — ABNORMAL HIGH (ref 8–23)
CO2: 30 mmol/L (ref 22–32)
Calcium: 9.1 mg/dL (ref 8.9–10.3)
Chloride: 102 mmol/L (ref 98–111)
Creatinine, Ser: 0.93 mg/dL (ref 0.61–1.24)
GFR, Estimated: >60 mL/min (ref >60)
Glucose, Bld: 124 mg/dL — ABNORMAL HIGH (ref 70–99)
Potassium: 3.4 mmol/L — ABNORMAL LOW (ref 3.5–5.1)
Sodium: 141 mmol/L (ref 135–145)

## 2024-07-19 NOTE — Patient Instructions (Signed)
 There has been no changes to your medications.  Labs done today, your results will be available in MyChart, we will contact you for abnormal readings.  Your physician has requested that you have an echocardiogram. Echocardiography is a painless test that uses sound waves to create images of your heart. It provides your doctor with information about the size and shape of your heart and how well your heart's chambers and valves are working. This procedure takes approximately one hour. There are no restrictions for this procedure. Please do NOT wear cologne, perfume, aftershave, or lotions (deodorant is allowed). Please arrive 15 minutes prior to your appointment time.  Please note: We ask at that you not bring children with you during ultrasound (echo/ vascular) testing. Due to room size and safety concerns, children are not allowed in the ultrasound rooms during exams. Our front office staff cannot provide observation of children in our lobby area while testing is being conducted. An adult accompanying a patient to their appointment will only be allowed in the ultrasound room at the discretion of the ultrasound technician under special circumstances. We apologize for any inconvenience.  Your physician recommends that you schedule a follow-up appointment in: 6 months ( April 2026) ** PLEASE CALL THE OFFICE IN FEBRUARY 2026 TO ARRANGE YOUR FOLLOW UP APPOINTMENT.**  If you have any questions or concerns before your next appointment please send us  a message through [place] or call our office at [phone].    TO LEAVE A MESSAGE FOR THE NURSE SELECT OPTION 2, PLEASE LEAVE A MESSAGE INCLUDING: YOUR NAME DATE OF BIRTH CALL BACK NUMBER REASON FOR CALL**this is important as we prioritize the call backs  YOU WILL RECEIVE A CALL BACK THE SAME DAY AS LONG AS YOU CALL BEFORE 4:00 PM  At the [facility], you and your health needs are our priority. As part of our continuing mission to  provide you with exceptional heart care, we have created designated Provider Care Teams. These Care Teams include your primary Cardiologist (physician) and Advanced Practice Providers (APPs- Physician Assistants and Nurse Practitioners) who all work together to provide you with the care you need, when you need it.   You may see any of the following providers on your designated Care Team at your next follow up: Dr Toribio Fuel Dr Ezra Shuck Dr. Ria Commander Dr. Morene Brownie Amy Lenetta, NP Caffie Shed, [place] [facility], [place] Beckey Coe, NP [place] Lee, NP Ellouise Class, NP Tinnie Redman, PharmD Jaun Bash, PharmD   Please be sure to bring in all your medications bottles to every appointment.    Thank you for choosing [facility]

## 2024-07-19 NOTE — Progress Notes (Signed)
 Patient ID: Jose Meyer, male   DOB: [birth year], 78 y.o.   MRN: [id number]     [facility] NOTE  PCP: Dr. Arland Bolder Cardiology: Dr. Rolan  Chief Complaint: CAD  78 y.o. with past medical history of nonobstructive CAD, HTN, hyperlipidemia, HTN, CVA, and colon cancer s/p R hemicolectomy in 2009.    TEE in 9/10 showing no PFO, normal LV and RV systolic function, grade III plaque in the aortic arch/descending thoracic aorta. Patient has known nonobstructive CAD from cath in 2007.  Echo in 9/13 showed normal EF.    Patient had an inferior STEMI in 11/23 while on vacation in [place], [place].  He was taken to [facility] where inferior STEMI was noted.  He had cath with 99% stenosis proximal RCA treated with DES. Echo showed EF >70% with no WMAs, poor RV visualization.   Echo in 12/23 showed EF 65-70%, no wall motion abnormalities, normal RV.    Admitted 11/27/23 for Salmonella gastroenteritis, during admission developed VT, this was suspected to be due to electrolyte imbalance with profound diarrhea (low K, low Mg). Echo showed 65-70%, G1DD, normal RV, aortic valve sclerosis.  He returns for followup of CAD.  Weight stable.  SBP 100s-120s on home readings.  No exertional dyspnea or chest pain.  No lightheadedness. Tires more easily than in the past when walking up a hill or doing more strenuous activity.    ECG (personally reviewed): NSR, normal  Labs (10/24): LDL 32 Labs (3/25): K 3.9, creatinine 1.01 Labs (4/25): LDL 32, K 4, creatinine 0.82, hgb 14, AST/ALT normal, tbili 1.2 Labs (6/25): K 4, creatinine 0.94  Past Medical History: 1. Hyperlipidemia 2. Cerebellar CVA 2000: documented by MRI.  Possible TIA 7/10 with vertigo.  MRI 7/10 showed only small vessel disease.  Possible TIA 7/10.  Carotid dopplers (9/10) with no significant disease.  TEE (9/10): Normal LV and RV size and systolic function.  No PFO.  No significant valvular disease.  At least grade III plaque in the arch and  descending thoracic aorta with no ulceration or mobility.  3. HTN 4. History of colon CA s/p R hemicolectomy in 9/09.  5. psoriasis 6. diverticulosis 7. CAD: nonobstructive.  LHC in 2000 without significant disease.  ETT-cardiolite 2007 with fixed inferior defect.  LHC (4/07) showed EF 60%, 30-40% CFX stenosis, 30-40% mRCA stenosis.  Probably false positive cardiolite.  Echo (9/13): EF 55-60%, mild LVH, grade I diastolic dysfunction, mild to moderate LAE. - Cardiolite (2/18): Medium-sized, fixed inferolateral perfusion defect (likely artifact given normal wall motion), no ischemia, EF 64%.  - Inferior STEMI (11/23) with 99% proximal RCA treated with DES. Echo in 11/23 with EF > 70%, no WMAs, RV poorly visualized.  - Echo (2/25): EF 65-70%, normal RV, aortic valve sclerosis.  8. Palpitations: PVCs.  30 day event monitor in 9/13 with occasional PVCs but no more significant arrhythmias.   9. Chronic low back pain.  10. Plantar fasciitis 11. Psoriasis 12. Basal cell carcinoma 13. Salmonella gastroenteritis 2/25  Family History: No premature CAD.   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 Lives in [place].  No smoking since 1975.  Occasional ETOH.  Married with 2 children [facility], cardiologist in [place]).  Enjoys woodworking.  ROS: All systems reviewed and negative except as per HPI.   Current Outpatient Medications  Medication Sig Dispense Refill   aspirin  81 MG chewable tablet Chew 81 mg by mouth daily.  atorvastatin  (LIPITOR) 80 MG tablet Take 1 tablet (80 mg total) by mouth daily. 90 tablet 3   betamethasone  dipropionate 0.05 % lotion Apply 0.05 application topically as needed. For the scalp after shower  0   clopidogrel  (PLAVIX ) 75 MG tablet Take 1 tablet (75 mg total) by mouth daily. 90 tablet 3   ELIDEL 1 % cream Apply 1 application topically as needed. For skin  0   fluticasone (FLONASE) 50 MCG/ACT nasal spray  Place 1 spray into both nostrils as needed for allergies or rhinitis.     furosemide  (LASIX ) 20 MG tablet Take 1 tablet (20 mg total) by mouth daily as needed. 30 tablet 11   hydrochlorothiazide  (HYDRODIURIL ) 25 MG tablet Take 1 tablet (25 mg total) by mouth daily. 1 tablet in the morning 90 tablet 3   losartan  (COZAAR ) 25 MG tablet Take 0.5 tablets (12.5 mg total) by mouth daily. 90 tablet 3   methocarbamol (ROBAXIN) 500 MG tablet TK 1 T PO Q 6 H PRF BACK SPASMS     metoprolol  succinate (TOPROL -XL) 100 MG 24 hr tablet Take 0.5 tablets (50 mg total) by mouth every morning AND 1 tablet (100 mg total) every evening. Take with or immediately following a meal.. 135 tablet 3   semaglutide -weight management (WEGOVY ) 0.5 MG/0.5ML SOAJ SQ injection Inject 0.5 mg into the skin once a week.     silodosin (RAPAFLO) 8 MG CAPS capsule Take 8 mg by mouth daily with breakfast.     SYSTANE ULTRA 0.4-0.3 % SOLN      No current facility-administered medications for this encounter.    BP 110/70   Pulse 81   Wt 93.9 kg (207 lb)   SpO2 94%   BMI 28.07 kg/m   Filed Weights   07/19/24 1001  Weight: 93.9 kg (207 lb)    General: NAD Neck: No JVD, no thyromegaly or thyroid nodule.  Lungs: Clear to auscultation bilaterally with normal respiratory effort. CV: Nondisplaced PMI.  Heart regular S1/S2, no S3/S4, 2/6 SEM RUSB with clear S2.  No peripheral edema.  No carotid bruit.  Normal pedal pulses.  Abdomen: Soft, nontender, no hepatosplenomegaly, no distention.  Skin: Intact without lesions or rashes.  Neurologic: Alert and oriented x 3.  Psych: Normal affect. Extremities: No clubbing or cyanosis.  HEENT: Normal.   Assessment/Plan: 1. CAD: Inferior STEMI in 11/23 treated with DES to 99% stenosis proximal RCA, nonobstructive disease in LAD and LCx.  Based on history, suspect he may have had some RV involvement (hypotension initially requiring IV fluid then hypoxemic and treated with Lasix ). Echo at [facility] in  11/23 showed EF > 70% with no WMAs, but RV not well-visualized.  Echo in 2/25  showed EF 65-70%, normal RV.  No exertional chest pain or dyspnea.  - Continue Plavix  75 mg daily - Continue ASA 81 (he was on ASA 81 when he had his MI, so long-term regimen will be ASA 81 + Plavix ).  - Continue atorvastatin  80 daily, check lipids today.  - Hold off for now on SGLT2 inhibitor given bacteruria and BPH, follows with urology.  2. Hyperlipidemia: Goal LDL < 55. Check lipids today.    3. HTN: BP controlled.  - Continue Toprol  XL 50 qam/100 qpm - Continue losartan  12.5 daily.  - Continue hydrochlorothiazide  25 mg daily.  BMET today.  4. CVA: H/o CVA/TIA in the remote past. No recent neurological symptoms. 5. VT: Asymptomatic and self-limited. Noted in setting of electrolyte derangement with profuse Salmonella diarrhea.  No palpitations or lightheadedness.  6. Murmur: Aortic-area systolic murmur.  - I will get an echo to assess murmur.  Followup 6 months  I spent 31 minutes reviewing records, interviewing/examining patient, and managing orders.    Ezra Shuck,  07/19/2024

## 2024-07-25 ENCOUNTER — Encounter (HOSPITAL_BASED_OUTPATIENT_CLINIC_OR_DEPARTMENT_OTHER): Payer: Self-pay | Admitting: Pharmacist Clinician (PhC)/ Clinical Pharmacy Specialist

## 2024-07-27 DIAGNOSIS — H0288B Meibomian gland dysfunction left eye, upper and lower eyelids: Secondary | ICD-10-CM | POA: Diagnosis not present

## 2024-07-27 DIAGNOSIS — H52213 Irregular astigmatism, bilateral: Secondary | ICD-10-CM | POA: Diagnosis not present

## 2024-07-27 DIAGNOSIS — H0288A Meibomian gland dysfunction right eye, upper and lower eyelids: Secondary | ICD-10-CM | POA: Diagnosis not present

## 2024-07-27 DIAGNOSIS — H25813 Combined forms of age-related cataract, bilateral: Secondary | ICD-10-CM | POA: Diagnosis not present

## 2024-07-28 ENCOUNTER — Ambulatory Visit (HOSPITAL_COMMUNITY)
Admission: RE | Admit: 2024-07-28 | Discharge: 2024-07-28 | Disposition: A | Discharge status: Home / Self Care | Source: Ambulatory Visit | Attending: Cardiology | Admitting: Cardiology

## 2024-07-28 DIAGNOSIS — I5022 Chronic systolic (congestive) heart failure: Secondary | ICD-10-CM | POA: Insufficient documentation

## 2024-07-28 DIAGNOSIS — I082 Rheumatic disorders of both aortic and tricuspid valves: Secondary | ICD-10-CM | POA: Insufficient documentation

## 2024-07-28 LAB — ECHOCARDIOGRAM COMPLETE
AR max vel: 1.99 cm2
AV Area VTI: 1.91 cm2
AV Area mean vel: 1.82 cm2
AV Mean grad: 12 mmHg
AV Peak grad: 21.2 mmHg
Ao pk vel: 2.3 m/s
Area-P 1/2: 3.95 cm2
Est EF: >75
S' Lateral: 2.8 cm

## 2024-07-29 DIAGNOSIS — Z8673 Personal history of transient ischemic attack (TIA), and cerebral infarction without residual deficits: Secondary | ICD-10-CM | POA: Diagnosis not present

## 2024-07-29 DIAGNOSIS — R7303 Prediabetes: Secondary | ICD-10-CM | POA: Diagnosis not present

## 2024-07-29 DIAGNOSIS — I10 Essential (primary) hypertension: Secondary | ICD-10-CM | POA: Diagnosis not present

## 2024-07-29 DIAGNOSIS — E7800 Pure hypercholesterolemia, unspecified: Secondary | ICD-10-CM | POA: Diagnosis not present

## 2024-08-02 MED ORDER — WEGOVY 0.5 MG/0.5ML ~~LOC~~ SOAJ: 0.5000 mg | SUBCUTANEOUS | 0 refills | Status: DC

## 2024-08-23 ENCOUNTER — Encounter: Payer: Self-pay | Admitting: Podiatry

## 2024-08-23 ENCOUNTER — Ambulatory Visit: Admitting: Podiatry

## 2024-08-23 DIAGNOSIS — D689 Coagulation defect, unspecified: Secondary | ICD-10-CM

## 2024-08-23 DIAGNOSIS — M79674 Pain in right toe(s): Secondary | ICD-10-CM | POA: Diagnosis not present

## 2024-08-23 DIAGNOSIS — M79675 Pain in left toe(s): Secondary | ICD-10-CM

## 2024-08-23 DIAGNOSIS — B351 Tinea unguium: Secondary | ICD-10-CM | POA: Diagnosis not present

## 2024-08-28 ENCOUNTER — Other Ambulatory Visit (HOSPITAL_BASED_OUTPATIENT_CLINIC_OR_DEPARTMENT_OTHER): Payer: Self-pay | Admitting: Cardiology

## 2024-08-29 NOTE — Progress Notes (Signed)
  Subjective:  Patient ID: Jose Meyer, male    DOB: 03/12/1946,  MRN: [id number]  Jose Meyer presents to clinic today for at risk foot care with h/o clotting disorder and painful mycotic toenails of both feet that are difficult to trim. Pain interferes with daily activities and wearing enclosed shoe gear comfortably.  Chief Complaint  Patient presents with   Toe Pain    Dr. Kip is his PCP and his last visit was in April .   New problem(s): None.   PCP is Kip Righter, MD.  No Known Allergies  Review of Systems: Negative except as noted in the HPI.  Objective: No changes noted in today's physical examination. There were no vitals filed for this visit. Jose Meyer is a pleasant 78 y.o. male in NAD. AAO x 3.  Vascular Examination: Vascular status intact b/l with palpable pedal pulses. CFT immediate b/l. Pedal hair present. No edema. No pain with calf compression b/l. Skin temperature gradient WNL b/l. No varicosities noted. No cyanosis or clubbing noted.  Neurological Examination: Sensation grossly intact b/l with 10 gram monofilament. Vibratory sensation intact b/l.  Dermatological Examination: Pedal skin with normal turgor, texture and tone b/l. No open wounds nor interdigital macerations noted. Toenails 1-5 b/l thick, discolored, elongated with subungual debris and pain on dorsal palpation. No hyperkeratotic lesions noted b/l.   Musculoskeletal Examination: Muscle strength 5/5 to b/l LE.  No pain, crepitus noted b/l. No gross pedal deformities. Patient ambulates independently without assistive aids.   Radiographs: None Assessment/Plan: 1. Pain due to onychomycosis of toenails of both feet   2. Clotting disorder   Consent given for treatment. Patient examined. All patient's and/or POA's questions/concerns addressed on today's visit. Toenails 1-5 b/l debrided in length and girth without incident. Continue soft, supportive shoe gear daily. Report any pedal  injuries to medical professional. Call office if there are any questions/concerns. -Patient/POA to call should there be question/concern in the interim.   No follow-ups on file.  Jose Meyer, DPM      [place] LOCATION: [address], [place] [address]                   Office [phone]   [facility] LOCATION: [address] [place], [place] [address] Office [phone]

## 2024-08-31 DIAGNOSIS — H25813 Combined forms of age-related cataract, bilateral: Secondary | ICD-10-CM | POA: Diagnosis not present

## 2024-08-31 DIAGNOSIS — H268 Other specified cataract: Secondary | ICD-10-CM | POA: Diagnosis not present

## 2024-08-31 DIAGNOSIS — H2511 Age-related nuclear cataract, right eye: Secondary | ICD-10-CM | POA: Diagnosis not present

## 2024-09-02 DIAGNOSIS — D2272 Melanocytic nevi of left lower limb, including hip: Secondary | ICD-10-CM | POA: Diagnosis not present

## 2024-09-02 DIAGNOSIS — Z85828 Personal history of other malignant neoplasm of skin: Secondary | ICD-10-CM | POA: Diagnosis not present

## 2024-09-02 DIAGNOSIS — D2271 Melanocytic nevi of right lower limb, including hip: Secondary | ICD-10-CM | POA: Diagnosis not present

## 2024-09-02 DIAGNOSIS — L821 Other seborrheic keratosis: Secondary | ICD-10-CM | POA: Diagnosis not present

## 2024-09-02 DIAGNOSIS — D225 Melanocytic nevi of trunk: Secondary | ICD-10-CM | POA: Diagnosis not present

## 2024-09-02 DIAGNOSIS — C44519 Basal cell carcinoma of skin of other part of trunk: Secondary | ICD-10-CM | POA: Diagnosis not present

## 2024-09-02 DIAGNOSIS — L570 Actinic keratosis: Secondary | ICD-10-CM | POA: Diagnosis not present

## 2024-09-02 DIAGNOSIS — D485 Neoplasm of uncertain behavior of skin: Secondary | ICD-10-CM | POA: Diagnosis not present

## 2024-09-02 DIAGNOSIS — L905 Scar conditions and fibrosis of skin: Secondary | ICD-10-CM | POA: Diagnosis not present

## 2024-09-02 DIAGNOSIS — Z86018 Personal history of other benign neoplasm: Secondary | ICD-10-CM | POA: Diagnosis not present

## 2024-09-02 DIAGNOSIS — L578 Other skin changes due to chronic exposure to nonionizing radiation: Secondary | ICD-10-CM | POA: Diagnosis not present

## 2024-09-20 ENCOUNTER — Ambulatory Visit: Admitting: Podiatry

## 2024-09-27 ENCOUNTER — Other Ambulatory Visit (HOSPITAL_BASED_OUTPATIENT_CLINIC_OR_DEPARTMENT_OTHER): Payer: Self-pay | Admitting: Cardiology

## 2024-10-18 ENCOUNTER — Ambulatory Visit: Admitting: Podiatry

## 2024-10-18 ENCOUNTER — Encounter: Payer: Self-pay | Admitting: Podiatry

## 2024-10-18 DIAGNOSIS — M5416 Radiculopathy, lumbar region: Secondary | ICD-10-CM | POA: Diagnosis not present

## 2024-10-18 NOTE — Progress Notes (Signed)
" ° °  Chief Complaint  Patient presents with   Foot Pain    Patient is here for left foot pain possibly neuropathy    HPI: 79 y.o. male presenting today for intermittent neuropathic type pain to the left foot.  History of lumbar back pathology.  He describes the pain as pins-and-needles with shooting sensation to the dorsum of the foot mostly at night when he lays down.  The pain seems to be positional  Past Medical History:  Diagnosis Date   Colon cancer [facility])    s/p R hemicolectomy 06/2008   Coronary artery disease    Non-Obstructive;  LHC 2000 no sig dz; ETT-Cardiolite 2007 with fixed Inf defect;  LHC 4/07:  EF 60%, CFX 30-40%, mRCA 30-40% (prob false + CLite)   CVA (cerebral infarction)    cerebellar CVA 2000: doc by MRI; Poss TIA 7/10 with vertigo; MRI 7/10 => only small vessel disease; Carotids 9/10: no sig disease;  TEE 9/10: normal LV and RV size and systolic fxn, no PFO, no sig valvular dz, at least Gr III plaque in arch and desc thoracic aorta (no ulceration or mobility)   Diverticulosis    H/O echocardiogram    Echo 9/13: EF 55-60%, mod LVH, grade 1 diast dysfn, mild to mod LAE   Hyperlipidemia    Hypertension    Psoriasis     Past Surgical History:  Procedure Laterality Date   KNEE SURGERY Left 07/2014    No Known Allergies   Physical Exam: General: The patient is alert and oriented x3 in no acute distress.  Dermatology: Skin is warm, dry and supple bilateral lower extremities.   Vascular: Palpable pedal pulses bilaterally.  Delayed capillary refill however.    Neurological: Grossly intact via light touch  Musculoskeletal Exam: No pedal deformities noted.  Muscle strength 5/5 all compartments.  No tenderness with palpation   Assessment/Plan of Care: 1.  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  I do not suspect that the patient is  experiencing any type of ischemic pain or pathology associated to poor circulation - Patient continues to have chronic intermittent left foot pain mostly at night when he lays down.  He also has a long complicated history of back pathology.  I do believe that the patient's symptoms are likely associated to his lumbar pathology. -Referral placed for pain management, Dr. Wallie Sherry, for evaluation. Greatly appreciated -Return to clinic with me PRN      Thresa EMERSON Sar, DPM [facility]  Dr. Thresa EMERSON Sar, DPM    [address] [place], [place] [address]                Office [phone]  Fax [phone]     "

## 2024-10-20 ENCOUNTER — Other Ambulatory Visit (HOSPITAL_BASED_OUTPATIENT_CLINIC_OR_DEPARTMENT_OTHER): Payer: Self-pay | Admitting: Cardiology

## 2024-10-21 ENCOUNTER — Ambulatory Visit
Attending: Student in an Organized Health Care Education/Training Program | Admitting: Student in an Organized Health Care Education/Training Program

## 2024-10-21 ENCOUNTER — Encounter: Payer: Self-pay | Admitting: Student in an Organized Health Care Education/Training Program

## 2024-10-21 VITALS — BP 109/76 | HR 79 | Temp 98.2°F | Resp 16 | Ht 72.0 in | Wt 207.0 lb

## 2024-10-21 DIAGNOSIS — M792 Neuralgia and neuritis, unspecified: Secondary | ICD-10-CM | POA: Diagnosis present

## 2024-10-21 DIAGNOSIS — M79672 Pain in left foot: Secondary | ICD-10-CM | POA: Diagnosis present

## 2024-10-21 NOTE — Progress Notes (Signed)
 Safety precautions to be maintained throughout the outpatient stay will include: orient to surroundings, keep bed in low position, maintain call bell within reach at all times, provide assistance with transfer out of bed and ambulation.

## 2024-10-21 NOTE — Progress Notes (Signed)
 PROVIDER NOTE: Interpretation of information contained herein should be left to medically-trained personnel. Specific patient instructions are provided elsewhere under Patient Instructions section of medical record. This document was created in part using AI and STT-dictation technology, any transcriptional errors that may result from this process are unintentional.  Patient: Jose Meyer  Service: E/M Encounter  Provider: Wallie Sherry, MD  DOB: [birth year]  Delivery: Face-to-face  Specialty: Interventional Pain Management  MRN: [id number]  Setting: Ambulatory outpatient facility  Specialty designation: 09  Type: New Patient  Location: Outpatient office facility  PCP: Kip Righter, MD  DOS: 10/21/2024    Referring Prov.: Janit Thresa HERO, DPM   Primary Reason(s) for Visit: Encounter for initial evaluation of one or more chronic problems (new to examiner) potentially causing chronic pain, and posing a threat to normal musculoskeletal function. (Level of risk: High) CC: Foot Pain (Left)  HPI  Mr. Jose Meyer is a 79 y.o. year old, male patient, who comes for the first time to our practice referred by Janit Thresa HERO, DPM for our initial evaluation of his chronic pain. He has HYPERLIPIDEMIA-MIXED; Essential hypertension; CAD, NATIVE VESSEL; Cerebral artery occlusion with cerebral infarction (HCC); Transient cerebral ischemia; Cerebral infarction (HCC); Palpitations; Bilateral sensorineural hearing loss; Angina pectoris; Body mass index (BMI) 32.0-32.9, adult; ED (erectile dysfunction) of organic origin; History of malignant neoplasm of colon; Hypertensive retinopathy; Personal history of transient ischemic attack (TIA), and cerebral infarction without residual deficits; Pure hypercholesterolemia; Other abnormalities of gait and mobility; Prediabetes; Basal cell carcinoma of right lower leg; Basal cell carcinoma of abdomen; Benign neoplasm of skin of lower limb; History of malignant neoplasm of skin; History of  neoplasm; Melanocytic nevi of trunk; Neoplasm of uncertain behavior of skin; Porokeratosis; Psoriasis; Seborrheic dermatitis; Nasal mass; History of colonic polyps; Actinic skin damage; Actinic keratosis; Hypoxemia; Salmonella gastroenteritis; Prolonged QT interval; Ventricular tachycardia, nonsustained (HCC); Hypokalemia; Colitis; Coronary artery disease involving native coronary artery of native heart without angina pectoris; and NSVT (nonsustained ventricular tachycardia) (HCC) on their problem list. Today he comes in for evaluation of his Foot Pain (Left)  Pain Assessment: Location: Left Foot Radiating: Denies Onset: More than a month ago Duration: Chronic pain Quality: Tingling (Electrical impulse, tingle, wave sensation) Severity: 0-No pain/10 (subjective, self-reported pain score)  Effect on ADL: Limits a full night sleep due to pain Timing: Intermittent Modifying factors: Walking around once the pain hits A hot shower with pressure on lower back before going to ned BP: 109/76  HR: 79  Onset and Duration: Gradual, Date of onset: 08/2024, and Present less than 3 months Cause of pain: Unknown Severity: No change since onset, NAS-11 at its worse: 5/10, NAS-11 at its best: 0/10, NAS-11 now: 0/10, and NAS-11 on the average: 2/10 Timing: Night Aggravating Factors: Sleeping Alleviating Factors: Walking Associated Problems: Tingling and Pain that wakes patient up Quality of Pain: Tiring Previous Examinations or Tests: The patient denies previous tests  Previous Treatments: The patient denies previous treatments   Mr. Pound is being evaluated for possible interventional pain management therapies for the treatment of his chronic pain.    Discussed the use of AI scribe software for clinical note transcription with the patient, who gave verbal consent to proceed.  History of Present Illness   Jose Meyer is an 79 year old male who presents with nocturnal foot pain.  He  experiences tingling pain on the top of his left foot, between the ankle and the toes, occurring only at night after he has been asleep  for four to five hours, typically between 3 and 4 AM. The pain is intermittent and not present every night.  He denies any current back pain, although he has a history of back pain treated with cortisone injections at the L4-L5 level. He experiences muscle pain with exertion or prolonged bending, which he attributes to age, but does not have chronic back pain.  He has tried compression stockings at night without relief. Taking an extremely hot shower with water on his lower back before bed has occasionally prevented the pain, though he is unsure if this is coincidental.  No severe sensitivity in the affected area, and wearing socks does not bother him. He has no concerns about poor blood flow to the foot.      Meds  Current Medications[1]  Imaging Review  MR SHOULDER LEFT WO CONTRAST  Narrative CLINICAL DATA:  Left shoulder pain over the last 2 months  EXAM: MRI OF THE LEFT SHOULDER WITHOUT CONTRAST  TECHNIQUE: Multiplanar, multisequence MR imaging of the shoulder was performed. No intravenous contrast was administered.  COMPARISON:  Report from 07/21/2006  FINDINGS: Despite efforts by the technologist and patient, motion artifact is present on today's exam and could not be eliminated. This reduces exam sensitivity and specificity.  Rotator cuff: Full-thickness full width tear of the infraspinatus tendon is suspected but only with about 1.5 cm of retraction, and amorphous distal margin.  Full-thickness partial width tearing of almost the entirety of the supraspinatus tendon, with only a very small minority of the supraspinatus believed to attach anteriorly near the rotator interval. Most of the supraspinatus is retracted up to 4.5 cm.  Mild subscapularis tendinopathy.  Muscles:  Considerable infraspinatus minimal supraspinatus  atrophy.  Biceps long head: Mild to moderate tendinopathy of the intra-articular segment.  Acromioclavicular Joint: Mild spurring of the distal clavicle. Fluid from the subacromial subdeltoid bursa appears continuous with the [facility] joint. Type I acromion (previously reported as type 3 2007, likely indicating interval acromioplasty). As expected in this setting there is fluid in the subacromial subdeltoid bursa communicating with the glenohumeral joint.  Glenohumeral Joint: Small joint effusion. Moderate chondral thinning along the humeral head.  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 No collapse or acute fracture.  Anterior rotator cuff anchor tracks along the greater tuberosity of the humerus.  Other: No supplemental non-categorized findings.  IMPRESSION: 1. Full-thickness full width tear of the infraspinatus tendon with about 1.5 cm of retraction. 2. Full-thickness partial width tearing of almost the entirety of the supraspinatus tendon, with only a very small minority of the supraspinatus believed to attach anteriorly near the rotator interval. Most of the supraspinatus is retracted up to 4.5 cm. 3. Considerable infraspinatus and minimal supraspinatus atrophy. 4. Mild to moderate tendinopathy of the intra-articular segment of the biceps tendon. 5. 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 No collapse or acute fracture. 6. Moderate chondral thinning along the humeral head. 7. Small joint effusion. 8. Mild spurring of the distal clavicle.   Electronically Signed By: Ryan Salvage M.D. On: 09/08/2023 10:45   MR Lumbar Spine Wo Contrast  Narrative FINDINGS CLINICAL DATA:  LOW BACK PAIN RADIATING DOWN LEFT LEG, LEFT LEG NUMBNESS. MRI OF THE LUMBAR SPINE WITHOUT  CONTRAST MEDIA HIGH FIELD STRENGTH  IMAGES WERE ACQUIRED ACCORDING TO STANDARD PROTOCOL. DEGENERATIVE DISC DISEASE CHANGES, L1-2, L4-5  AND L5-S1 WITH  DISCAL DEHYDRATION AND MILD VERTEBRAL BODY OSTEOPHYTIC FORMATION.  NO STENOSIS EXCEPT FOR MODERATE STENOSIS  MEDIAL ASPECT OF THE LEFT L5- S1 FORAMEN.  SMALL CENTRAL AND LEFT PARACENTRAL HNP, L5-S1 CONTACTING THE LEFT S1 NERVE ROOT.  NO MASS EFFECT ON THE THECAL SAC PROBABLY RELATED TO THE FACT THAT THERE IS A PROMINENT AMOUNT OF EPIDURAL FAT AT L5-S1.  SMALL DISC PROTRUSION/BULGE IN THE MIDLINE AT L4-5.  ENCROACHMENT ON LEFT L5 NERVE ROOT IN THE LATERAL RECESS DUE TO ANNULAR BULGING  AND FACET HYPERTROPHY. IMPRESSION A SMALL HNP AT L5-S1 AND MINIMAL PROTRUSION AT L4-5 AS DESCRIBED ABOVE.  THERE MAY BE SLIGHT IRRITATION OF LEFT S1 NERVE ROOT BY THE SMALL HNP.  ALSO I AM CONCERNED THAT THERE IS MASS EFFECT ON THE LEFT L5 NERVE ROOT IN THE UPPER ASPECT OF THE  LEFT L5 LATERAL RECESS.  IF CLINICALLY DESIRED, MYELOGRAM, POST MYELOGRAM CT SCAN MAY BE HELPFUL FOR FURTHER IMAGING ASSESSMENT.    Complexity Note: Imaging results reviewed.                         ROS  Cardiovascular: Heart trouble, Daily Aspirin  intake, High blood pressure, Heart attack ( Date: 2023), Heart catheterization, and Blood thinners:  Anticoagulant Pulmonary or Respiratory: No reported pulmonary signs or symptoms such as wheezing and difficulty taking a deep full breath (Asthma), difficulty blowing air out (Emphysema), coughing up mucus (Bronchitis), persistent dry cough, or temporary stoppage of breathing during sleep Neurological: Stroke (Residual deficits or weakness: none) Psychological-Psychiatric: No reported psychological or psychiatric signs or symptoms such as difficulty sleeping, anxiety, depression, delusions or hallucinations (schizophrenial), mood swings (bipolar disorders) or suicidal ideations or attempts Gastrointestinal: Reflux or heatburn Genitourinary: No reported renal or genitourinary signs  or symptoms such as difficulty voiding or producing urine, peeing blood, non-functioning kidney, kidney stones, difficulty emptying the bladder, difficulty controlling the flow of urine, or chronic kidney disease Hematological: No reported hematological signs or symptoms such as prolonged bleeding, low or poor functioning platelets, bruising or bleeding easily, hereditary bleeding problems, low energy levels due to low hemoglobin or being anemic Endocrine: No reported endocrine signs or symptoms such as high or low blood sugar, rapid heart rate due to high thyroid levels, obesity or weight gain due to slow thyroid or thyroid disease Rheumatologic: No reported rheumatological signs and symptoms such as fatigue, joint pain, tenderness, swelling, redness, heat, stiffness, decreased range of motion, with or without associated rash Musculoskeletal: Negative for myasthenia gravis, muscular dystrophy, multiple sclerosis or malignant hyperthermia Work History: Retired  Allergies  Mr. Rosamond has no known allergies.  Laboratory Chemistry Profile   Renal Lab Results  Component Value Date   BUN 24 (H) 07/19/2024   CREATININE 0.93 07/19/2024   GFR 95.23 06/17/2012   GFRAA >60 07/01/2019   GFRNONAA >60 07/19/2024   PROTEINUR 30 (A) 11/27/2023     Electrolytes Lab Results  Component Value Date   NA 141 07/19/2024   K 3.4 (L) 07/19/2024   CL 102 07/19/2024   CALCIUM  9.1 07/19/2024   MG 2.2 12/15/2023     Hepatic Lab Results  Component Value Date   AST 23 03/17/2024   ALT 23 03/17/2024   ALBUMIN 3.4 (L) 03/17/2024   ALKPHOS 47 03/17/2024   LIPASE <10 (L) 11/27/2023     ID Lab Results  Component Value Date   SARSCOV2NAA NEGATIVE 11/27/2023     Bone No results found for: VD25OH, [id number], CI6874NY7,  CI7874NY7, 25OHVITD1, 25OHVITD2, 25OHVITD3, TESTOFREE, TESTOSTERONE   Endocrine Lab Results  Component Value Date   GLUCOSE 124 (H) 07/19/2024   GLUCOSEU NEGATIVE  11/27/2023   TSH 1.29 06/17/2012     Neuropathy No results found for: VITAMINB12, FOLATE, HGBA1C, HIV   CNS No results found for: COLORCSF, APPEARCSF, RBCCOUNTCSF, WBCCSF, POLYSCSF, LYMPHSCSF, EOSCSF, PROTEINCSF, GLUCCSF, JCVIRUS, CSFOLI, IGGCSF, LABACHR, ACETBL   Inflammation (CRP: Acute  ESR: Chronic) Lab Results  Component Value Date   LATICACIDVEN 1.7 11/27/2023     Rheumatology No results found for: RF, ANA, LABURIC, URICUR, LYMEIGGIGMAB, LYMEABIGMQN, HLAB27   Coagulation Lab Results  Component Value Date   INR 1.0 05/07/2009   LABPROT 13.6 05/07/2009   APTT 24 05/07/2009   PLT 137 (L) 11/29/2023     Cardiovascular Lab Results  Component Value Date   BNP 21.0 09/10/2022   CKTOTAL 146 05/07/2009   CKMB 3.8 05/07/2009   TROPONINI <0.30 02/16/2014   HGB 13.7 11/29/2023   HCT 40.5 11/29/2023     Screening Lab Results  Component Value Date   SARSCOV2NAA NEGATIVE 11/27/2023     Cancer Lab Results  Component Value Date   CEA 0.8 07/19/2008     Allergens No results found for: ALMOND, APPLE, ASPARAGUS, AVOCADO, BANANA, BARLEY, BASIL, BAYLEAF, GREENBEAN, LIMABEAN, WHITEBEAN, BEEFIGE, REDBEET, BLUEBERRY, BROCCOLI, CABBAGE, MELON, CARROT, CASEIN, CASHEWNUT, CAULIFLOWER, CELERY     Note: Lab results reviewed.  PFSH  Drug: Mr. Borchardt  reports no history of drug use. Alcohol:  reports that he does not currently use alcohol. Tobacco:  reports that he quit smoking about 51 years ago. His smoking use included cigarettes. He has never used smokeless tobacco. Medical:  has a past medical history of Colon cancer (HCC), Coronary artery disease, CVA (cerebral infarction), Diverticulosis, H/O echocardiogram, Hyperlipidemia, Hypertension, and Psoriasis. Family: family history includes Heart attack in his father; Heart disease in his father.  Past Surgical History:  Procedure  Laterality Date   KNEE SURGERY Left 07/2014   Active Ambulatory Problems    Diagnosis Date Noted   HYPERLIPIDEMIA-MIXED 06/26/2009   Essential hypertension 06/26/2009   CAD, NATIVE VESSEL 06/26/2009   Cerebral artery occlusion with cerebral infarction (HCC) 06/26/2009   Transient cerebral ischemia 06/26/2009   Cerebral infarction (HCC) 07/02/2011   Palpitations 07/19/2012   Bilateral sensorineural hearing loss 07/01/2017   Angina pectoris 11/06/2020   Body mass index (BMI) 32.0-32.9, adult 11/06/2020   ED (erectile dysfunction) of organic origin 11/06/2020   History of malignant neoplasm of colon 11/06/2020   Hypertensive retinopathy 11/06/2020   Personal history of transient ischemic attack (TIA), and cerebral infarction without residual deficits 11/06/2020   Pure hypercholesterolemia 11/06/2020   Other abnormalities of gait and mobility 02/19/2021   Prediabetes 02/19/2021   Basal cell carcinoma of right lower leg 09/01/2021   Basal cell carcinoma of abdomen 09/01/2021   Benign neoplasm of skin of lower limb 09/01/2021   History of malignant neoplasm of skin 09/01/2021   History of neoplasm 09/01/2021   Melanocytic nevi of trunk 09/01/2021   Neoplasm of uncertain behavior of skin 09/01/2021   Porokeratosis 09/01/2021   Psoriasis 09/01/2021   Seborrheic dermatitis 09/01/2021   Nasal mass 02/28/2022   History of colonic polyps 03/06/2022   Actinic skin damage 06/29/2022   Actinic keratosis 06/29/2022   Hypoxemia 09/02/2022   Salmonella gastroenteritis 11/27/2023   Prolonged QT interval 11/28/2023   Ventricular tachycardia, nonsustained (HCC) 11/28/2023   Hypokalemia 11/28/2023   Colitis 11/28/2023   Coronary artery  disease involving native coronary artery of native heart without angina pectoris 11/28/2023   NSVT (nonsustained ventricular tachycardia) (HCC) 11/28/2023   Resolved Ambulatory Problems    Diagnosis Date Noted   No Resolved Ambulatory Problems   Past Medical  History:  Diagnosis Date   Colon cancer (HCC)    Coronary artery disease    CVA (cerebral infarction)    Diverticulosis    H/O echocardiogram    Hyperlipidemia    Hypertension    Constitutional Exam  General appearance: Well nourished, well developed, and well hydrated. In no apparent acute distress Vitals:   10/21/24 1018  BP: 109/76  Pulse: 79  Resp: 16  Temp: 98.2 F (36.8 C)  TempSrc: Temporal  SpO2: 97%  Weight: 207 lb (93.9 kg)  Height: 6' (1.829 m)   BMI Assessment: Estimated body mass index is 28.07 kg/m as calculated from the following:   Height as of this encounter: 6' (1.829 m).   Weight as of this encounter: 207 lb (93.9 kg).  BMI interpretation table: BMI level Category Range association with higher incidence of chronic pain  <18 kg/m2 Underweight   18.5-24.9 kg/m2 Ideal body weight   25-29.9 kg/m2 Overweight Increased incidence by 20%  30-34.9 kg/m2 Obese (Class I) Increased incidence by 68%  35-39.9 kg/m2 Severe obesity (Class II) Increased incidence by 136%  >40 kg/m2 Extreme obesity (Class III) Increased incidence by 254%   Patient's current BMI Ideal Body weight  Body mass index is 28.07 kg/m. Ideal body weight: 77.6 kg (171 lb 1.2 oz) Adjusted ideal body weight: 84.1 kg (185 lb 7.1 oz)   BMI Readings from Last 4 Encounters:  10/21/24 28.07 kg/m  07/19/24 28.07 kg/m  03/15/24 28.07 kg/m  02/02/24 28.07 kg/m   Wt Readings from Last 4 Encounters:  10/21/24 207 lb (93.9 kg)  07/19/24 207 lb (93.9 kg)  03/15/24 207 lb (93.9 kg)  02/02/24 207 lb (93.9 kg)    Psych/Mental status: Alert, oriented x 3 (person, place, & time)       Eyes: PERLA Respiratory: No evidence of acute respiratory distress  Left foot slight discoloration  Full ROM  Assessment  Primary Diagnosis & Pertinent Problem List: The primary encounter diagnosis was Left foot pain. A diagnosis of Neuropathic pain of left foot was also pertinent to this visit.  Visit  Diagnosis (New problems to examiner): 1. Left foot pain   2. Neuropathic pain of left foot    Plan of Care (Initial workup plan)   Assessment and Plan    Left foot neuropathic pain   He experiences intermittent neuropathic pain in the left foot, located between the ankle and toes, primarily at night after 4-5 hours of sleep. The pain is a tingling sensation similar to a TENS unit, without associated back pain or severe sensitivity. It resolves with movement and hot showers, suggesting a link to inactivity and reduced blood flow during sleep. The differential diagnosis points to local nerve irritation due to poor blood flow rather than a spinal origin. Recommend using a pedal exerciser or performing knee raises for 5-10 minutes before bed to increase blood flow to the extremity. Advise keeping a diary of symptoms to track the effectiveness of the exercise regimen. If symptoms persist, consider obtaining imaging studies of the back to rule out spinal causes.        Provider-requested follow-up: Return for PRN.  Future Appointments  Date Time Provider Department Center  11/05/2024  9:45 AM Gaynel Delon CROME, DPM [facility]  01/20/2025  9:45 AM [place], Delon CROME, DPM [facility]   I discussed the assessment and treatment plan with the patient. The patient was provided an opportunity to ask questions and all were answered. The patient agreed with the plan and demonstrated an understanding of the instructions.  Patient advised to call back or seek an in-person evaluation if the symptoms or condition worsens.  I personally spent a total of in the care of the patient today including preparing to see the patient, getting/reviewing separately obtained history, performing a medically appropriate exam/evaluation, counseling and educating, placing orders, and documenting clinical information in the EHR.   Note by: Wallie Sherry, MD (TTS and AI technology used. I  apologize for any typographical errors that were not detected and corrected.) Date: 10/21/2024; Time: 10:50 AM     [1]  Current Outpatient Medications:    aspirin  81 MG chewable tablet, Chew 81 mg by mouth daily., Disp: , Rfl:    atorvastatin  (LIPITOR) 80 MG tablet, Take 1 tablet (80 mg total) by mouth daily., Disp: 90 tablet, Rfl: 3   betamethasone  dipropionate 0.05 % lotion, Apply 0.05 application topically as needed. For the scalp after shower, Disp: , Rfl: 0   clopidogrel  (PLAVIX ) 75 MG tablet, Take 1 tablet (75 mg total) by mouth daily., Disp: 90 tablet, Rfl: 3   ELIDEL 1 % cream, Apply 1 application topically as needed. For skin, Disp: , Rfl: 0   fluticasone (FLONASE) 50 MCG/ACT nasal spray, Place 1 spray into both nostrils as needed for allergies or rhinitis., Disp: , Rfl:    furosemide  (LASIX ) 20 MG tablet, Take 1 tablet (20 mg total) by mouth daily as needed., Disp: 30 tablet, Rfl: 11   hydrochlorothiazide  (HYDRODIURIL ) 25 MG tablet, Take 1 tablet (25 mg total) by mouth daily. 1 tablet in the morning, Disp: 90 tablet, Rfl: 3   losartan  (COZAAR ) 25 MG tablet, Take 0.5 tablets (12.5 mg total) by mouth daily., Disp: 90 tablet, Rfl: 3   methocarbamol (ROBAXIN) 500 MG tablet, TK 1 T PO Q 6 H PRF BACK SPASMS, Disp: , Rfl:    metoprolol  succinate (TOPROL -XL) 100 MG 24 hr tablet, Take 0.5 tablets (50 mg total) by mouth every morning AND 1 tablet (100 mg total) every evening. Take with or immediately following a meal.., Disp: 135 tablet, Rfl: 3   silodosin (RAPAFLO) 8 MG CAPS capsule, Take 8 mg by mouth daily with breakfast., Disp: , Rfl:    SYSTANE ULTRA 0.4-0.3 % SOLN, , Disp: , Rfl:    WEGOVY  1 MG/0.5ML SOAJ SQ injection, ADMINISTER 1 MG UNDER THE SKIN EVERY 7 DAYS, Disp: 2 mL, Rfl: 0

## 2024-10-21 NOTE — Patient Instructions (Signed)
 Try exercise peddler bike If not better, create 2nd visit

## 2024-10-22 ENCOUNTER — Other Ambulatory Visit (HOSPITAL_COMMUNITY): Payer: Self-pay | Admitting: Cardiology

## 2024-10-22 ENCOUNTER — Encounter (HOSPITAL_BASED_OUTPATIENT_CLINIC_OR_DEPARTMENT_OTHER): Payer: Self-pay | Admitting: Pharmacist Clinician (PhC)/ Clinical Pharmacy Specialist

## 2024-10-22 MED ORDER — WEGOVY 1.7 MG/0.75ML ~~LOC~~ SOAJ: 1.7000 mg | SUBCUTANEOUS | 0 refills | Status: DC

## 2024-10-22 NOTE — Addendum Note (Signed)
 Addended by: Levenia Skalicky D on: 10/22/2024 10:17 AM   Modules accepted: Orders

## 2024-10-26 ENCOUNTER — Other Ambulatory Visit (HOSPITAL_COMMUNITY): Payer: Self-pay

## 2024-10-26 MED ORDER — METOPROLOL SUCCINATE ER 100 MG PO TB24: ORAL_TABLET | ORAL | 3 refills | Status: AC

## 2024-10-28 ENCOUNTER — Encounter (HOSPITAL_BASED_OUTPATIENT_CLINIC_OR_DEPARTMENT_OTHER): Payer: Self-pay | Admitting: Pharmacist Clinician (PhC)/ Clinical Pharmacy Specialist

## 2024-10-28 ENCOUNTER — Encounter (HOSPITAL_COMMUNITY): Payer: Self-pay | Admitting: Cardiology

## 2024-11-05 ENCOUNTER — Encounter: Payer: Self-pay | Admitting: Podiatry

## 2024-11-05 ENCOUNTER — Ambulatory Visit: Admitting: Podiatry

## 2024-11-05 DIAGNOSIS — M79674 Pain in right toe(s): Secondary | ICD-10-CM

## 2024-11-05 DIAGNOSIS — M79675 Pain in left toe(s): Secondary | ICD-10-CM

## 2024-11-05 DIAGNOSIS — D689 Coagulation defect, unspecified: Secondary | ICD-10-CM | POA: Diagnosis not present

## 2024-11-05 DIAGNOSIS — B351 Tinea unguium: Secondary | ICD-10-CM

## 2024-11-09 ENCOUNTER — Other Ambulatory Visit (HOSPITAL_COMMUNITY): Payer: Self-pay | Admitting: Cardiology

## 2024-11-09 NOTE — Progress Notes (Signed)
"  °  Subjective:  Patient ID: Jose Meyer, male    DOB: Feb 05, 1946,  MRN: [id number]  Jose Meyer presents to clinic today for at risk foot care with h/o clotting disorder and painful thick toenails that are difficult to trim. Pain interferes with ambulation. Aggravating factors include wearing enclosed shoe gear. Pain is relieved with periodic professional debridement.  Chief Complaint  Patient presents with   Nail Problem    Denies being diabetic. He saw Dr. Kip in Oct   New problem(s): None.   PCP is Kip Righter, MD.  Allergies[1]  Review of Systems: Negative except as noted in the HPI.  Objective: No changes noted in today's physical examination. There were no vitals filed for this visit. Jose Meyer is a pleasant 79 y.o. male in NAD. AAO x 3.  Vascular Examination: Vascular status intact b/l with palpable pedal pulses. CFT immediate b/l. Pedal hair present. No edema. No pain with calf compression b/l. Skin temperature gradient WNL b/l. No varicosities noted. No cyanosis or clubbing noted.  Neurological Examination: Sensation grossly intact b/l with 10 gram monofilament. Vibratory sensation intact b/l.  Dermatological Examination: Pedal skin with normal turgor, texture and tone b/l. No open wounds nor interdigital macerations noted. Toenails 1-5 b/l thick, discolored, elongated with subungual debris and pain on dorsal palpation. No hyperkeratotic lesions noted b/l.   Musculoskeletal Examination: Muscle strength 5/5 to b/l LE.  No pain, crepitus noted b/l. No gross pedal deformities. Patient ambulates independently without assistive aids.   Radiographs: None  Assessment/Plan: 1. Pain due to onychomycosis of toenails of both feet   2. Clotting disorder     Patient was evaluated and treated. All patient's and/or POA's questions/concerns addressed on today's visit. Mycotic toenails 1-5 b/l debrided in length and girth without incident. Continue soft,  supportive shoe gear daily. Report any pedal injuries to medical professional. Call office if there are any quesitons/concerns. -Patient/POA to call should there be question/concern in the interim.   Return in about 9 weeks (around 01/07/2025).  Jose Meyer, DPM      [place] LOCATION: [address] [place] [address]                   Office [phone]   [facility] LOCATION: [address] [place], [place] [address] Office [phone]     [1] No Known Allergies  "

## 2024-11-15 ENCOUNTER — Other Ambulatory Visit (HOSPITAL_COMMUNITY): Payer: Self-pay | Admitting: Cardiology

## 2024-11-17 ENCOUNTER — Other Ambulatory Visit (HOSPITAL_BASED_OUTPATIENT_CLINIC_OR_DEPARTMENT_OTHER): Payer: Self-pay | Admitting: Cardiology

## 2024-11-18 NOTE — Telephone Encounter (Signed)
 Current weight 197 lbs no change in appetite at this dose. Due to weather not doing exercise wiling to go back to regular schedule (stationary bike 15-20 min)suggest to add some resistance exercise 2-3 times per week

## 2025-01-20 ENCOUNTER — Ambulatory Visit: Admitting: Podiatry

## 2025-02-04 ENCOUNTER — Ambulatory Visit: Admitting: Podiatry

## 2025-03-18 ENCOUNTER — Ambulatory Visit: Admitting: Podiatry
